# Patient Record
Sex: Female | Born: 1935
Health system: Southern US, Community
[De-identification: ages and names within clinical notes are randomized; demographics above are authoritative.]

## PROBLEM LIST (undated history)

## (undated) DIAGNOSIS — E785 Hyperlipidemia, unspecified: Secondary | ICD-10-CM

## (undated) DIAGNOSIS — G5600 Carpal tunnel syndrome, unspecified upper limb: Secondary | ICD-10-CM

## (undated) DIAGNOSIS — I2510 Atherosclerotic heart disease of native coronary artery without angina pectoris: Secondary | ICD-10-CM

## (undated) DIAGNOSIS — C4490 Unspecified malignant neoplasm of skin, unspecified: Secondary | ICD-10-CM

## (undated) DIAGNOSIS — I10 Essential (primary) hypertension: Secondary | ICD-10-CM

## (undated) DIAGNOSIS — M1990 Unspecified osteoarthritis, unspecified site: Secondary | ICD-10-CM

## (undated) HISTORY — DX: Atherosclerotic heart disease of native coronary artery without angina pectoris: I25.10

## (undated) HISTORY — DX: Essential (primary) hypertension: I10

## (undated) HISTORY — PX: PARTIAL HYSTERECTOMY: SHX80

## (undated) HISTORY — DX: Unspecified malignant neoplasm of skin, unspecified: C44.90

## (undated) HISTORY — DX: Carpal tunnel syndrome, unspecified upper limb: G56.00

## (undated) HISTORY — PX: BLADDER SURGERY: SHX569

## (undated) HISTORY — PX: CATARACT EXTRACTION: SUR2

## (undated) HISTORY — DX: Hyperlipidemia, unspecified: E78.5

---

## 1995-09-23 DIAGNOSIS — I10 Essential (primary) hypertension: Secondary | ICD-10-CM

## 1995-09-23 HISTORY — DX: Essential (primary) hypertension: I10

## 1997-03-04 HISTORY — PX: CARDIAC CATHETERIZATION: SHX172

## 1997-07-05 LAB — HM SIGMOIDOSCOPY: HM Sigmoidoscopy: NORMAL

## 1997-08-04 DIAGNOSIS — E785 Hyperlipidemia, unspecified: Secondary | ICD-10-CM

## 1997-08-04 HISTORY — DX: Hyperlipidemia, unspecified: E78.5

## 1997-08-26 ENCOUNTER — Encounter: Payer: Self-pay | Admitting: Family Medicine

## 1997-08-26 LAB — CONVERTED CEMR LAB: Pap Smear: NORMAL

## 1999-07-19 ENCOUNTER — Encounter: Admission: RE | Admit: 1999-07-19 | Discharge: 1999-08-03 | Payer: Self-pay | Admitting: Pain Medicine

## 1999-07-26 ENCOUNTER — Ambulatory Visit (HOSPITAL_COMMUNITY): Admission: RE | Admit: 1999-07-26 | Discharge: 1999-07-26 | Payer: Self-pay | Admitting: Orthopedic Surgery

## 2000-01-11 ENCOUNTER — Encounter: Admission: RE | Admit: 2000-01-11 | Discharge: 2000-01-11 | Payer: Self-pay | Admitting: General Surgery

## 2000-01-11 ENCOUNTER — Encounter: Payer: Self-pay | Admitting: General Surgery

## 2000-03-04 HISTORY — PX: BIOPSY BREAST: PRO8

## 2000-03-05 ENCOUNTER — Encounter: Payer: Self-pay | Admitting: Family Medicine

## 2000-03-05 ENCOUNTER — Other Ambulatory Visit: Admission: RE | Admit: 2000-03-05 | Discharge: 2000-03-05 | Payer: Self-pay | Admitting: Family Medicine

## 2000-03-05 LAB — CONVERTED CEMR LAB: Pap Smear: NORMAL

## 2000-03-19 ENCOUNTER — Encounter: Payer: Self-pay | Admitting: General Surgery

## 2000-03-19 ENCOUNTER — Encounter: Admission: RE | Admit: 2000-03-19 | Discharge: 2000-03-19 | Payer: Self-pay | Admitting: General Surgery

## 2000-04-01 ENCOUNTER — Encounter: Payer: Self-pay | Admitting: General Surgery

## 2000-04-01 ENCOUNTER — Encounter: Admission: RE | Admit: 2000-04-01 | Discharge: 2000-04-01 | Payer: Self-pay | Admitting: General Surgery

## 2000-04-02 ENCOUNTER — Ambulatory Visit (HOSPITAL_BASED_OUTPATIENT_CLINIC_OR_DEPARTMENT_OTHER): Admission: RE | Admit: 2000-04-02 | Discharge: 2000-04-02 | Payer: Self-pay | Admitting: General Surgery

## 2000-04-02 ENCOUNTER — Encounter (INDEPENDENT_AMBULATORY_CARE_PROVIDER_SITE_OTHER): Payer: Self-pay | Admitting: Specialist

## 2000-04-14 ENCOUNTER — Encounter: Payer: Self-pay | Admitting: Family Medicine

## 2000-04-14 LAB — CONVERTED CEMR LAB: Hgb A1c MFr Bld: 5.7 %

## 2001-12-01 ENCOUNTER — Other Ambulatory Visit: Admission: RE | Admit: 2001-12-01 | Discharge: 2001-12-01 | Payer: Self-pay | Admitting: Family Medicine

## 2001-12-01 ENCOUNTER — Encounter: Payer: Self-pay | Admitting: Family Medicine

## 2002-01-13 ENCOUNTER — Encounter: Admission: RE | Admit: 2002-01-13 | Discharge: 2002-01-13 | Payer: Self-pay | Admitting: Family Medicine

## 2002-01-13 ENCOUNTER — Encounter: Payer: Self-pay | Admitting: Family Medicine

## 2002-08-02 ENCOUNTER — Ambulatory Visit (HOSPITAL_COMMUNITY): Admission: RE | Admit: 2002-08-02 | Discharge: 2002-08-02 | Payer: Self-pay | Admitting: Ophthalmology

## 2003-01-24 ENCOUNTER — Encounter: Admission: RE | Admit: 2003-01-24 | Discharge: 2003-01-24 | Payer: Self-pay | Admitting: General Surgery

## 2003-01-24 ENCOUNTER — Encounter: Payer: Self-pay | Admitting: Family Medicine

## 2004-01-25 ENCOUNTER — Encounter: Admission: RE | Admit: 2004-01-25 | Discharge: 2004-01-25 | Payer: Self-pay | Admitting: Family Medicine

## 2004-10-05 ENCOUNTER — Ambulatory Visit: Payer: Self-pay | Admitting: Family Medicine

## 2004-11-06 ENCOUNTER — Ambulatory Visit: Payer: Self-pay | Admitting: Family Medicine

## 2005-02-07 ENCOUNTER — Encounter: Admission: RE | Admit: 2005-02-07 | Discharge: 2005-02-07 | Payer: Self-pay | Admitting: Family Medicine

## 2005-05-28 ENCOUNTER — Ambulatory Visit: Payer: Self-pay | Admitting: Family Medicine

## 2005-06-03 ENCOUNTER — Ambulatory Visit: Payer: Self-pay | Admitting: Family Medicine

## 2005-07-01 ENCOUNTER — Ambulatory Visit: Payer: Self-pay | Admitting: Family Medicine

## 2005-09-06 ENCOUNTER — Ambulatory Visit: Payer: Self-pay | Admitting: Family Medicine

## 2005-11-07 ENCOUNTER — Ambulatory Visit: Payer: Self-pay | Admitting: Family Medicine

## 2005-12-03 ENCOUNTER — Ambulatory Visit: Payer: Self-pay | Admitting: Family Medicine

## 2006-02-25 ENCOUNTER — Encounter: Admission: RE | Admit: 2006-02-25 | Discharge: 2006-02-25 | Payer: Self-pay | Admitting: Family Medicine

## 2006-06-25 ENCOUNTER — Ambulatory Visit: Payer: Self-pay | Admitting: Family Medicine

## 2006-07-03 ENCOUNTER — Ambulatory Visit: Payer: Self-pay | Admitting: Cardiology

## 2006-07-09 ENCOUNTER — Ambulatory Visit: Payer: Self-pay

## 2006-08-13 ENCOUNTER — Ambulatory Visit: Payer: Self-pay | Admitting: Family Medicine

## 2007-03-25 ENCOUNTER — Encounter: Admission: RE | Admit: 2007-03-25 | Discharge: 2007-03-25 | Payer: Self-pay | Admitting: Family Medicine

## 2007-03-26 ENCOUNTER — Encounter: Payer: Self-pay | Admitting: Family Medicine

## 2007-05-13 ENCOUNTER — Encounter: Payer: Self-pay | Admitting: Family Medicine

## 2007-05-13 DIAGNOSIS — Z87898 Personal history of other specified conditions: Secondary | ICD-10-CM | POA: Insufficient documentation

## 2007-05-13 DIAGNOSIS — I2510 Atherosclerotic heart disease of native coronary artery without angina pectoris: Secondary | ICD-10-CM | POA: Insufficient documentation

## 2007-05-13 DIAGNOSIS — I10 Essential (primary) hypertension: Secondary | ICD-10-CM | POA: Insufficient documentation

## 2007-05-13 DIAGNOSIS — E785 Hyperlipidemia, unspecified: Secondary | ICD-10-CM | POA: Insufficient documentation

## 2007-05-13 DIAGNOSIS — Z8739 Personal history of other diseases of the musculoskeletal system and connective tissue: Secondary | ICD-10-CM | POA: Insufficient documentation

## 2007-05-13 DIAGNOSIS — M109 Gout, unspecified: Secondary | ICD-10-CM | POA: Insufficient documentation

## 2007-05-13 DIAGNOSIS — Q838 Other congenital malformations of breast: Secondary | ICD-10-CM | POA: Insufficient documentation

## 2007-05-14 ENCOUNTER — Ambulatory Visit: Payer: Self-pay | Admitting: Family Medicine

## 2007-09-04 ENCOUNTER — Ambulatory Visit: Payer: Self-pay | Admitting: Family Medicine

## 2007-09-04 DIAGNOSIS — M5430 Sciatica, unspecified side: Secondary | ICD-10-CM | POA: Insufficient documentation

## 2007-11-26 ENCOUNTER — Encounter: Payer: Self-pay | Admitting: Family Medicine

## 2008-03-25 ENCOUNTER — Encounter: Admission: RE | Admit: 2008-03-25 | Discharge: 2008-03-25 | Payer: Self-pay | Admitting: Family Medicine

## 2008-03-30 ENCOUNTER — Encounter (INDEPENDENT_AMBULATORY_CARE_PROVIDER_SITE_OTHER): Payer: Self-pay | Admitting: *Deleted

## 2008-06-23 ENCOUNTER — Ambulatory Visit: Payer: Self-pay | Admitting: Family Medicine

## 2008-06-23 DIAGNOSIS — M79609 Pain in unspecified limb: Secondary | ICD-10-CM | POA: Insufficient documentation

## 2008-08-01 ENCOUNTER — Ambulatory Visit: Payer: Self-pay | Admitting: Family Medicine

## 2008-09-22 ENCOUNTER — Ambulatory Visit: Payer: Self-pay | Admitting: Family Medicine

## 2008-12-22 ENCOUNTER — Ambulatory Visit: Payer: Self-pay | Admitting: Family Medicine

## 2009-03-22 ENCOUNTER — Ambulatory Visit: Payer: Self-pay | Admitting: Family Medicine

## 2009-03-27 ENCOUNTER — Encounter: Admission: RE | Admit: 2009-03-27 | Discharge: 2009-03-27 | Payer: Self-pay | Admitting: Family Medicine

## 2009-03-28 ENCOUNTER — Encounter (INDEPENDENT_AMBULATORY_CARE_PROVIDER_SITE_OTHER): Payer: Self-pay | Admitting: *Deleted

## 2009-06-22 ENCOUNTER — Ambulatory Visit: Payer: Self-pay | Admitting: Family Medicine

## 2009-09-12 ENCOUNTER — Ambulatory Visit: Payer: Self-pay | Admitting: Family Medicine

## 2009-09-20 ENCOUNTER — Ambulatory Visit: Payer: Self-pay | Admitting: Family Medicine

## 2009-10-05 ENCOUNTER — Telehealth: Payer: Self-pay | Admitting: Family Medicine

## 2009-12-20 ENCOUNTER — Ambulatory Visit: Payer: Self-pay | Admitting: Family Medicine

## 2009-12-20 LAB — CONVERTED CEMR LAB
CO2: 34 meq/L — ABNORMAL HIGH (ref 19–32)
Chloride: 103 meq/L (ref 96–112)
Creatinine, Ser: 1.4 mg/dL — ABNORMAL HIGH (ref 0.4–1.2)
Glucose, Bld: 148 mg/dL — ABNORMAL HIGH (ref 70–99)
Potassium: 4.1 meq/L (ref 3.5–5.1)
Sodium: 140 meq/L (ref 135–145)

## 2009-12-25 ENCOUNTER — Ambulatory Visit: Payer: Self-pay | Admitting: Family Medicine

## 2009-12-25 DIAGNOSIS — R5383 Other fatigue: Secondary | ICD-10-CM

## 2009-12-25 DIAGNOSIS — R5381 Other malaise: Secondary | ICD-10-CM | POA: Insufficient documentation

## 2010-03-29 ENCOUNTER — Encounter: Admission: RE | Admit: 2010-03-29 | Discharge: 2010-03-29 | Payer: Self-pay | Admitting: Family Medicine

## 2010-03-29 LAB — HM MAMMOGRAPHY

## 2010-04-09 ENCOUNTER — Ambulatory Visit: Payer: Self-pay | Admitting: Family Medicine

## 2010-04-09 LAB — CONVERTED CEMR LAB
ALT: 17 units/L (ref 0–35)
AST: 17 units/L (ref 0–37)
BUN: 25 mg/dL — ABNORMAL HIGH (ref 6–23)
Bilirubin, Direct: 0.1 mg/dL (ref 0.0–0.3)
Calcium: 9.3 mg/dL (ref 8.4–10.5)
Chloride: 104 meq/L (ref 96–112)
Direct LDL: 126.4 mg/dL
Eosinophils Absolute: 0.3 10*3/uL (ref 0.0–0.7)
Hemoglobin: 12.1 g/dL (ref 12.0–15.0)
Lymphocytes Relative: 19.9 % (ref 12.0–46.0)
Lymphs Abs: 1.4 10*3/uL (ref 0.7–4.0)
MCHC: 34.5 g/dL (ref 30.0–36.0)
Monocytes Absolute: 0.8 10*3/uL (ref 0.1–1.0)
Monocytes Relative: 10.9 % (ref 3.0–12.0)
Platelets: 323 10*3/uL (ref 150.0–400.0)
Potassium: 4.5 meq/L (ref 3.5–5.1)
RBC: 3.82 M/uL — ABNORMAL LOW (ref 3.87–5.11)
Total Bilirubin: 0.6 mg/dL (ref 0.3–1.2)
Triglycerides: 251 mg/dL — ABNORMAL HIGH (ref 0.0–149.0)
VLDL: 50.2 mg/dL — ABNORMAL HIGH (ref 0.0–40.0)

## 2010-04-10 LAB — CONVERTED CEMR LAB: Vit D, 25-Hydroxy: 46 ng/mL (ref 30–89)

## 2010-04-11 ENCOUNTER — Ambulatory Visit: Payer: Self-pay | Admitting: Family Medicine

## 2010-06-07 ENCOUNTER — Encounter (INDEPENDENT_AMBULATORY_CARE_PROVIDER_SITE_OTHER): Payer: Self-pay | Admitting: *Deleted

## 2010-08-21 ENCOUNTER — Ambulatory Visit: Payer: Self-pay | Admitting: Family Medicine

## 2010-08-21 DIAGNOSIS — B029 Zoster without complications: Secondary | ICD-10-CM | POA: Insufficient documentation

## 2010-08-29 ENCOUNTER — Telehealth: Payer: Self-pay | Admitting: Family Medicine

## 2010-11-22 ENCOUNTER — Encounter: Payer: Self-pay | Admitting: Family Medicine

## 2010-12-04 NOTE — Assessment & Plan Note (Signed)
Summary: CPX / LFW   Vital Signs:  Patient profile:   77 year old female Weight:      188.25 pounds Temp:     98.8 degrees F oral Pulse rate:   96 / minute Pulse rhythm:   regular BP sitting:   118 / 70  (left arm) Cuff size:   large  Vitals Entered By: Sydell Axon LPN (April 11, 6961 3:00 PM) CC: 30 Minute checkup, has had a hysterectomy   History of Present Illness: Pt here for followup. She is feeling well and has a pain under her right shoulder blade which happens after she pulls weeds, etc.  She gets exhausted very easily. She pulled weeds for about 2 hours from 8-10 this AM, then laid down.  Preventive Screening-Counseling & Management  Alcohol-Tobacco     Alcohol drinks/day: 0     Smoking Status: never  Caffeine-Diet-Exercise     Caffeine use/day: 1-2     Does Patient Exercise: no  Problems Prior to Update: 1)  Fatigue  (ICD-780.79) 2)  Need Prophylactic Vaccination&inoculation Flu  (ICD-V04.81) 3)  Heel Pain, Bilateral  (ICD-729.5) 4)  Sciatica, Left  (ICD-724.3) 5)  Dystrophic Nails, Fingers  (ICD-703.8) 6)  Fibrocystic Breast Disease, Hx of  (ICD-V13.9) 7)  Anomaly, Congenital, Breast Nec  (ICD-757.6) 8)  Cad  (ICD-414.00) 9)  Hx, Personal, Arthritis  (ICD-V13.4) 10)  Hypertension  (ICD-401.9) 11)  Hyperlipidemia  (ICD-272.4) 12)  Gout  (ICD-274.9)  Medications Prior to Update: 1)  Bayer Aspirin 325 Mg  Tabs (Aspirin) .Marland Kitchen.. 1 By Mouth Once Daily 2)  Accupril 40 Mg  Tabs (Quinapril Hcl) .Marland Kitchen.. 1 By Mouth Daily 3)  Bl Vitamin C 500 Mg  Tabs (Ascorbic Acid) .Marland Kitchen.. 1000 Mg. Daily 4)  Caltrate 600 1500 Mg  Tabs (Calcium Carbonate) .... Once Daily   Tums 3 A Day 5)  Cvs Vitamin E 400 Unit  Caps (Vitamin E) .... Once Daily 6)  Thera Tears Nutrition   Caps (Dha-Epa-Flaxseed Oil-Vitamin E) .... 3 Drops Four Times A Day, Left Eye 7)  Bl Flax Seed Oil 1000 Mg  Caps (Flaxseed (Linseed)) .... Once Daily 8)  Omeprazole 20 Mg  Cpdr (Omeprazole) .... One Tab By Mouth 45  Minutes Before Breakfast 9)  Fish Oil   Oil (Fish Oil) .... Two Times A Day 10)  Multivitamins   Tabs (Multiple Vitamin) .... Once Daily 11)  Amlodipine Besylate 10 Mg Tabs (Amlodipine Besylate) .... Take 1/2  By Mouth At Night 12)  Maxzide-25 37.5-25 Mg Tabs (Triamterene-Hctz) .... One Tab By Mouth in Am.  Allergies: No Known Drug Allergies  Family History: Father:  DECEASED 34 YOA CANCER OF COLON Mother:  DECEASED 64 YOA CANCER BREAST/ KIDNEY FAILURE Siblings: 2 BROTHERS : 1 DECEASED BLADDER CANCER 1 BROTHER DECEASED LUNG CANCER 3 SISTERS : 2 SISTER HTN CAD: +BROTHER DM : NEGATIVE  HTN: + SELF SISTER/ BROTHER STROKE : NEGATIVE  CANCER: +FATHER COLON CANCER/ + MOTHER BREAST CANCER BROTHER X 2/ 1 WITH BLADDER CANCER/ 1 BROTHER WITH LUNG  Social History: Smoking Status:  never Caffeine use/day:  1-2 Does Patient Exercise:  no  Review of Systems General:  Denies chills, fatigue, fever, sweats, weakness, and weight loss. Eyes:  Denies blurring, discharge, and eye pain; recent eye exam. ENT:  Denies decreased hearing, earache, and ringing in ears. CV:  Complains of chest pain or discomfort; denies palpitations and shortness of breath with exertion; occas with pulling weeds/grass. Resp:  Denies cough, shortness of  breath, and wheezing. GI:  Denies abdominal pain, bloody stools, change in bowel habits, constipation, dark tarry stools, diarrhea, indigestion, loss of appetite, nausea, vomiting, vomiting blood, and yellowish skin color; well controlled GERD, Rarely takes Omeprazole.. GU:  Complains of nocturia; denies discharge, dysuria, and urinary frequency; 1-2. MS:  Complains of joint pain; denies low back pain, muscle aches, muscle weakness, and stiffness; hands occas.. Derm:  Denies dryness, itching, and rash; sees Derm, just saw him yesterday.. Neuro:  Denies numbness, poor balance, tingling, and tremors.  Physical Exam  General:  Well-developed,well-nourished,in no acute  distress; alert,appropriate and cooperative throughout examination, in good spirits.. Head:  Normocephalic and atraumatic without obvious abnormalities. No apparent alopecia or balding. Sinuses NT. Eyes:  Conjunctiva clear bilaterally.  Ears:  External ear exam shows no significant lesions or deformities.  Otoscopic examination reveals clear canals, tympanic membranes are intact bilaterally without bulging, retraction, inflammation or discharge. Hearing is grossly normal bilaterally. Nose:  External nasal examination shows no deformity or inflammation. Nasal mucosa are pink and moist without lesions or exudates. Mild swelling of left nostril with clear discharge. Mouth:  Oral mucosa and oropharynx without lesions or exudates.  Teeth in good repair. Neck:  No deformities, masses, or tenderness noted. Chest Wall:  No deformities, masses, or tenderness noted. Breasts:  No mass, nodules, thickening, tenderness, bulging, retraction, inflamation, nipple discharge or skin changes noted.   Lungs:  Normal respiratory effort, chest expands symmetrically. Lungs are clear to auscultation, no crackles or wheezes. Heart:  Normal rate and regular rhythm. S1 and S2 normal without gallop, murmur, click, rub or other extra sounds. Abdomen:  Bowel sounds positive,abdomen soft and non-tender without masses, organomegaly or hernias noted. Rectal:  No external abnormalities noted. Normal sphincter tone. No rectal masses or tenderness. G neg. Genitalia:  Bimanual only done Introitus wnl, Uterus and Cervix absent, Adnexa nontender w/o mass, ovaries not felt.  Msk:  No deformity or scoliosis noted of thoracic or lumbar spine.  Mild discomfort with ext rotation of the right shoulder. Pulses:  R and L carotid,radial,femoral,dorsalis pedis and posterior tibial pulses are full and equal bilaterally Extremities:  No clubbing, cyanosis, edema, or deformity noted with normal full range of motion of all joints.   Neurologic:  No  cranial nerve deficits noted. Station and gait are normal. Plantar reflexes are down-going bilaterally. DTRs are symmetrical throughout. Sensory, motor and coordinative functions appear intact. Skin:  Intact without suspicious lesions or rashes Cervical Nodes:  No lymphadenopathy noted Axillary Nodes:  No palpable lymphadenopathy Inguinal Nodes:  No significant adenopathy Psych:  Cognition and judgment appear intact. Alert and cooperative with normal attention span and concentration. No apparent delusions, illusions, hallucinations   Impression & Recommendations:  Problem # 1:  FIBROCYSTIC BREAST DISEASE, HX OF (ICD-V13.9) Recent mammo ok. Breaqst exam today benign.  Problem # 2:  CAD (ICD-414.00)  Sounds stable but may need eval in future, having what sounds like stable angina. Her updated medication list for this problem includes:    Bayer Aspirin 325 Mg Tabs (Aspirin) .Marland Kitchen... 1 by mouth once daily    Accupril 40 Mg Tabs (Quinapril hcl) .Marland Kitchen... 1 by mouth daily    Amlodipine Besylate 10 Mg Tabs (Amlodipine besylate) .Marland Kitchen... Take 1/2  by mouth at night    Maxzide-25 37.5-25 Mg Tabs (Triamterene-hctz) ..... One tab by mouth in am.  Labs Reviewed: Chol: 226 (04/09/2010)   HDL: 36.60 (04/09/2010)   TG: 251.0 (04/09/2010)  Problem # 3:  HX, PERSONAL, ARTHRITIS (ICD-V13.4)  Assessment: Unchanged Generalized aches and pains but nothing worsened acutely.  Problem # 4:  HYPERTENSION (ICD-401.9) Assessment: Unchanged  Stable and well controlled. Her updated medication list for this problem includes:    Accupril 40 Mg Tabs (Quinapril hcl) .Marland Kitchen... 1 by mouth daily    Amlodipine Besylate 10 Mg Tabs (Amlodipine besylate) .Marland Kitchen... Take 1/2  by mouth at night    Maxzide-25 37.5-25 Mg Tabs (Triamterene-hctz) ..... One tab by mouth in am.  BP today: 118/70 Prior BP: 112/68 (12/25/2009)  Labs Reviewed: K+: 4.5 (04/09/2010) Creat: : 1.5 (04/09/2010)   Chol: 226 (04/09/2010)   HDL: 36.60 (04/09/2010)    TG: 251.0 (04/09/2010)  Orders: Prescription Created Electronically [phone])  Problem # 5:  HYPERLIPIDEMIA (ICD-272.4) Assessment: Unchanged  Labs Reviewed: SGOT: 17 (04/09/2010)   SGPT: 17 (04/09/2010)   HDL:36.60 (04/09/2010)  Chol:226 (04/09/2010)  Trig:251.0 (04/09/2010)  LDL 126  Problem # 6:  GOUT (ICD-274.9) Assessment: Unchanged Stable. Elevate extremity; warm compresses, symptomatic relief and medication as directed.   Complete Medication List: 1)  Bayer Aspirin 325 Mg Tabs (Aspirin) .Marland Kitchen.. 1 by mouth once daily 2)  Accupril 40 Mg Tabs (Quinapril hcl) .Marland Kitchen.. 1 by mouth daily 3)  Bl Vitamin C 500 Mg Tabs (Ascorbic acid) .Marland Kitchen.. 1000 mg. daily 4)  Caltrate 600 1500 Mg Tabs (Calcium carbonate) .... Once daily   tums 3 a day 5)  Cvs Vitamin E 400 Unit Caps (Vitamin e) .... Once daily 6)  Thera Tears Nutrition Caps (Dha-epa-flaxseed oil-vitamin e) .... 3 drops daily, left eye 7)  Bl Flax Seed Oil 1000 Mg Caps (Flaxseed (linseed)) .... Once daily 8)  Omeprazole 20 Mg Cpdr (Omeprazole) .... One tab by mouth 45 minutes before breakfast 9)  Fish Oil Oil (Fish oil) .... Two times a day 10)  Multivitamins Tabs (Multiple vitamin) .... Once daily 11)  Amlodipine Besylate 10 Mg Tabs (Amlodipine besylate) .... Take 1/2  by mouth at night 12)  Maxzide-25 37.5-25 Mg Tabs (Triamterene-hctz) .... One tab by mouth in am.  Patient Instructions: 1)  RTC as needed. Prescriptions: MAXZIDE-25 37.5-25 MG TABS (TRIAMTERENE-HCTZ) one tab by mouth in AM.  #90 x 1   Entered by:   Sydell Axon LPN   Authorized by:   Shaune Leeks MD   Signed by:   Sydell Axon LPN on 21/30/8657   Method used:   Electronically to        [facility] [address] (retail)       [address], [address]       [place], [place]  [address]       Ph: [phone]       Fax: [phone]   RxID:   [id number]   Current Allergies (reviewed today): No known allergies

## 2010-12-04 NOTE — Letter (Signed)
Summary: Nadara Eaton letter  [place] [facility]  [address] [place], [place] [address]   Phone: [phone]  Fax: [phone]       06/07/2010 MRN: [id number]  KISHIA SHACKETT [address] [place], [place]  [address]  Dear Ms. [facility] [place] and [place] announce the retirement of Arta Silence, M.D., from full-time practice at the [facility] office effective May 03, 2010 and his plans of returning part-time.  It is important to Dr. Hetty Ely and to our practice that you understand that [facility] has seven physicians in our office for your health care needs.  We will continue to offer the same exceptional care that you have today.    Dr. Hetty Ely has spoken to many of you about his plans for retirement and returning part-time in the fall.   We will continue to work with you through the transition to schedule appointments for you in the office and meet the high standards that [facility] is committed to.   Again, it is with great pleasure that we share the news that Dr. Hetty Ely will return to [facility] in October of 2011 with a reduced schedule.    If you have any questions, or would like to request an appointment with one of our physicians, please call us at [phone] and press the option for Scheduling an appointment.  We take pleasure in providing you with excellent patient care and look forward to seeing you at your next office visit.  Our [facility] Physicians are:  Tillman Abide, M.D. Laurita Quint, M.D. Roxy Manns, M.D. Kerby Nora, M.D. Hannah Beat, M.D. Ruthe Mannan, M.D. We proudly welcomed Raechel Ache, M.D. and Eustaquio Boyden, M.D. to the practice in July/August 2011.  Sincerely,  [facility]

## 2010-12-04 NOTE — Assessment & Plan Note (Signed)
Summary: 2:30 ?SHINGLES/CLE   Vital Signs:  Patient profile:   77 year old female Height:      64 inches Weight:      189.25 pounds BMI:     32.60 Temp:     97.8 degrees F oral Pulse rate:   84 / minute Pulse rhythm:   regular BP sitting:   126 / 74  (left arm) Cuff size:   large  Vitals Entered By: Delilah Shan CMA Duncan Dull) (August 21, 2010 2:42 PM) CC: ? Shingles   History of Present Illness: "Felt it before I saw it."  H/o vaccination for shingles.  Saw the rash last night.  Dermatome distribution on R back and R hemiabdomen.  No FCNAV.  Taking ibuprofen for pain with some relief last night.  Took OTC dose, 1 pill.  "It's not as bad as when I had it before."  Allergies: No Known Drug Allergies  Review of Systems       See HPI.  Otherwise negative.    Physical Exam  General:  A&D NAD RRR CTAB skin with dermatome distribution rash with red lesions noted on R hemi thorax   Impression & Recommendations:  Problem # 1:  SHINGLES (ICD-053.9) Begin valtrex.  Prev vaccination may explain mild symptoms.  Use ibuprofen in meantime with food.  follow up as needed.   Complete Medication List: 1)  Bayer Aspirin 325 Mg Tabs (Aspirin) .Marland Kitchen.. 1 by mouth once daily 2)  Accupril 40 Mg Tabs (Quinapril hcl) .Marland Kitchen.. 1 by mouth daily 3)  Bl Vitamin C 500 Mg Tabs (Ascorbic acid) .Marland Kitchen.. 1000 mg. daily 4)  Caltrate 600 1500 Mg Tabs (Calcium carbonate) .... Once daily   tums 3 a day 5)  Cvs Vitamin E 400 Unit Caps (Vitamin e) .... Once daily 6)  Thera Tears Nutrition Caps (Dha-epa-flaxseed oil-vitamin e) .... 3 drops daily, left eye 7)  Bl Flax Seed Oil 1000 Mg Caps (Flaxseed (linseed)) .... Once daily 8)  Omeprazole 20 Mg Cpdr (Omeprazole) .... One tab by mouth 45 minutes before breakfast 9)  Fish Oil Oil (Fish oil) .... Two times a day 10)  Multivitamins Tabs (Multiple vitamin) .... Once daily 11)  Amlodipine Besylate 10 Mg Tabs (Amlodipine besylate) .... Take 1/2  by mouth at night 12)   Maxzide-25 37.5-25 Mg Tabs (Triamterene-hctz) .... One tab by mouth in am. 13)  Valtrex 1 Gm Tabs (Valacyclovir hcl) .Marland Kitchen.. 1 by mouth three times a day x7d  Patient Instructions: 1)  Take the valtrex three times a day for 7 days.  2)  Take over the counter ibuprofen (200mg  per tab), 2-3 tabs at at time, 2-3 times a day.  Take with food.  3)  This should gradually get better.  Prescriptions: VALTREX 1 GM TABS (VALACYCLOVIR HCL) 1 by mouth three times a day x7d  #21 x 0   Entered and Authorized by:   Crawford Givens MD   Signed by:   Crawford Givens MD on 08/21/2010   Method used:   Electronically to        [facility] [address] (retail)       [address], [address]       [place], [place]  [address]       Ph: [phone]       Fax: [phone]   RxID:   [id number]    Orders Added: 1)  Est. Patient Level III [84132]  Current Allergies (reviewed today): No known allergies

## 2010-12-04 NOTE — Assessment & Plan Note (Signed)
Summary: 3 MONTH FOLLOW UP/RBH   Vital Signs:  Patient profile:   77 year old female Weight:      187 pounds BMI:     32.21 Temp:     98.6 degrees F oral Pulse rate:   80 / minute Pulse rhythm:   regular BP sitting:   112 / 68  (left arm) Cuff size:   large  Vitals Entered By: Sydell Axon LPN (December 25, 2009 2:19 PM) CC: 3 Month follow-up after labs   History of Present Illness: Pt here for followup of BP and increasing diuretic. She has had BP checked at church, 110s/60s. She is feeling all dragged out. Her foot is back to normal. No Probs there. She needs to keep sitting down to get her energy back.  Problems Prior to Update: 1)  Need Prophylactic Vaccination&inoculation Flu  (ICD-V04.81) 2)  Heel Pain, Bilateral  (ICD-729.5) 3)  Sciatica, Left  (ICD-724.3) 4)  Dystrophic Nails, Fingers  (ICD-703.8) 5)  Fibrocystic Breast Disease, Hx of  (ICD-V13.9) 6)  Anomaly, Congenital, Breast Nec  (ICD-757.6) 7)  Cad  (ICD-414.00) 8)  Hx, Personal, Arthritis  (ICD-V13.4) 9)  Hypertension  (ICD-401.9) 10)  Hyperlipidemia  (ICD-272.4) 11)  Gout  (ICD-274.9)  Medications Prior to Update: 1)  Bayer Aspirin 325 Mg  Tabs (Aspirin) .Marland Kitchen.. 1 By Mouth Once Daily 2)  Accupril 40 Mg  Tabs (Quinapril Hcl) .Marland Kitchen.. 1 By Mouth Daily 3)  Bl Vitamin C 500 Mg  Tabs (Ascorbic Acid) .Marland Kitchen.. 1000 Mg. Daily 4)  Caltrate 600 1500 Mg  Tabs (Calcium Carbonate) .... Once Daily   Tums 3 A Day 5)  Cvs Vitamin E 400 Unit  Caps (Vitamin E) .... Once Daily 6)  Thera Tears Nutrition   Caps (Dha-Epa-Flaxseed Oil-Vitamin E) .... 3 Drops Four Times A Day, Left Eye 7)  Bl Flax Seed Oil 1000 Mg  Caps (Flaxseed (Linseed)) .... Once Daily 8)  Omeprazole 20 Mg  Cpdr (Omeprazole) .... One Tab By Mouth 45 Minuetes Before Breakfast 9)  Fish Oil   Oil (Fish Oil) .... Two Times A Day 10)  Multivitamins   Tabs (Multiple Vitamin) .... Once Daily 11)  Amlodipine Besylate 10 Mg Tabs (Amlodipine Besylate) .... Take 1/2  By Mouth At  Night 12)  Maxzide-25 37.5-25 Mg Tabs (Triamterene-Hctz) .... One Tab By Mouth in Am.  Allergies: No Known Drug Allergies  Physical Exam  General:  Well-developed,well-nourished,in no acute distress; alert,appropriate and cooperative throughout examination, good spirits but subdued. Head:  Normocephalic and atraumatic without obvious abnormalities. No apparent alopecia or balding. Eyes:  Conjunctiva clear bilaterally.  Ears:  External ear exam shows no significant lesions or deformities.  Otoscopic examination reveals clear canals, tympanic membranes are intact bilaterally without bulging, retraction, inflammation or discharge. Hearing is grossly normal bilaterally. Nose:  External nasal examination shows no deformity or inflammation. Nasal mucosa are pink and moist without lesions or exudates. Mild swelling of left nostril with clear discharge. Mouth:  Oral mucosa and oropharynx without lesions or exudates.  Teeth in good repair. Neck:  No deformities, masses, or tenderness noted. Lungs:  Normal respiratory effort, chest expands symmetrically. Lungs are clear to auscultation, no crackles or wheezes. Heart:  Normal rate and regular rhythm. S1 and S2 normal without gallop, murmur, click, rub or other extra sounds.   Impression & Recommendations:  Problem # 1:  HYPERTENSION (ICD-401.9) Assessment Improved Cont curr meds. Her updated medication list for this problem includes:    Accupril 40 Mg Tabs (  Quinapril hcl) .Marland Kitchen... 1 by mouth daily    Amlodipine Besylate 10 Mg Tabs (Amlodipine besylate) .Marland Kitchen... Take 1/2  by mouth at night    Maxzide-25 37.5-25 Mg Tabs (Triamterene-hctz) ..... One tab by mouth in am.  BP today: 112/68 Prior BP: 140/80 (09/20/2009)  Labs Reviewed: K+: 4.1 (12/20/2009) Creat: : 1.4 (12/20/2009)     Problem # 2:  HEEL PAIN, BILATERAL (ICD-729.5) Assessment: Improved Doing grezt. Cont.  Problem # 3:  FATIGUE (ICD-780.79) Assessment: New Prob from cold and  congestion but will check Vit D next time.  Complete Medication List: 1)  Bayer Aspirin 325 Mg Tabs (Aspirin) .Marland Kitchen.. 1 by mouth once daily 2)  Accupril 40 Mg Tabs (Quinapril hcl) .Marland Kitchen.. 1 by mouth daily 3)  Bl Vitamin C 500 Mg Tabs (Ascorbic acid) .Marland Kitchen.. 1000 mg. daily 4)  Caltrate 600 1500 Mg Tabs (Calcium carbonate) .... Once daily   tums 3 a day 5)  Cvs Vitamin E 400 Unit Caps (Vitamin e) .... Once daily 6)  Thera Tears Nutrition Caps (Dha-epa-flaxseed oil-vitamin e) .... 3 drops four times a day, left eye 7)  Bl Flax Seed Oil 1000 Mg Caps (Flaxseed (linseed)) .... Once daily 8)  Omeprazole 20 Mg Cpdr (Omeprazole) .... One tab by mouth 45 minutes before breakfast 9)  Fish Oil Oil (Fish oil) .... Two times a day 10)  Multivitamins Tabs (Multiple vitamin) .... Once daily 11)  Amlodipine Besylate 10 Mg Tabs (Amlodipine besylate) .... Take 1/2  by mouth at night 12)  Maxzide-25 37.5-25 Mg Tabs (Triamterene-hctz) .... One tab by mouth in am.  Patient Instructions: 1)  RTC for Comp Exam when avail, labs prior 2)  Check Vit D then.  Current Allergies (reviewed today): No known allergies

## 2010-12-04 NOTE — Progress Notes (Signed)
Summary: shingles   Phone Note Call from Patient Call back at Home Phone [phone]   Caller: Patient Call For: Dr. Para March Summary of Call: Patient was in office t on 10-18 w/ shingles. She says that she is not really any better, having burning from naval around to spine. She is asking if she could get a refill on the valtrex. Uses [facility] [address].  Initial call taken by: Melody Comas,  August 29, 2010 9:15 AM  Follow-up for Phone Call        At this point refilling the valtrex is of limitied value.  we can try it, but if it isn't getting better I want her to come back so we can talk about other options.   Follow-up by: Crawford Givens MD,  August 29, 2010 12:02 PM  Additional Follow-up for Phone Call Additional follow up Details #1::        Line is busy.  Will try again later.  Delilah Shan CMA Duncan Dull)  August 29, 2010 12:13 PM   Patient Advised. Lugene Fuquay CMA Duncan Dull)  August 29, 2010 2:55 PM     Prescriptions: VALTREX 1 GM TABS (VALACYCLOVIR HCL) 1 by mouth three times a day x7d  #21 x 0   Entered and Authorized by:   Crawford Givens MD   Signed by:   Crawford Givens MD on 08/29/2010   Method used:   Electronically to        [facility] [address] (retail)       [address]       [place], [place]  [address]       Ph: [phone]       Fax: [phone]   RxID:   [id number]

## 2010-12-20 NOTE — Letter (Signed)
Summary: Request from [employer] for Information on p  Request from [facility] for Information on pt.-pt refused   Imported By: Beau Fanny 12/12/2010 08:27:59  _____________________________________________________________________  External Attachment:    Type:   Image     Comment:   External Document

## 2011-03-22 NOTE — Assessment & Plan Note (Signed)
[facility]                              CARDIOLOGY OFFICE NOTE   NATAJAH, DERDERIAN                       MRN:          [id number]  DATE:07/03/2006                            DOB:          1926/06/11    I was asked by Dr. Laurita Quint to evaluate Cathy Larsen, a very pleasant  77 year old married, white female from [place], [place], for  shortness of breath and episodes of trying to catch her breath.   We initially evaluated her in 1998.  At that time, she had a heart  catheterization which demonstrated a 40% proximal LAD with no other disease.  She had an abdominal aortogram which showed normal renal vessels.  She had  an ejection fraction of 80%.   Over the last short period of time, she has been having episodes of where  out of the blue she will try to catch her breath.  It is kind of like a  short reflex.  She also has had increased dyspnea on exertion.  She denies  any angina or ischemic symptoms.   Her risk factors are advancing age, known disease, and a history of  hypertension.  She also has hyperlipidemia with LDLs in the 140 but is  intolerant to statins.  She does not smoke.   MEDICATIONS:  Her meds are quinapril 40 mg a day, HCTZ 25 mg a day, aspirin  325 mg a day, fish oil 1,000 mg a day, flax seed oil 1,000 mg a day, and  vitamins.   SOCIAL HISTORY:  She does not drink.  She used to walk on a regular basis  but doesn't now.  She is married.  Her husband has developed Alzheimer's and  becoming a real aggravation.  She has three children.  She has been  retired since 1998.   SURGICAL HISTORY:  She has had a hysterectomy, breast inverted nipple  repair, and cataract implant.   FAMILY HISTORY:  Family history is really noncontributory.   REVIEW OF SYSTEMS:  Negative other than history of present illness.  She  does have a history of gout she says and reflux.   PHYSICAL EXAMINATION:  VITAL SIGNS AND GENERAL:   Her blood pressure is  134/92, her pulse is 86 and regular.  She is 5 foot 3 and weighs 190 pounds.  She is in no acute distress.  HEENT:  Normocephalic and atraumatic.  She wears glasses.  Pupils regular,  round, reactive to light and accommodation.  Extraocular movements are  intact.  The sclerae are clear.  Facial asymmetry is normal.  Dentition is  satisfactory.  Carotids are full without bruits.  NECK:  There is no jugular venous distention and thyroid is not enlarged.  The trachea is midline.  LUNGS:  Clear.  HEART:  Reveals a regular rate and rhythm without murmur or gallop.  ABDOMEN:  Soft.  There is no midline bruit and there is no hepatomegaly.  EXTREMITIES:  Reveal no edema.  Pulses are brisk.  NEUROLOGIC:  Intact.   CARDIOLOGICAL DATA:  Her electrocardiogram is essentially normal except  for  some poor R-wave progression across the anteprecordium which has not changed  since 1998.   ASSESSMENT:  1. Increased dyspnea on exertion with known coronary artery disease.  We      need to rule out progressive obstructive disease.  2. Episodes of not able to catch her breath which sounds like some sort      of neurogenic reflux.  I am not to concerned about this nor do I think      it is cardiac related.  3. Hyperlipidemia, intolerant to statins.  4. Hypertension, under good control.   RECOMMENDATIONS:  Exercise rest test Myoview.  This does not show any  myocardial ischemia and her ejection fraction is normal.  I would offer  simple reassurance at the present time.  Continued secondary prevention is  by Dr. Hetty Ely is reinforced.                               Thomas C. Daleen Squibb, MD, [employer]    TCW/MedQ  DD:  07/03/2006  DT:  07/04/2006  Job #:  [id number]   cc:   Arta Silence, MD

## 2011-03-22 NOTE — Op Note (Signed)
[place]  Patient:    Cathy Larsen, Cathy Larsen                       MRN: [id number] Proc. Date: 04/02/00 Adm. Date:  [id number] Attending:  Janalyn Rouse CC:         Dr. Laurita Quint                           Operative Report  PREOPERATIVE DIAGNOSIS:  Intraductal papilloma of the left breast.  POSTOPERATIVE DIAGNOSIS:  Intraductal papilloma of the left breast.  OPERATION:  Excision of intraductal papilloma of the left breast.  SURGEON:  Rose Phi. Maple Hudson, M.D.  ANESTHESIA:  MAC.  DESCRIPTION OF PROCEDURE:  This patient had a preoperative ductogram done which showed an obstructing lesion, presumably an intraductal papilloma, centered at the 10 oclock position of the duct system of the left breast.  The patient was placed on the operating table and the left breast prepped and draped in the usual fashion.  A curvilinear circumareolar incision was centered at the 10 oclock position which is the area that the ductogram had been done in.  The area was then infiltrated with 1% Xylocaine with adrenalin.  Incision was made and then I elevated the nipple-areolar complex and dissected free from that and then took a large wedge of tissue out of this area.  This should include the intraductal papilloma.  Hemostasis obtained with the cautery.  Subcuticular closure of 4-0 Monocryl and Steri-Strips carried out.  Dressing applied.  The patient transferred to recovery room in satisfactory condition having tolerated the procedure well. DD:  04/02/00 TD:  04/03/00 Job: [id number]

## 2011-03-22 NOTE — Letter (Signed)
June 25, 2006     Dr. Jesse Sans. Wall, MD, FACC  [address], [place]  [address]   RE:  Cathy Larsen, Cathy Larsen  MRN:  [id number]  /  DOB:  05-10-1926   Dear Elijah Birk,   This is to introduce Cathy Larsen, a 77 year old white female who you have  seen many years ago.  She had a catheterization done by Charlies Constable in  1998.  She had a 40% occlusion of the LAD.  She has had episodes of  shortness of breath that is described as having to catch her breath,  sighing, etc, that last for seconds, but happens multiple times a day and is  happening more frequently.  This has been going on, by reviewing my notes,  for years.  Her PFT today was normal, and her lung exam was normal.  Chest x-  ray looked normal to me as well, so I want to make sure that it is not  cardiac in origin.  She does not seem to have chest pressure or progressive  difficulty with exertion.  She has elevated cholesterol.  LDL's have been in  the mid 140 range, typically.  Most recently was 147 with triglycerides of  153 and an HDL of 22.  Her medicines include:  Accupril 40 a day,  hydrochlorothiazide 25 mg a day, Prevacid 30 mg as needed, and Celebrex and  Flexeril p.r.n. muscles aches and pains.  She uses them very infrequently.  She is not allergic to anything.  She has had problems with Crestor in the  past.   I appreciate your seeing her and look forward to your evaluations.   Sincerely,     Arta Silence, MD   RNS/MedQ  DD:  06/25/2006  DT:  06/26/2006  Job #:  [id number]

## 2011-04-18 ENCOUNTER — Telehealth: Payer: Self-pay | Admitting: Family Medicine

## 2011-04-18 DIAGNOSIS — I10 Essential (primary) hypertension: Secondary | ICD-10-CM

## 2011-04-18 NOTE — Telephone Encounter (Signed)
Orders only

## 2011-04-22 ENCOUNTER — Other Ambulatory Visit: Payer: Self-pay | Admitting: Family Medicine

## 2011-04-22 ENCOUNTER — Other Ambulatory Visit (INDEPENDENT_AMBULATORY_CARE_PROVIDER_SITE_OTHER): Payer: Medicare Other | Admitting: Family Medicine

## 2011-04-22 DIAGNOSIS — I10 Essential (primary) hypertension: Secondary | ICD-10-CM

## 2011-04-22 DIAGNOSIS — M109 Gout, unspecified: Secondary | ICD-10-CM

## 2011-04-22 DIAGNOSIS — Z1231 Encounter for screening mammogram for malignant neoplasm of breast: Secondary | ICD-10-CM

## 2011-04-22 LAB — COMPREHENSIVE METABOLIC PANEL
Calcium: 9 mg/dL (ref 8.4–10.5)
Chloride: 101 mEq/L (ref 96–112)
Creatinine, Ser: 1.2 mg/dL (ref 0.4–1.2)
Potassium: 4 mEq/L (ref 3.5–5.1)
Total Bilirubin: 0.6 mg/dL (ref 0.3–1.2)

## 2011-04-22 LAB — URIC ACID: Uric Acid, Serum: 7.4 mg/dL — ABNORMAL HIGH (ref 2.4–7.0)

## 2011-04-22 LAB — LIPID PANEL: VLDL: 39.2 mg/dL (ref 0.0–40.0)

## 2011-04-22 LAB — LDL CHOLESTEROL, DIRECT: Direct LDL: 157.5 mg/dL

## 2011-04-27 ENCOUNTER — Encounter: Payer: Self-pay | Admitting: Family Medicine

## 2011-04-29 ENCOUNTER — Encounter: Payer: Self-pay | Admitting: Family Medicine

## 2011-04-29 ENCOUNTER — Ambulatory Visit (INDEPENDENT_AMBULATORY_CARE_PROVIDER_SITE_OTHER): Payer: Medicare Other | Admitting: Family Medicine

## 2011-04-29 ENCOUNTER — Telehealth: Payer: Self-pay | Admitting: Family Medicine

## 2011-04-29 DIAGNOSIS — I10 Essential (primary) hypertension: Secondary | ICD-10-CM

## 2011-04-29 DIAGNOSIS — M109 Gout, unspecified: Secondary | ICD-10-CM

## 2011-04-29 DIAGNOSIS — Z609 Problem related to social environment, unspecified: Secondary | ICD-10-CM | POA: Insufficient documentation

## 2011-04-29 DIAGNOSIS — Z7289 Other problems related to lifestyle: Secondary | ICD-10-CM

## 2011-04-29 DIAGNOSIS — E785 Hyperlipidemia, unspecified: Secondary | ICD-10-CM

## 2011-04-29 DIAGNOSIS — I2510 Atherosclerotic heart disease of native coronary artery without angina pectoris: Secondary | ICD-10-CM

## 2011-04-29 MED ORDER — QUINAPRIL HCL 40 MG PO TABS: 40.0000 mg | ORAL_TABLET | Freq: Every day | ORAL | Status: DC

## 2011-04-29 MED ORDER — AMLODIPINE BESYLATE 5 MG PO TABS: 5.0000 mg | ORAL_TABLET | Freq: Every day | ORAL | Status: DC

## 2011-04-29 MED ORDER — TRIAMTERENE-HCTZ 37.5-25 MG PO TABS: 1.0000 | ORAL_TABLET | Freq: Every day | ORAL | Status: DC

## 2011-04-29 MED ORDER — OMEPRAZOLE 20 MG PO CPDR: 20.0000 mg | DELAYED_RELEASE_CAPSULE | Freq: Every day | ORAL | Status: DC

## 2011-04-29 NOTE — Assessment & Plan Note (Signed)
Controlled by hx

## 2011-04-29 NOTE — Patient Instructions (Addendum)
I'll check on getting some therapy for your husband.   Don't change your meds for now.  Go see Terri in the lab about the stool cards.   Take care.

## 2011-04-29 NOTE — Progress Notes (Signed)
She's having to care for her husband and this is a strain for her.  Her tennis elbow flared up on the R and she's having to wear a tennis elbow strap for some relief.  He (Brigham,Charlie M) is not walking well after the CVA.  His memory is still problematic.  We talked about options today. She isn't sure how long she can continue like this.  "My kids are talking about what I should do, need to put him somewhere."  They are having to be careful about him leaving the house.   "I don't know the last time I had a good night's sleep."  H/o CAD by cath prev.  See next telephone note.  D/w pt about prevention of progression by risk factor control.   Hypertension:    Using medication without problems or lightheadedness: yes Chest pain with exertion:no Edema:no Short of breath: with extremes of exertion- ie lifting husband, working in the yard in summer Other issues:labs d/w pt. Intolerant of statins prev per pt.   GERD better with diet changes and PPI.    Colon Ca screening- no FH.  Prev did IFOB and she wants to do this again.  She doesn't want colonoscopy given her social situation.    Mammogram- has f/u tomorrow for mammogram.  No masses, lesion, complaints.    S/p hysterectomy.  No need for pap  Meds, vitals, and allergies reviewed.   PMH and SH reviewed  ROS: See HPI.  Otherwise negative.    GEN: nad, alert and oriented HEENT: mucous membranes moist NECK: supple w/o LA CV: rrr. PULM: ctab, no inc wob ABD: soft, +bs EXT: no edema SKIN: no acute rash

## 2011-04-29 NOTE — Telephone Encounter (Signed)
Please call pt.  I looked back at Dr. Vern Claude notes about her normal stress test.  I wouldn't change anything now, other than what we talked about at the OV . Thanks.

## 2011-04-29 NOTE — Assessment & Plan Note (Signed)
No CP, continue risk factor reduction with meds, diet and exercise.  She'll work on low carb diet.

## 2011-04-29 NOTE — Assessment & Plan Note (Signed)
D/w pt about diet and exercise.  No change in meds.

## 2011-04-29 NOTE — Assessment & Plan Note (Signed)
Will attempt to get some help, ie PT, in home for her husband.

## 2011-04-29 NOTE — Assessment & Plan Note (Signed)
Pt to work on diet and exercise. 

## 2011-04-30 ENCOUNTER — Ambulatory Visit
Admission: RE | Admit: 2011-04-30 | Discharge: 2011-04-30 | Disposition: A | Discharge status: Home / Self Care | Payer: Medicare Other | Source: Ambulatory Visit | Attending: Family Medicine | Admitting: Family Medicine

## 2011-04-30 DIAGNOSIS — Z1231 Encounter for screening mammogram for malignant neoplasm of breast: Secondary | ICD-10-CM

## 2011-04-30 NOTE — Telephone Encounter (Signed)
Left message on machine at home for patient to return call. 

## 2011-04-30 NOTE — Telephone Encounter (Signed)
Advised pt

## 2011-05-07 ENCOUNTER — Encounter: Payer: Self-pay | Admitting: *Deleted

## 2011-05-09 ENCOUNTER — Other Ambulatory Visit: Payer: Medicare Other

## 2011-05-09 ENCOUNTER — Other Ambulatory Visit: Payer: Self-pay | Admitting: Family Medicine

## 2011-05-09 DIAGNOSIS — Z1289 Encounter for screening for malignant neoplasm of other sites: Secondary | ICD-10-CM

## 2011-05-09 LAB — FECAL OCCULT BLOOD, IMMUNOCHEMICAL: Fecal Occult Bld: NEGATIVE

## 2011-05-10 ENCOUNTER — Encounter: Payer: Self-pay | Admitting: *Deleted

## 2011-10-08 ENCOUNTER — Other Ambulatory Visit: Payer: Self-pay | Admitting: Dermatology

## 2011-12-05 ENCOUNTER — Encounter: Payer: Self-pay | Admitting: Family Medicine

## 2011-12-05 ENCOUNTER — Ambulatory Visit (INDEPENDENT_AMBULATORY_CARE_PROVIDER_SITE_OTHER): Payer: Medicare Other | Admitting: Family Medicine

## 2011-12-05 DIAGNOSIS — R7309 Other abnormal glucose: Secondary | ICD-10-CM

## 2011-12-05 DIAGNOSIS — R252 Cramp and spasm: Secondary | ICD-10-CM

## 2011-12-05 DIAGNOSIS — R739 Hyperglycemia, unspecified: Secondary | ICD-10-CM

## 2011-12-05 DIAGNOSIS — E162 Hypoglycemia, unspecified: Secondary | ICD-10-CM

## 2011-12-05 NOTE — Patient Instructions (Signed)
I would keep a snack nearby. You can get your results through our phone system.  Follow the instructions on the blue card.  Let me know if this continues.

## 2011-12-05 NOTE — Progress Notes (Signed)
Episodes of feeling weak, happening most days for last few weeks.  Feels better after eating a snack.  She'll feel weak if not eating a snack. Laying down without eating didn't help. When she eats something sweet or salty, she gets better quickly.  Not skipping meals. Mild hyperglycemia on prev labs.  No CP, not SOB.   Had some leg cramps, no sig help with potassium supplement.    Her husband is at [facility].  She goes almost everyday.  This has been a big change for her.  She is able to sleep some now.   PMH and SH reviewed  ROS: See HPI, otherwise noncontributory.  Meds, vitals, and allergies reviewed.   nad ncat Tearful discussing her husband, regains composure.  Mmm rrr ctab abd soft Ext w/o edema

## 2011-12-06 LAB — BASIC METABOLIC PANEL
BUN: 25 mg/dL — ABNORMAL HIGH (ref 6–23)
CO2: 29 mEq/L (ref 19–32)
Calcium: 9.8 mg/dL (ref 8.4–10.5)
Chloride: 103 mEq/L (ref 96–112)
Creatinine, Ser: 1.3 mg/dL — ABNORMAL HIGH (ref 0.4–1.2)

## 2011-12-08 DIAGNOSIS — E119 Type 2 diabetes mellitus without complications: Secondary | ICD-10-CM | POA: Insufficient documentation

## 2011-12-08 NOTE — Assessment & Plan Note (Signed)
Now with A1c if 7.2.  Recheck A1c in 3 months, healthy snacks in meantime. Sx likely related to hypoglycemia.  We discussed the strain of caring for her husband.  She has been caring for him for a long time and she is trying to make the adjustment.

## 2012-03-10 ENCOUNTER — Other Ambulatory Visit: Payer: Self-pay | Admitting: Family Medicine

## 2012-03-10 DIAGNOSIS — E162 Hypoglycemia, unspecified: Secondary | ICD-10-CM

## 2012-03-11 ENCOUNTER — Other Ambulatory Visit (INDEPENDENT_AMBULATORY_CARE_PROVIDER_SITE_OTHER): Payer: Medicare Other

## 2012-03-11 ENCOUNTER — Telehealth: Payer: Self-pay | Admitting: Family Medicine

## 2012-03-11 DIAGNOSIS — R739 Hyperglycemia, unspecified: Secondary | ICD-10-CM

## 2012-03-11 DIAGNOSIS — E162 Hypoglycemia, unspecified: Secondary | ICD-10-CM

## 2012-03-11 NOTE — Telephone Encounter (Signed)
Patient notified. Lab and OV scheduled.

## 2012-03-11 NOTE — Telephone Encounter (Signed)
Notify pt.  Her 3 months sugar average was a little higher this time.  She doesn't need meds for this (based on the labs).  If she has frequent episodes of hypoglycemia, then notify me.  She likely is progressing into diabetes (which can cause swings in her sugar, both low and high).  I would recheck this again in 3 months before an OV.  I would try to avoid eating sweet foods, as this can make the sugar changes (both high and low) worse.  Thanks.

## 2012-04-07 ENCOUNTER — Other Ambulatory Visit: Payer: Self-pay | Admitting: *Deleted

## 2012-04-07 MED ORDER — TRIAMTERENE-HCTZ 37.5-25 MG PO TABS: 1.0000 | ORAL_TABLET | Freq: Every day | ORAL | Status: DC

## 2012-04-07 MED ORDER — AMLODIPINE BESYLATE 5 MG PO TABS: 5.0000 mg | ORAL_TABLET | Freq: Every day | ORAL | Status: DC

## 2012-05-18 ENCOUNTER — Encounter: Payer: Self-pay | Admitting: Family Medicine

## 2012-05-18 ENCOUNTER — Ambulatory Visit (INDEPENDENT_AMBULATORY_CARE_PROVIDER_SITE_OTHER): Payer: Medicare Other | Admitting: Family Medicine

## 2012-05-18 VITALS — BP 160/80 | HR 94 | Temp 97.7°F

## 2012-05-18 DIAGNOSIS — R42 Dizziness and giddiness: Secondary | ICD-10-CM

## 2012-05-18 NOTE — Patient Instructions (Addendum)
Get over the counter meclizine 25mg . Take 1/2 to 1 tab every 8 hours as needed.  It can make you drowsy.

## 2012-05-19 DIAGNOSIS — R42 Dizziness and giddiness: Secondary | ICD-10-CM | POA: Insufficient documentation

## 2012-05-19 NOTE — Progress Notes (Signed)
1 day of vertigo, not presyncopal sx.  Worse after getting out of bed, better after laying supine.  Cycle of sx would repeat after getting out of bed again.  No other focal neuro changes.  No speech or motor changes.  Worse with head turning but not eye tracking.  No med changes.  No FCV but some nausea with the episodes. Better laying down on the exam table.    Meds, vitals, and allergies reviewed.   ROS: See HPI.  Otherwise, noncontributory.  nad ncat Supine on exam table EOMI, no vertigo with testing TM wnl, nasal and OP exam wnl rrr ctab CN 2-12 wnl B, S/S/DTR wnl x4 Vertigo with sitting up and head turn.  No focal neuro changes o/w.

## 2012-05-19 NOTE — Assessment & Plan Note (Signed)
Likely BPV, d/w pt and grandson. Would use meclizine and f/u prn.  This should resolve.  He can drive for her.

## 2012-05-21 ENCOUNTER — Telehealth: Payer: Self-pay

## 2012-05-21 NOTE — Telephone Encounter (Signed)
Pt left v/m; still dizzy when gets up and tries to walk; dizziness slightly better. If lays on back flat in bed is OK, no dizziness. Pt said not able to be out of bed; if gets up needs help and wants to know how low she will need to be on med for inner ear. Tried to call pt back for further info and left v/m for pt to call back.

## 2012-05-21 NOTE — Telephone Encounter (Signed)
LMOVM of home phone. 

## 2012-05-21 NOTE — Telephone Encounter (Signed)
Dr Para March said can take Meclizine as needed for now. If not any better let Dr Para March know consider other options; ENT vs rehab.

## 2012-06-03 ENCOUNTER — Other Ambulatory Visit (INDEPENDENT_AMBULATORY_CARE_PROVIDER_SITE_OTHER): Payer: Medicare Other

## 2012-06-03 DIAGNOSIS — R7309 Other abnormal glucose: Secondary | ICD-10-CM

## 2012-06-03 DIAGNOSIS — R739 Hyperglycemia, unspecified: Secondary | ICD-10-CM

## 2012-06-08 ENCOUNTER — Ambulatory Visit: Payer: Medicare Other | Admitting: Family Medicine

## 2012-06-11 ENCOUNTER — Encounter: Payer: Self-pay | Admitting: Family Medicine

## 2012-06-11 ENCOUNTER — Ambulatory Visit (INDEPENDENT_AMBULATORY_CARE_PROVIDER_SITE_OTHER): Payer: Medicare Other | Admitting: Family Medicine

## 2012-06-11 VITALS — BP 110/70 | HR 92 | Temp 98.2°F | Wt 190.0 lb

## 2012-06-11 DIAGNOSIS — E119 Type 2 diabetes mellitus without complications: Secondary | ICD-10-CM

## 2012-06-11 NOTE — Progress Notes (Signed)
Vertigo is resolved.  She's happy about that.  Back to driving now and doing well.    Diabetes:  no meds Hypoglycemic episodes: very rare now.  This is much improved.  Hyperglycemic episodes:no Feet problems:no Blood Sugars averaging: rarely checked A1c 7.8.   Diet has been altered as her husband has been in rehab.  She's been trying to pack foods that are healthy/low carb.  Low carb diet handout given.    Meds, vitals, and allergies reviewed.   ROS: See HPI.  Otherwise negative.    GEN: nad, alert and oriented HEENT: mucous membranes moist NECK: supple w/o LA CV: rrr. PULM: ctab, no inc wob ABD: soft, +bs EXT: no edema SKIN: no acute rash  Diabetic foot exam: Normal inspection No skin breakdown No calluses  Normal DP pulses Normal sensation to light touch and monofilament Nails normal

## 2012-06-11 NOTE — Patient Instructions (Addendum)
Don't change your meds for now.  Recheck labs in 6 months before a 30 min visit.  Take care.  Try to cut back on potatoes.

## 2012-06-11 NOTE — Assessment & Plan Note (Signed)
D/w pt about diet and weight.  Will recheck A1c in ~6 months.  She agrees.  Diet handout given for low carb ideas.  She agrees.  Would not want to induce hypoglycemia with meds at this point.

## 2012-07-04 ENCOUNTER — Inpatient Hospital Stay (HOSPITAL_COMMUNITY)
Admission: EM | Admit: 2012-07-04 | Discharge: 2012-07-06 | DRG: 069 | Disposition: A | Discharge status: Home / Self Care | Payer: Medicare Other | Attending: Internal Medicine | Admitting: Internal Medicine

## 2012-07-04 ENCOUNTER — Emergency Department (HOSPITAL_COMMUNITY): Payer: Medicare Other

## 2012-07-04 ENCOUNTER — Encounter (HOSPITAL_COMMUNITY): Payer: Self-pay | Admitting: *Deleted

## 2012-07-04 ENCOUNTER — Inpatient Hospital Stay (HOSPITAL_COMMUNITY): Payer: Medicare Other

## 2012-07-04 DIAGNOSIS — I10 Essential (primary) hypertension: Secondary | ICD-10-CM

## 2012-07-04 DIAGNOSIS — Z8739 Personal history of other diseases of the musculoskeletal system and connective tissue: Secondary | ICD-10-CM

## 2012-07-04 DIAGNOSIS — E119 Type 2 diabetes mellitus without complications: Secondary | ICD-10-CM

## 2012-07-04 DIAGNOSIS — R5381 Other malaise: Secondary | ICD-10-CM

## 2012-07-04 DIAGNOSIS — Z609 Problem related to social environment, unspecified: Secondary | ICD-10-CM

## 2012-07-04 DIAGNOSIS — I2510 Atherosclerotic heart disease of native coronary artery without angina pectoris: Secondary | ICD-10-CM

## 2012-07-04 DIAGNOSIS — Z79899 Other long term (current) drug therapy: Secondary | ICD-10-CM

## 2012-07-04 DIAGNOSIS — N179 Acute kidney failure, unspecified: Secondary | ICD-10-CM

## 2012-07-04 DIAGNOSIS — R42 Dizziness and giddiness: Secondary | ICD-10-CM

## 2012-07-04 DIAGNOSIS — Z8673 Personal history of transient ischemic attack (TIA), and cerebral infarction without residual deficits: Secondary | ICD-10-CM

## 2012-07-04 DIAGNOSIS — M79609 Pain in unspecified limb: Secondary | ICD-10-CM

## 2012-07-04 DIAGNOSIS — Z7982 Long term (current) use of aspirin: Secondary | ICD-10-CM

## 2012-07-04 DIAGNOSIS — Z87898 Personal history of other specified conditions: Secondary | ICD-10-CM

## 2012-07-04 DIAGNOSIS — E860 Dehydration: Secondary | ICD-10-CM

## 2012-07-04 DIAGNOSIS — E785 Hyperlipidemia, unspecified: Secondary | ICD-10-CM

## 2012-07-04 DIAGNOSIS — M109 Gout, unspecified: Secondary | ICD-10-CM

## 2012-07-04 DIAGNOSIS — G459 Transient cerebral ischemic attack, unspecified: Principal | ICD-10-CM

## 2012-07-04 DIAGNOSIS — Q838 Other congenital malformations of breast: Secondary | ICD-10-CM

## 2012-07-04 DIAGNOSIS — IMO0001 Reserved for inherently not codable concepts without codable children: Secondary | ICD-10-CM | POA: Diagnosis present

## 2012-07-04 LAB — CBC WITH DIFFERENTIAL/PLATELET
Basophils Absolute: 0 10*3/uL (ref 0.0–0.1)
Basophils Relative: 0 % (ref 0–1)
Eosinophils Absolute: 0.2 10*3/uL (ref 0.0–0.7)
Eosinophils Relative: 2 % (ref 0–5)
HCT: 37.6 % (ref 36.0–46.0)
MCH: 29.5 pg (ref 26.0–34.0)
MCHC: 34.3 g/dL (ref 30.0–36.0)
MCV: 86 fL (ref 78.0–100.0)
Monocytes Absolute: 0.9 10*3/uL (ref 0.1–1.0)
Neutro Abs: 4.9 10*3/uL (ref 1.7–7.7)
RDW: 12.8 % (ref 11.5–15.5)

## 2012-07-04 LAB — URINALYSIS, ROUTINE W REFLEX MICROSCOPIC
Bilirubin Urine: NEGATIVE
Ketones, ur: NEGATIVE mg/dL
Nitrite: NEGATIVE
Protein, ur: NEGATIVE mg/dL
Urobilinogen, UA: 0.2 mg/dL (ref 0.0–1.0)

## 2012-07-04 LAB — CREATININE, SERUM
Creatinine, Ser: 1.54 mg/dL — ABNORMAL HIGH (ref 0.50–1.10)
GFR calc Af Amer: 35 mL/min — ABNORMAL LOW (ref >90)
GFR calc non Af Amer: 30 mL/min — ABNORMAL LOW (ref >90)

## 2012-07-04 LAB — BASIC METABOLIC PANEL
BUN: 30 mg/dL — ABNORMAL HIGH (ref 6–23)
Creatinine, Ser: 1.5 mg/dL — ABNORMAL HIGH (ref 0.50–1.10)
GFR calc Af Amer: 36 mL/min — ABNORMAL LOW (ref >90)
GFR calc non Af Amer: 31 mL/min — ABNORMAL LOW (ref >90)
Glucose, Bld: 188 mg/dL — ABNORMAL HIGH (ref 70–99)

## 2012-07-04 LAB — HEMOGLOBIN A1C
Hgb A1c MFr Bld: 7.8 % — ABNORMAL HIGH (ref <5.7)
Mean Plasma Glucose: 177 mg/dL — ABNORMAL HIGH (ref <117)

## 2012-07-04 LAB — GLUCOSE, CAPILLARY: Glucose-Capillary: 209 mg/dL — ABNORMAL HIGH (ref 70–99)

## 2012-07-04 LAB — CBC
Hemoglobin: 12.8 g/dL (ref 12.0–15.0)
RBC: 4.28 MIL/uL (ref 3.87–5.11)

## 2012-07-04 MED ORDER — PANTOPRAZOLE SODIUM 40 MG PO TBEC
40.0000 mg | DELAYED_RELEASE_TABLET | Freq: Every day | ORAL | Status: DC
  Administered 2012-07-04 – 2012-07-06 (×3): 40 mg via ORAL
  Filled 2012-07-04 (×3): qty 1

## 2012-07-04 MED ORDER — MECLIZINE HCL 25 MG PO TABS
25.0000 mg | ORAL_TABLET | Freq: Three times a day (TID) | ORAL | Status: AC
  Administered 2012-07-04 – 2012-07-05 (×4): 25 mg via ORAL
  Filled 2012-07-04 (×4): qty 1

## 2012-07-04 MED ORDER — AMLODIPINE BESYLATE 5 MG PO TABS
5.0000 mg | ORAL_TABLET | Freq: Every day | ORAL | Status: DC
  Administered 2012-07-04 – 2012-07-06 (×3): 5 mg via ORAL
  Filled 2012-07-04 (×4): qty 1

## 2012-07-04 MED ORDER — LORATADINE 10 MG PO TABS
10.0000 mg | ORAL_TABLET | Freq: Every day | ORAL | Status: DC
  Administered 2012-07-04 – 2012-07-06 (×3): 10 mg via ORAL
  Filled 2012-07-04 (×3): qty 1

## 2012-07-04 MED ORDER — CEFUROXIME AXETIL 500 MG PO TABS
500.0000 mg | ORAL_TABLET | Freq: Two times a day (BID) | ORAL | Status: DC
  Administered 2012-07-04 – 2012-07-06 (×4): 500 mg via ORAL
  Filled 2012-07-04 (×6): qty 1

## 2012-07-04 MED ORDER — ADULT MULTIVITAMIN W/MINERALS CH
1.0000 | ORAL_TABLET | Freq: Every day | ORAL | Status: DC
  Administered 2012-07-05 – 2012-07-06 (×2): 1 via ORAL
  Filled 2012-07-04 (×2): qty 1

## 2012-07-04 MED ORDER — SODIUM CHLORIDE 0.9 % IV SOLN
INTRAVENOUS | Status: DC
  Administered 2012-07-04: 17:00:00 via INTRAVENOUS

## 2012-07-04 MED ORDER — POLYVINYL ALCOHOL 1.4 % OP SOLN
1.0000 [drp] | OPHTHALMIC | Status: DC | PRN
  Filled 2012-07-04: qty 15

## 2012-07-04 MED ORDER — ACETAMINOPHEN 325 MG PO TABS: 650.0000 mg | ORAL_TABLET | ORAL | Status: DC | PRN

## 2012-07-04 MED ORDER — CARBOXYMETHYLCELLULOSE SODIUM 0.25 % OP SOLN: 1.0000 [drp] | OPHTHALMIC | Status: DC | PRN

## 2012-07-04 MED ORDER — ASPIRIN 325 MG PO TABS
325.0000 mg | ORAL_TABLET | Freq: Every day | ORAL | Status: DC
  Administered 2012-07-04 – 2012-07-06 (×3): 325 mg via ORAL
  Filled 2012-07-04 (×3): qty 1

## 2012-07-04 MED ORDER — ASPIRIN 325 MG PO TABS: 325.0000 mg | ORAL_TABLET | Freq: Every day | ORAL | Status: DC

## 2012-07-04 MED ORDER — OMEGA-3-ACID ETHYL ESTERS 1 G PO CAPS
1.0000 g | ORAL_CAPSULE | Freq: Two times a day (BID) | ORAL | Status: DC
  Administered 2012-07-04 – 2012-07-06 (×4): 1 g via ORAL
  Filled 2012-07-04 (×5): qty 1

## 2012-07-04 MED ORDER — ACETAMINOPHEN-CODEINE #3 300-30 MG PO TABS: 1.0000 | ORAL_TABLET | ORAL | Status: DC | PRN

## 2012-07-04 MED ORDER — ENOXAPARIN SODIUM 40 MG/0.4ML ~~LOC~~ SOLN
30.0000 mg | SUBCUTANEOUS | Status: DC
  Administered 2012-07-04: 30 mg via SUBCUTANEOUS
  Filled 2012-07-04 (×2): qty 0.4

## 2012-07-04 NOTE — H&P (Addendum)
History and Physical       Hospital Admission Note Date: 07/04/2012  Patient name: Cathy Larsen Medical record number: [id number] Date of birth: [birth year] Age: 78 y.o. Gender: female PCP: Crawford Givens, MD   Chief Complaint:  Severe headache this morning with room spinning and numbness around the mouth   HPI: Patient is an 78 year old female with a history of TIA x 2 n the past (7-8 years ago), Hypertension, hyperlipidemia, coronary artery disease, gout presented to [facility] ED with complaints of "room spinning", severe headache and numbness around her mouth. History was obtained from the patient who stated that she initially had "room spinning" episode a month ago. Patient was recommended OTC medication by her PCP which did improve her symptoms (? Meclizine). Patient started having similar symptoms again 5 days ago on Monday with room spinning and feeling exhausted. Patient's husband has been in skilled nursing facility for last one year ([facility]) and she has been under a lot of stress due to that. Patient's grandson lives with her. She woke up this morning at 2 AM with severe headache, described as a frontal, radiating to the back, pressure-like, 10/10 with no visual changes. Per patient, she somehow slept after the headache eased off, then woke up at 8 AM having numbness around the right side of her face and mouth. The facial numbness has resolved however she still feels numbness around the right angle of her mouth. She denied any focal weakness of any part of the body. Patient reported that her grandson had showed that she has been  "swaying to the left" in last 1 week and also her voice did not seem the same this morning. Off note, patient is also having congestion, rhinorrhea and ear fullness in last 1 month.  Review of Systems:  Constitutional: Denies fever, chills, appetite change. Patient also felt diaphoretic with the headache and  fatigue.  HEENT: Denies photophobia, eye pain, redness, hearing loss, ear pain, +congestion, sore throat, rhinorrhea, sneezing, mouth sores, trouble swallowing, neck pain, neck stiffness and tinnitus.   Respiratory: Denies SOB, DOE, cough, chest tightness,  and wheezing.   Cardiovascular: Denies chest pain, palpitations and leg swelling.  Gastrointestinal: Denies nausea, vomiting, abdominal pain, diarrhea, constipation, blood in stool and abdominal distention.  Genitourinary: Denies dysuria, urgency, frequency, hematuria, flank pain and difficulty urinating.  Musculoskeletal: Denies myalgias, back pain, joint swelling, arthralgias and gait problem.  Skin: Denies pallor, rash and wound.  Neurological:  please see history of present illness.  Hematological: Denies adenopathy. Easy bruising, personal or family bleeding history  Psychiatric/Behavioral: Denies suicidal ideation, mood changes, confusion, nervousness, sleep disturbance and agitation  Past Medical History: Past Medical History  Diagnosis Date  . Gout 03/1998  . Hyperlipidemia 08/1997  . Hypertension 09/23/1995  . CAD (coronary artery disease)     cath 1998 with 40% lesion in LAD but no other disease History of TIA x2  (per patient 7-8 years ago)    Past Surgical History  Procedure Date  . Vaginal delivery     x 3  . Partial hysterectomy   . Bladder surgery     bladder tack   . Cardiac catheterization 03/1997    40% occulsion lad   . Biopsy breast 03/2000    Ductogram right breast w/ biopsy- obstructing lesion benign by path. (Dr. Maple Hudson)     Medications: Prior to Admission medications   Medication Sig Start Date End Date Taking? Authorizing Provider  acetaminophen (TYLENOL) 500 MG tablet Take 500  mg by mouth every 6 (six) hours as needed. For pain   Yes Historical Provider, MD  amLODipine (NORVASC) 5 MG tablet Take 1 tablet (5 mg total) by mouth daily. 04/07/12 04/07/13 Yes Joaquim Nam, MD  aspirin 325 MG tablet Take 325  mg by mouth daily.     Yes Historical Provider, MD  Carboxymethylcellulose Sodium (THERATEARS OP) Place 2 drops into both eyes daily as needed. For dry eyes   Yes Historical Provider, MD  Flaxseed, Linseed, (BL FLAX SEED OIL) 1000 MG CAPS Take 1,000 mg by mouth daily.    Yes Historical Provider, MD  Ginger, Zingiber officinalis, (GINGER ROOT) 550 MG CAPS Take 1,650 mg by mouth daily.    Yes Historical Provider, MD  Multiple Vitamin (MULTIVITAMIN WITH MINERALS) TABS Take 1 tablet by mouth daily. ALIVE Womens' 50+   Yes Historical Provider, MD  Omega-3 Fatty Acids (FISH OIL) 1000 MG CAPS Take 1,000 mg by mouth 2 (two) times daily.   Yes Historical Provider, MD  omeprazole (PRILOSEC) 20 MG capsule Take 20 mg by mouth daily as needed. For acid reflux   Yes Historical Provider, MD  POTASSIUM PO Take 2 tablets by mouth daily.   Yes Historical Provider, MD  quinapril (ACCUPRIL) 40 MG tablet Take 1 tablet (40 mg total) by mouth daily. 04/29/11  Yes Joaquim Nam, MD  triamterene-hydrochlorothiazide (MAXZIDE-25) 37.5-25 MG per tablet Take 1 tablet by mouth daily. 04/07/12  Yes Joaquim Nam, MD    Allergies:   Allergies  Allergen Reactions  . Statins     Myalgias.     Social History:  reports that she has never smoked. She does not have any smokeless tobacco history on file. Her alcohol and drug histories not on file. She lives at home with her grandson and mostly ambulates without any assistance however she's been using a cane in the last 1 week to avoid falling.  Family History: Family History  Problem Relation Age of Onset  . Cancer Mother     breast   . Kidney failure Mother   . Cancer Father     colon   . Hypertension Sister   . Cancer Brother     bladder   . Diabetes Neg Hx   . Stroke Neg Hx   . Cancer Brother     lung  . Hypertension Sister     Physical Exam: Blood pressure 149/74, pulse 96, temperature 97.7 F (36.5 C), temperature source Oral, resp. rate 16, SpO2  95.00%. General: Alert, awake, oriented x3, in no acute distress. No facial drooping or dysarthria HEENT: normocephalic, atraumatic, anicteric sclera, pink conjunctiva, pupils equal and reactive to light and accomodation, oropharynx clear Neck: supple, no masses or lymphadenopathy, no goiter, no bruits  Heart: Regular rate and rhythm, without murmurs, rubs or gallops. Lungs: Clear to auscultation bilaterally, no wheezing, rales or rhonchi. Abdomen: Soft, nontender, nondistended, positive bowel sounds, no masses. Extremities: No clubbing, cyanosis or edema with positive pedal pulses. Neuro: Grossly intact, no focal neurological deficits, strength 5/5 upper and lower extremities bilaterally. No nystagmus noted. Dix-Hallpike negative. Finger to nose testing negative, 4/5 strength in LLE Psych: alert and oriented x 3, normal mood and affect Skin: no rashes or lesions, warm and dry   LABS on Admission:  Basic Metabolic Panel:  Lab 07/04/12 9811  NA 136  K 4.0  CL 100  CO2 28  GLUCOSE 188*  BUN 30*  CREATININE 1.50*  CALCIUM 9.3  MG --  PHOS --  CBC:  Lab 07/04/12 1040  WBC 7.5  NEUTROABS 4.9  HGB 12.9  HCT 37.6  MCV 86.0  PLT 297   Cardiac Enzymes:  Lab 07/04/12 1040  CKTOTAL --  CKMB --  CKMBINDEX --  TROPONINI <0.30     Radiological Exams on Admission: Dg Chest 2 View  07/04/2012  *RADIOLOGY REPORT*  Clinical Data: Vertigo.  Diaphoresis.  Facial numbness. Hypertension.  Smoker.  CHEST - 2 VIEW  Comparison: Report dated 04/01/2000.  Findings: The cardiac silhouette remains borderline enlarged.  The pulmonary vasculature is mildly prominent.  The lungs are clear. Thoracic spine degenerative changes.  IMPRESSION: Borderline cardiomegaly and mild pulmonary vascular congestion.   Original Report Authenticated By: Darrol Angel, M.D.    Ct Head Wo Contrast  07/04/2012  *RADIOLOGY REPORT*  Clinical Data: Severe right-sided headache which awoke the patient this morning.   CT HEAD WITHOUT CONTRAST  Technique:  Contiguous axial images were obtained from the base of the skull through the vertex without contrast.  Comparison: None.  Findings: Mild to moderate age appropriate cortical and deep atrophy.  Mild changes of small vessel disease white matter diffusely.  No mass lesion.  No midline shift.  No acute hemorrhage or hematoma.  No extra-axial fluid collections.  No evidence of acute infarction.  No skull fracture or other focal osseous abnormality involving the skull.  Visualized paranasal sinuses, bilateral mastoid air cells, and bilateral middle ear cavities well-aerated.  Mild bilateral carotid siphon atherosclerosis.  IMPRESSION:  1.  No acute intracranial abnormality. 2.  Mild to moderate age appropriate generalized atrophy and mild chronic microvascular ischemic changes of the white matter.   Original Report Authenticated By: Arnell Sieving, M.D.     Assessment/Plan  Principal problem  .Vertigo vs  TIA : rule out CVA - Admit to neuro floor, neurochecks, obtain MRI/MRA rule out CVA, 2-D echo, carotid Dopplers  - PTOT eval, will continue full dose aspirin for now, patient is already tolerating diet in the ED - Placed on scheduled meclizine for today, Ceftin, gentle hydration  Active problems .Diabetes mellitus - Obtain hemoglobin A1c, place on sliding scale insulin, she is diet-controlled   .HYPERLIPIDEMIA - Obtain lipid panel, continue with lovaza   .GOUT: Stable   .HYPERTENSION -Hold triamterene/HCTZ and ACEI, provide gentle hydration today   .CAD: Currently stable, no chest pain or shortness of breath  - Continue aspirin, BP control, serial cardiac enzymes   .Dehydration with mild acute renal insufficiency creatinine 1.5  - continue gentle hydration, hold triamterene/HCTZ and lisinopril    DVT prophylaxis: Lovenox   CODE STATUS: Full code   Further plan will depend as patient's clinical course evolves and further radiologic and laboratory  data become available.   Time Spent on Admission: 1 hour   RAI,RIPUDEEP M.D. [facility] 07/04/2012, 2:44 PM Pager: [phone]  If 7PM-7AM, please contact night-coverage [email or web address] Password [email or web address]

## 2012-07-04 NOTE — ED Provider Notes (Signed)
History     CSN: [id number]  Arrival date & time 07/04/12  1003   First MD Initiated Contact with Patient 07/04/12 1017      No chief complaint on file.   (Consider location/radiation/quality/duration/timing/severity/associated sxs/prior treatment) The history is provided by the patient.   patient presents with dizziness. She states that she has had episodes of feeling like the room was spinning or last couple months. She seen her primary care doctor and diagnosed with ear problems. She states that today around 2 in the morning she woke up with her as sided headache and numbness in her right face. No chest pain. She states that she was sweaty. She was given Tylenol at home and now headache has decreased to 3/10. She states that required her mouth still feels numb. No lateralizing numbness or weakness. She states she's been having episodes that are worse when she lays down. It is worse when she moves also. She states that she previously had some nasal drainage. No fevers. No cough. Abdominal pain. No dysuria.  Past Medical History  Diagnosis Date  . Gout 03/1998  . Hyperlipidemia 08/1997  . Hypertension 09/23/1995  . CAD (coronary artery disease)     cath 1998 with 40% lesion in LAD but no other disease    Past Surgical History  Procedure Date  . Vaginal delivery     x 3  . Partial hysterectomy   . Bladder surgery     bladder tack   . Cardiac catheterization 03/1997    40% occulsion lad   . Biopsy breast 03/2000    Ductogram right breast w/ biopsy- obstructing lesion benign by path. (Dr. Maple Hudson)     Family History  Problem Relation Age of Onset  . Cancer Mother     breast   . Kidney failure Mother   . Cancer Father     colon   . Hypertension Sister   . Cancer Brother     bladder   . Diabetes Neg Hx   . Stroke Neg Hx   . Cancer Brother     lung  . Hypertension Sister     History  Substance Use Topics  . Smoking status: Never Smoker   . Smokeless tobacco: Not on  file  . Alcohol Use: No    OB History    Grav Para Term Preterm Abortions TAB SAB Ect Mult Living                  Review of Systems  Constitutional: Positive for diaphoresis. Negative for activity change and appetite change.  HENT: Negative for ear pain, neck stiffness and tinnitus.   Eyes: Negative for pain.  Respiratory: Negative for chest tightness and shortness of breath.   Cardiovascular: Negative for chest pain and leg swelling.  Gastrointestinal: Negative for nausea, vomiting, abdominal pain and diarrhea.  Genitourinary: Negative for flank pain.  Musculoskeletal: Negative for back pain.  Skin: Negative for rash.  Neurological: Positive for dizziness, numbness and headaches. Negative for facial asymmetry and weakness.  Psychiatric/Behavioral: Negative for behavioral problems.    Allergies  Statins  Home Medications   No current outpatient prescriptions on file.  BP 126/63  Pulse 111  Temp 97.7 F (36.5 C) (Oral)  Resp 16  SpO2 97%  Physical Exam  Nursing note and vitals reviewed. Constitutional: She is oriented to person, place, and time. She appears well-developed and well-nourished.  HENT:  Head: Normocephalic and atraumatic.  Right Ear: External ear normal.  Left  Ear: External ear normal.  Eyes: EOM are normal. Pupils are equal, round, and reactive to light.  Neck: Normal range of motion. Neck supple.  Cardiovascular: Normal rate, regular rhythm and normal heart sounds.   No murmur heard. Pulmonary/Chest: Effort normal and breath sounds normal. No respiratory distress. She has no wheezes. She has no rales.  Abdominal: Soft. Bowel sounds are normal. She exhibits no distension. There is no tenderness. There is no rebound and no guarding.  Musculoskeletal: Normal range of motion.  Neurological: She is alert and oriented to person, place, and time. No cranial nerve deficit.       No nystagmus. Finger-nose is intact bilaterally, although maybe slightly  decreased on right.  Skin: Skin is warm and dry.  Psychiatric: She has a normal mood and affect. Her speech is normal.    ED Course  Procedures (including critical care time)  Labs Reviewed  URINALYSIS, ROUTINE W REFLEX MICROSCOPIC - Abnormal; Notable for the following:    APPearance CLOUDY (*)     Hgb urine dipstick TRACE (*)     Leukocytes, UA MODERATE (*)     All other components within normal limits  BASIC METABOLIC PANEL - Abnormal; Notable for the following:    Glucose, Bld 188 (*)     BUN 30 (*)     Creatinine, Ser 1.50 (*)     GFR calc non Af Amer 31 (*)     GFR calc Af Amer 36 (*)     All other components within normal limits  URINE MICROSCOPIC-ADD ON - Abnormal; Notable for the following:    Squamous Epithelial / LPF FEW (*)     Casts GRANULAR CAST (*)     All other components within normal limits  CBC WITH DIFFERENTIAL  TROPONIN I   Dg Chest 2 View  07/04/2012  *RADIOLOGY REPORT*  Clinical Data: Vertigo.  Diaphoresis.  Facial numbness. Hypertension.  Smoker.  CHEST - 2 VIEW  Comparison: Report dated 04/01/2000.  Findings: The cardiac silhouette remains borderline enlarged.  The pulmonary vasculature is mildly prominent.  The lungs are clear. Thoracic spine degenerative changes.  IMPRESSION: Borderline cardiomegaly and mild pulmonary vascular congestion.   Original Report Authenticated By: Darrol Angel, M.D.    Ct Head Wo Contrast  07/04/2012  *RADIOLOGY REPORT*  Clinical Data: Severe right-sided headache which awoke the patient this morning.  CT HEAD WITHOUT CONTRAST  Technique:  Contiguous axial images were obtained from the base of the skull through the vertex without contrast.  Comparison: None.  Findings: Mild to moderate age appropriate cortical and deep atrophy.  Mild changes of small vessel disease white matter diffusely.  No mass lesion.  No midline shift.  No acute hemorrhage or hematoma.  No extra-axial fluid collections.  No evidence of acute infarction.  No  skull fracture or other focal osseous abnormality involving the skull.  Visualized paranasal sinuses, bilateral mastoid air cells, and bilateral middle ear cavities well-aerated.  Mild bilateral carotid siphon atherosclerosis.  IMPRESSION:  1.  No acute intracranial abnormality. 2.  Mild to moderate age appropriate generalized atrophy and mild chronic microvascular ischemic changes of the white matter.   Original Report Authenticated By: Arnell Sieving, M.D.      1. Vertigo     Date: 07/04/2012  Rate: 91  Rhythm: normal sinus rhythm  QRS Axis: normal  Intervals: PR prolonged  ST/T Wave abnormalities: normal  Conduction Disutrbances:none  Narrative Interpretation:   Old EKG Reviewed: none available  MDM  Patient presents with vertigo for the last month or 2. Today she had right headache and right numbness on her face. Only possible numbness his right corner mouth. EKG and labwork is overall reassuring. Creatinine is mildly elevated. Patient will need admission for central vertigo rule out.       Juliet Rude. Rubin Payor, MD 07/04/12 1558

## 2012-07-04 NOTE — ED Notes (Signed)
Woke up 0200 with severe h/a to rt. Side of head and very sweaty; family gave 500 mg of tylenol and now h/a is 3/10 and not diaphoresis. Pt. Fighting an ear infection. Numbness in rt. Cheek resolved but still in rt. Side of mouth (corner).

## 2012-07-05 DIAGNOSIS — R0989 Other specified symptoms and signs involving the circulatory and respiratory systems: Secondary | ICD-10-CM

## 2012-07-05 DIAGNOSIS — R42 Dizziness and giddiness: Secondary | ICD-10-CM

## 2012-07-05 DIAGNOSIS — E119 Type 2 diabetes mellitus without complications: Secondary | ICD-10-CM

## 2012-07-05 DIAGNOSIS — Z8673 Personal history of transient ischemic attack (TIA), and cerebral infarction without residual deficits: Secondary | ICD-10-CM

## 2012-07-05 DIAGNOSIS — I10 Essential (primary) hypertension: Secondary | ICD-10-CM

## 2012-07-05 DIAGNOSIS — G459 Transient cerebral ischemic attack, unspecified: Secondary | ICD-10-CM

## 2012-07-05 DIAGNOSIS — E860 Dehydration: Secondary | ICD-10-CM

## 2012-07-05 DIAGNOSIS — E785 Hyperlipidemia, unspecified: Secondary | ICD-10-CM

## 2012-07-05 DIAGNOSIS — N179 Acute kidney failure, unspecified: Secondary | ICD-10-CM

## 2012-07-05 LAB — HEMOGLOBIN A1C: Mean Plasma Glucose: 174 mg/dL — ABNORMAL HIGH (ref <117)

## 2012-07-05 LAB — LIPID PANEL
Total CHOL/HDL Ratio: 9.7 RATIO
VLDL: 61 mg/dL — ABNORMAL HIGH (ref 0–40)

## 2012-07-05 LAB — BASIC METABOLIC PANEL
BUN: 20 mg/dL (ref 6–23)
CO2: 27 mEq/L (ref 19–32)
Chloride: 99 mEq/L (ref 96–112)
Creatinine, Ser: 1.33 mg/dL — ABNORMAL HIGH (ref 0.50–1.10)
GFR calc Af Amer: 42 mL/min — ABNORMAL LOW (ref >90)
Potassium: 3.9 mEq/L (ref 3.5–5.1)

## 2012-07-05 LAB — GLUCOSE, CAPILLARY
Glucose-Capillary: 155 mg/dL — ABNORMAL HIGH (ref 70–99)
Glucose-Capillary: 207 mg/dL — ABNORMAL HIGH (ref 70–99)
Glucose-Capillary: 152 mg/dL — ABNORMAL HIGH (ref 70–99)
Glucose-Capillary: 123 mg/dL — ABNORMAL HIGH (ref 70–99)

## 2012-07-05 MED ORDER — ENOXAPARIN SODIUM 30 MG/0.3ML ~~LOC~~ SOLN
30.0000 mg | SUBCUTANEOUS | Status: DC
  Administered 2012-07-05: 30 mg via SUBCUTANEOUS
  Filled 2012-07-05 (×2): qty 0.3

## 2012-07-05 MED ORDER — INSULIN ASPART 100 UNIT/ML ~~LOC~~ SOLN: 0.0000 [IU] | Freq: Every day | SUBCUTANEOUS | Status: DC

## 2012-07-05 MED ORDER — INSULIN ASPART 100 UNIT/ML ~~LOC~~ SOLN
0.0000 [IU] | Freq: Three times a day (TID) | SUBCUTANEOUS | Status: DC
  Administered 2012-07-05: 3 [IU] via SUBCUTANEOUS
  Administered 2012-07-05: 5 [IU] via SUBCUTANEOUS
  Administered 2012-07-06: 3 [IU] via SUBCUTANEOUS

## 2012-07-05 MED ORDER — ROPINIROLE HCL 1 MG PO TABS
1.0000 mg | ORAL_TABLET | Freq: Every day | ORAL | Status: DC
  Administered 2012-07-05: 1 mg via ORAL
  Filled 2012-07-05 (×2): qty 1

## 2012-07-05 MED ORDER — GLIPIZIDE 5 MG PO TABS
5.0000 mg | ORAL_TABLET | Freq: Every day | ORAL | Status: DC
  Filled 2012-07-05 (×2): qty 1

## 2012-07-05 NOTE — Progress Notes (Addendum)
Patient ID: LATRISH MOGEL  female  EXB:[id number]    DOB: 29-Feb-1926    DOA: 07/04/2012  PCP: Crawford Givens, MD  Subjective: Patient feels a lot better today, denies any vertigo, chest pain or any shortness of breath. States that the right perioral numbness has resolved completely.  Objective: Weight change:  No intake or output data in the 24 hours ending 07/05/12 1142 Blood pressure 130/70, pulse 92, temperature 98.3 F (36.8 C), temperature source Oral, resp. rate 18, SpO2 96.00%.  Physical Exam: General: Alert and awake, oriented x3, not in any acute distress. HEENT: anicteric sclera, pupils reactive to light and accommodation, EOMI CVS: S1-S2 clear, no murmur rubs or gallops Chest: clear to auscultation bilaterally, no wheezing, rales or rhonchi Abdomen: soft nontender, nondistended, normal bowel sounds, no organomegaly Extremities: no cyanosis, clubbing or edema noted bilaterally Neuro: Cranial nerves II-XII intact, no focal neurological deficits  Lab Results: Basic Metabolic Panel:  Lab 07/05/12 1027 07/04/12 1634 07/04/12 1040  NA 136 -- 136  K 3.9 -- 4.0  CL 99 -- 100  CO2 27 -- 28  GLUCOSE 232* -- 188*  BUN 20 -- 30*  CREATININE 1.33* 1.54* --  CALCIUM 8.8 -- 9.3  MG -- -- --  PHOS -- -- --   CBC:  Lab 07/04/12 1634 07/04/12 1040  WBC 9.1 7.5  NEUTROABS -- 4.9  HGB 12.8 12.9  HCT 36.7 37.6  MCV 85.7 86.0  PLT 319 297   Cardiac Enzymes:  Lab 07/05/12 0508 07/04/12 2145 07/04/12 1634  CKTOTAL -- -- --  CKMB -- -- --  CKMBINDEX -- -- --  TROPONINI <0.30 <0.30 <0.30   BNP: No components found with this basename: POCBNP:2 CBG:  Lab 07/05/12 1127 07/05/12 0657 07/04/12 2306 07/04/12 1920  GLUCAP 207* 155* 169* 209*     Micro Results: No results found for this or any previous visit (from the past 240 hour(s)).  Studies/Results: Dg Chest 2 View  07/04/2012  *RADIOLOGY REPORT*  Clinical Data: Vertigo.  Diaphoresis.  Facial numbness. Hypertension.   Smoker.  CHEST - 2 VIEW  Comparison: Report dated 04/01/2000.  Findings: The cardiac silhouette remains borderline enlarged.  The pulmonary vasculature is mildly prominent.  The lungs are clear. Thoracic spine degenerative changes.  IMPRESSION: Borderline cardiomegaly and mild pulmonary vascular congestion.   Original Report Authenticated By: Darrol Angel, M.D.    Ct Head Wo Contrast  07/04/2012  *RADIOLOGY REPORT*  Clinical Data: Severe right-sided headache which awoke the patient this morning.  CT HEAD WITHOUT CONTRAST  Technique:  Contiguous axial images were obtained from the base of the skull through the vertex without contrast.  Comparison: None.  Findings: Mild to moderate age appropriate cortical and deep atrophy.  Mild changes of small vessel disease white matter diffusely.  No mass lesion.  No midline shift.  No acute hemorrhage or hematoma.  No extra-axial fluid collections.  No evidence of acute infarction.  No skull fracture or other focal osseous abnormality involving the skull.  Visualized paranasal sinuses, bilateral mastoid air cells, and bilateral middle ear cavities well-aerated.  Mild bilateral carotid siphon atherosclerosis.  IMPRESSION:  1.  No acute intracranial abnormality. 2.  Mild to moderate age appropriate generalized atrophy and mild chronic microvascular ischemic changes of the white matter.   Original Report Authenticated By: Arnell Sieving, M.D.    Mri Brain Without Contrast  07/04/2012  *RADIOLOGY REPORT*  Clinical Data:  Headache.  Numbness around the mouth.  Vertigo. Rule out  CVA.  MRI HEAD WITHOUT CONTRAST MRA HEAD WITHOUT CONTRAST  Technique:  Multiplanar, multiecho pulse sequences of the brain and surrounding structures were obtained without intravenous contrast. Angiographic images of the head were obtained using MRA technique without contrast.  Comparison:  CT head without contrast 07/04/2012.  MRI HEAD  Findings:  The diffusion weighted images demonstrate no  evidence for acute or subacute infarction.  Mild generalized atrophy is likely within normal limits for age.  Scattered subcortical T2 and FLAIR hyperintensities are slightly greater than expected for age. No hemorrhage or mass lesion is present.  Flow is present in the major intracranial arteries.  The patient is status post bilateral lens extractions.  The paranasal sinuses and mastoid air cells are clear.  IMPRESSION:  1.  No acute intracranial abnormality. 2.  Atrophy is within normal limits for age. 3.  White matter disease is slightly greater than expected for age. This likely reflects the sequelae of chronic microvascular ischemia.  MRA HEAD  Findings: The internal carotid arteries demonstrate mild irregularity within the cavernous segments bilaterally.  There is no significant stenosis.  There is focal signal loss in the distal left M1 segment, just proximal the bifurcation.  The A1 and M1 segments are otherwise within normal limits.  The anterior communicating artery is patent.  There is mild attenuation of distal MCA branch vessels bilaterally, worse on the left.  There is significant signal loss in the right vertebral artery. This represents a high-grade stenosis or occlusion.  The left vertebral artery is patent.  The left PICA origin is visualized and within normal limits.  The basilar artery is normal.  The left posterior cerebral artery originates from the basilar tip.  The right posterior cerebral artery is of fetal type.  There is moderate signal loss within the PCA branch vessels bilaterally.  IMPRESSION:  1.  High-grade stenosis or occlusion of the right vertebral artery. Although no parenchymal findings were evident, this may account for the patient's symptoms of vertigo. 2.  Moderate stenosis of the distal left M1 segment. 3.  Moderate small vessel disease.  Critical Value/emergent results were called by telephone at the time of interpretation on 07/04/2012 at 11:25 p.m. to Peterson Lombard, R.N., Charge  Nurse for the 4 [place] unit., who verbally acknowledged these results.   Original Report Authenticated By: Jamesetta Orleans. MATTERN, M.D.    Mr Maxine Glenn Head/brain Wo Cm  07/04/2012  *RADIOLOGY REPORT*  Clinical Data:  Headache.  Numbness around the mouth.  Vertigo. Rule out CVA.  MRI HEAD WITHOUT CONTRAST MRA HEAD WITHOUT CONTRAST  Technique:  Multiplanar, multiecho pulse sequences of the brain and surrounding structures were obtained without intravenous contrast. Angiographic images of the head were obtained using MRA technique without contrast.  Comparison:  CT head without contrast 07/04/2012.  MRI HEAD  Findings:  The diffusion weighted images demonstrate no evidence for acute or subacute infarction.  Mild generalized atrophy is likely within normal limits for age.  Scattered subcortical T2 and FLAIR hyperintensities are slightly greater than expected for age. No hemorrhage or mass lesion is present.  Flow is present in the major intracranial arteries.  The patient is status post bilateral lens extractions.  The paranasal sinuses and mastoid air cells are clear.  IMPRESSION:  1.  No acute intracranial abnormality. 2.  Atrophy is within normal limits for age. 3.  White matter disease is slightly greater than expected for age. This likely reflects the sequelae of chronic microvascular ischemia.  MRA HEAD  Findings: The  internal carotid arteries demonstrate mild irregularity within the cavernous segments bilaterally.  There is no significant stenosis.  There is focal signal loss in the distal left M1 segment, just proximal the bifurcation.  The A1 and M1 segments are otherwise within normal limits.  The anterior communicating artery is patent.  There is mild attenuation of distal MCA branch vessels bilaterally, worse on the left.  There is significant signal loss in the right vertebral artery. This represents a high-grade stenosis or occlusion.  The left vertebral artery is patent.  The left PICA origin is visualized  and within normal limits.  The basilar artery is normal.  The left posterior cerebral artery originates from the basilar tip.  The right posterior cerebral artery is of fetal type.  There is moderate signal loss within the PCA branch vessels bilaterally.  IMPRESSION:  1.  High-grade stenosis or occlusion of the right vertebral artery. Although no parenchymal findings were evident, this may account for the patient's symptoms of vertigo. 2.  Moderate stenosis of the distal left M1 segment. 3.  Moderate small vessel disease.  Critical Value/emergent results were called by telephone at the time of interpretation on 07/04/2012 at 11:25 p.m. to Peterson Lombard, R.N., Charge Nurse for the 4 [place] unit., who verbally acknowledged these results.   Original Report Authenticated By: Jamesetta Orleans. MATTERN, M.D.     Medications: Scheduled Meds:   . amLODipine  5 mg Oral Daily  . aspirin  325 mg Oral Daily  . cefUROXime  500 mg Oral BID WC  . enoxaparin (LOVENOX) injection  30 mg Subcutaneous Q24H  . loratadine  10 mg Oral Daily  . meclizine  25 mg Oral TID  . multivitamin with minerals  1 tablet Oral Daily  . omega-3 acid ethyl esters  1 g Oral BID  . pantoprazole  40 mg Oral Daily  . rOPINIRole  1 mg Oral QHS  . DISCONTD: aspirin  325 mg Oral Daily  . DISCONTD: enoxaparin  30 mg Subcutaneous Q24H   Continuous Infusions:   . sodium chloride 75 mL/hr at 07/04/12 1631     Assessment/Plan: Principal Problem:  *TIA (transient ischemic attack): Symptoms completely resolved - MRI of the brain shows no acute intracranial abnormality. MRA shows high-grade stenosis or occlusion of the right vertebral artery. Discussed in detail with Dr. Eilleen Kempf (neurology on call ) who did not recommend any intervention for that as it is not affecting the flow. - Continue aspirin, 2-D echocardiogram, carotid Dopplers today -PT eval done, outpatient vestibular rehabilitation  Active Problems:  HYPERLIPIDEMIA: - Patient is allergic  to statins, continue with lovaza   GOUT: Stable   HYPERTENSION: stable   CAD: continue aspirin and BP control   Vertigo: Continue meclizine, continue ceftin   Diabetes mellitus: Uncontrolled - Obtain hemoglobin A1c, place on sliding scale insulin and glipizide   Dehydration/ Acute renal failure: Improving -  Continue to hold triamterene /HCTZ and lisinopril, continue gentle hydration.   Cloudy Urine: await urine culture, on ceftin    DVT Prophylaxis:  Code Status: FC  Disposition:Hopefully tomorrow   LOS: 1 day   Irisa Grimsley M.D. [facility] 07/05/2012, 11:42 AM Pager: [phone]  If 7PM-7AM, please contact night-coverage [email or web address] Password [email or web address]

## 2012-07-05 NOTE — Progress Notes (Signed)
Physical Therapy Evaluation Patient Details Name: Cathy Larsen MRN: [id number] DOB: Jan 25, 1926 Today's Date: 07/05/2012 Time: 4098-1191 PT Time Calculation (min): 30 min  PT Assessment / Plan / Recommendation Clinical Impression  78 yo female admitted with TIA with full workup underway; Presents at modified independent functional level with mobility, and has lots of family support;   Discussed the possiblity of taking a day of rest may be beneficial to pt;   Also, pt with history of vertigo, what pt calls "inner ear problems" -- asymptomatic now, but pt would benefit from Outpt PT at some point for vestibular eval and possible exercises; She will need a prescription for "Outpatient Physical Therapy for Vestibular Dysfunction: Evaluate and Treat" -- her primary Care Physicaian can give her this script, too    PT Assessment  All further PT needs can be met in the next venue of care    Follow Up Recommendations  Outpatient PT (for vestibular dysfunction when needed)    Barriers to Discharge        Equipment Recommendations  None recommended by PT    Recommendations for Other Services     Frequency      Precautions / Restrictions Precautions Precautions: None   Pertinent Vitals/Pain No specific reports of pain      Mobility  Bed Mobility Bed Mobility: Supine to Sit;Sitting - Scoot to Edge of Bed Supine to Sit: 6: Modified independent (Device/Increase time) Sitting - Scoot to Edge of Bed: 6: Modified independent (Device/Increase time) Details for Bed Mobility Assistance: Some incr time Transfers Transfers: Sit to Stand;Stand to Sit Sit to Stand: 6: Modified independent (Device/Increase time);With upper extremity assist Stand to Sit: 6: Modified independent (Device/Increase time);With upper extremity assist Details for Transfer Assistance: Some incr time Ambulation/Gait Ambulation/Gait Assistance: 5: Supervision;6: Modified independent (Device/Increase time) Ambulation  Distance (Feet): 120 Feet Assistive device: Straight cane;Other (Comment) (also pushing IV pole) Ambulation/Gait Assistance Details: Supervision progressing to modified independent with straight cane; Slow, but steady gait Gait Pattern: Step-through pattern Stairs:  (Pt politely declined) Modified Rankin (Stroke Patients Only) Pre-Morbid Rankin Score: No symptoms Modified Rankin: No significant disability    Exercises     PT Diagnosis: Other (comment) (Vestibular Dysfunction)  PT Problem List:  (Vestibular Dysfunction) PT Treatment Interventions:     PT Goals Acute Rehab PT Goals PT Goal Formulation: With patient  Visit Information  Last PT Received On: 07/05/12 Assistance Needed: +1    Subjective Data  Subjective: Agreeable to amb Patient Stated Goal: home; back to routine   Prior Functioning  Home Living Lives With: Family (grandson) Available Help at Discharge: Family;Available PRN/intermittently Type of Home: House Home Access: Stairs to enter [employer] of Steps: 2 (1+1; Pt reports confidence at being able to manage steps) Entrance Stairs-Rails:  (there is a post she can hold) Home Layout: One level Bathroom Shower/Tub: Health visitor: Handicapped height Bathroom Accessibility: Yes How Accessible: Accessible via walker Home Adaptive Equipment: Grab bars in shower;Shower chair with back;Straight cane;Quad cane Additional Comments: Home had been handicap setup for her husband with dementia, who is now at a SNF Prior Function Level of Independence: Independent;Independent with assistive device(s) Able to Take Stairs?: Yes Driving: Yes Comments: Uses a cane prn, especially when her "inner ear bothers her"; Very helpful family, grandson does a lot of driving for her (especially with relatively recent "inner ear problems"); Pt visits her husband at SNF often twice a day Communication Communication: No difficulties    Cognition  Overall  Cognitive Status: Appears within functional limits for tasks assessed/performed Arousal/Alertness: Awake/alert Orientation Level: Appears intact for tasks assessed Behavior During Session: [facility] for tasks performed    Extremity/Trunk Assessment Right Upper Extremity Assessment RUE ROM/Strength/Tone: [facility] for tasks assessed Left Upper Extremity Assessment LUE ROM/Strength/Tone: [facility] for tasks assessed Right Lower Extremity Assessment RLE ROM/Strength/Tone: [facility] for tasks assessed Left Lower Extremity Assessment LLE ROM/Strength/Tone: WFL for tasks assessed Trunk Assessment Trunk Assessment: Normal   Balance    End of Session PT - End of Session Equipment Utilized During Treatment: Gait belt Activity Tolerance: Patient tolerated treatment well Patient left: in chair;with call bell/phone within reach Nurse Communication: Other (comment) (OK with cane)  GP     Olen Pel [place], [place] [phone]  07/05/2012, 10:30 AM

## 2012-07-05 NOTE — Progress Notes (Signed)
  Echocardiogram 2D Echocardiogram has been performed.  Cathy Larsen 07/05/2012, 1:42 PM

## 2012-07-05 NOTE — Progress Notes (Signed)
VASCULAR LAB PRELIMINARY  PRELIMINARY  PRELIMINARY  PRELIMINARY  Carotid Dopplers completed.    Preliminary report: No right ICA stenosis.  There is 40-59% ICA stenosis, highest end of scale.  Bilateral vertebral artery flow is antegrade.  Cathy Larsen, 07/05/2012, 2:09 PM

## 2012-07-05 NOTE — Progress Notes (Signed)
Occupational Therapy Evaluation Patient Details Name: Cathy Larsen MRN: [id number] DOB: 1926-06-14 Today's Date: 07/05/2012 Time: 4098-1191 OT Time Calculation (min): 17 min  OT Assessment / Plan / Recommendation Clinical Impression  78 yo s/p ? TIA. All symtoms have resolved. Pt at baseline level of functioning with ADL and mobility for ADL. Agree with PT rec for outpt vestibulqr clinic. Rec intermittent supervision for D/C home. no further OT needs.    OT Assessment  Patient does not need any further OT services    Follow Up Recommendations  No OT follow up    Barriers to Discharge  none    Equipment Recommendations  None recommended by OT    Recommendations for Other Services  Outpt vestibular clinic  Frequency    eval only   Precautions / Restrictions Precautions Precautions: None   Pertinent Vitals/Pain none    ADL  Grooming: Simulated;Independent Where Assessed - Grooming: Unsupported sitting Upper Body Bathing: Simulated;Independent Where Assessed - Upper Body Bathing: Unsupported sitting Lower Body Bathing: Simulated;Modified independent Where Assessed - Lower Body Bathing: Unsupported sit to stand Upper Body Dressing: Simulated;Independent Where Assessed - Upper Body Dressing: Unsupported sitting Lower Body Dressing: Simulated;Modified independent Where Assessed - Lower Body Dressing: Unsupported sit to stand Toilet Transfer: Simulated;Modified independent Toilet Transfer Method: Sit to stand;Stand pivot Acupuncturist: Other (comment) (bed - chair) Toileting - Clothing Manipulation and Hygiene: Simulated;Independent Where Assessed - Toileting Clothing Manipulation and Hygiene: Standing Transfers/Ambulation Related to ADLs: mod I ADL Comments: Pt at baseline level of functioning with all ADL and mobility for ADL    OT Diagnosis:    OT Problem List:   OT Treatment Interventions:     OT Goals Acute Rehab OT Goals OT Goal Formulation:  (eval  only)  Visit Information  Last OT Received On: 07/05/12 Assistance Needed: +1    Subjective Data      Prior Functioning  Vision/Perception  Home Living Lives With: Family (grandson) Available Help at Discharge: Family;Available PRN/intermittently Type of Home: House Home Access: Stairs to enter [employer] of Steps: 2 (1+1; Pt reports confidence at being able to manage steps) Entrance Stairs-Rails:  (there is a post she can hold) Home Layout: One level Bathroom Shower/Tub: Health visitor: Handicapped height Bathroom Accessibility: Yes How Accessible: Accessible via walker Home Adaptive Equipment: Grab bars in shower;Shower chair with back;Straight cane;Quad cane Additional Comments: Home had been handicap setup for her husband with dementia, who is now at a SNF Prior Function Level of Independence: Independent;Independent with assistive device(s) Able to Take Stairs?: Yes Driving: Yes Comments: uses a cane occasionally Communication Communication: No difficulties Dominant Hand: Right   Vision - Assessment Eye Alignment: Within Functional Limits Perception Perception: Within Functional Limits Praxis Praxis: Intact  Cognition  Overall Cognitive Status: Appears within functional limits for tasks assessed/performed Arousal/Alertness: Awake/alert Orientation Level: Appears intact for tasks assessed Behavior During Session: [facility] for tasks performed    Extremity/Trunk Assessment Right Upper Extremity Assessment RUE ROM/Strength/Tone: Within functional levels Left Upper Extremity Assessment LUE ROM/Strength/Tone: Within functional levels Trunk Assessment Trunk Assessment: Normal   Mobility  Shoulder Instructions  Bed Mobility Bed Mobility: Supine to Sit;Sit to Supine Supine to Sit: 7: Independent;HOB flat Sitting - Scoot to Edge of Bed: 7: Independent Sit to Supine: 7: Independent;HOB flat Transfers Transfers: Sit to Stand;Stand to  Sit Sit to Stand: 7: Independent;With upper extremity assist;From bed Stand to Sit: 7: Independent;With upper extremity assist;To bed;To chair/3-in-1       Exercise  Balance  WFL   End of Session OT - End of Session Activity Tolerance: Patient tolerated treatment well Patient left: in bed;with call bell/phone within reach;with family/visitor present  GO     Laneya Gasaway,HILLARY 07/05/2012, 6:41 PM [facility], OTR/L  [phone] 07/05/2012

## 2012-07-06 DIAGNOSIS — R42 Dizziness and giddiness: Secondary | ICD-10-CM

## 2012-07-06 DIAGNOSIS — N179 Acute kidney failure, unspecified: Secondary | ICD-10-CM

## 2012-07-06 LAB — BASIC METABOLIC PANEL
Calcium: 9 mg/dL (ref 8.4–10.5)
Creatinine, Ser: 1.28 mg/dL — ABNORMAL HIGH (ref 0.50–1.10)
GFR calc Af Amer: 44 mL/min — ABNORMAL LOW (ref >90)
GFR calc non Af Amer: 38 mL/min — ABNORMAL LOW (ref >90)
Sodium: 136 mEq/L (ref 135–145)

## 2012-07-06 MED ORDER — LORATADINE 10 MG PO TABS: 10.0000 mg | ORAL_TABLET | Freq: Every day | ORAL | Status: DC

## 2012-07-06 MED ORDER — CEFUROXIME AXETIL 500 MG PO TABS: 500.0000 mg | ORAL_TABLET | Freq: Two times a day (BID) | ORAL | Status: AC

## 2012-07-06 MED ORDER — MECLIZINE HCL 12.5 MG PO TABS: 12.5000 mg | ORAL_TABLET | Freq: Three times a day (TID) | ORAL | Status: AC | PRN

## 2012-07-06 MED ORDER — GLIPIZIDE 5 MG PO TABS: 5.0000 mg | ORAL_TABLET | Freq: Every day | ORAL | Status: DC

## 2012-07-06 MED ORDER — ROPINIROLE HCL 1 MG PO TABS: 1.0000 mg | ORAL_TABLET | Freq: Every day | ORAL | Status: DC

## 2012-07-06 MED ORDER — GLIPIZIDE 5 MG PO TABS
5.0000 mg | ORAL_TABLET | Freq: Every day | ORAL | Status: DC
  Administered 2012-07-06: 5 mg via ORAL
  Filled 2012-07-06 (×2): qty 1

## 2012-07-06 MED ORDER — MECLIZINE HCL 12.5 MG PO TABS
12.5000 mg | ORAL_TABLET | Freq: Three times a day (TID) | ORAL | Status: DC | PRN
  Filled 2012-07-06: qty 1

## 2012-07-06 MED ORDER — UNABLE TO FIND: Status: DC

## 2012-07-06 NOTE — Discharge Summary (Signed)
Physician Discharge Summary  Patient ID: Cathy Larsen MRN: [id number] DOB/AGE: [birth year] 76 y.o.  Admit date: 07/04/2012 Discharge date: 07/06/2012  Primary Care Physician:  Crawford Givens, MD  Discharge Diagnoses:    .Vertigo improved  .Diabetes mellitus .HYPERLIPIDEMIA .GOUT .HYPERTENSION .CAD .Dehydration .TIA (transient ischemic attack) resolved  .Acute renal failure improved  Consults:  None  Discharge Medications: Medication List  As of 07/06/2012  8:21 AM   STOP taking these medications         POTASSIUM PO      triamterene-hydrochlorothiazide 37.5-25 MG per tablet         TAKE these medications         acetaminophen 500 MG tablet   Commonly known as: TYLENOL   Take 500 mg by mouth every 6 (six) hours as needed. For pain      amLODipine 5 MG tablet   Commonly known as: NORVASC   Take 1 tablet (5 mg total) by mouth daily.      aspirin 325 MG tablet   Take 325 mg by mouth daily.      BL FLAX SEED OIL 1000 MG Caps   Take 1,000 mg by mouth daily.      cefUROXime 500 MG tablet   Commonly known as: CEFTIN   Take 1 tablet (500 mg total) by mouth 2 (two) times daily with a meal. X 10 days      Fish Oil 1000 MG Caps   Take 1,000 mg by mouth 2 (two) times daily.      Ginger Root 550 MG Caps   Take 1,650 mg by mouth daily.      glipiZIDE 5 MG tablet   Commonly known as: GLUCOTROL   Take 1 tablet (5 mg total) by mouth daily before breakfast.      loratadine 10 MG tablet   Commonly known as: CLARITIN   Take 1 tablet (10 mg total) by mouth daily.      meclizine 12.5 MG tablet   Commonly known as: ANTIVERT   Take 1 tablet (12.5 mg total) by mouth 3 (three) times daily as needed for dizziness or nausea (vertigo).      multivitamin with minerals Tabs   Take 1 tablet by mouth daily. ALIVE Womens' 50+      omeprazole 20 MG capsule   Commonly known as: PRILOSEC   Take 20 mg by mouth daily as needed. For acid reflux      quinapril 40 MG tablet   Commonly  known as: ACCUPRIL   Take 1 tablet (40 mg total) by mouth daily.      rOPINIRole 1 MG tablet   Commonly known as: REQUIP   Take 1 tablet (1 mg total) by mouth at bedtime.      THERATEARS OP   Place 2 drops into both eyes daily as needed. For dry eyes      UNABLE TO FIND   Outpatient Vestibular therapy      Diagnosis: vertigo             Brief H and P: For complete details please refer to admission H and P, but in brief Patient is an 78 year old female with a history of TIA x 2 n the past (7-8 years ago), Hypertension, hyperlipidemia, coronary artery disease, gout presented to [facility] ED with complaints of "room spinning", severe headache and numbness around her mouth. History was obtained from the patient who stated that she initially had "room spinning" episode a month ago. Patient  was recommended OTC medication by her PCP which did improve her symptoms (? Meclizine). Patient started having similar symptoms again 5 days ago on Monday with room spinning and feeling exhausted. Patient's husband has been in skilled nursing facility for last one year ([facility]) and she has been under a lot of stress due to that. Patient's grandson lives with her. She woke up on 07/04/2012 at 2 AM with severe headache, described as a frontal, radiating to the back, pressure-like, 10/10 with no visual changes. Per patient, she somehow slept after the headache eased off, then woke up at 8 AM having numbness around the right side of her face and mouth. The facial numbness had resolved however she she still felt numbness around the right angle of her mouth. She denied any focal weakness of any part of the body. Patient reported that her grandson had showed that she has been "swaying to the left" in last 1 week and also her voice did not seem the same on the morning of admission. . Off note, patient is also having congestion, rhinorrhea and ear fullness in last 1 month.   Hospital Course:  Patient is an  78 year old female with a history of TIA x 2 n the past (7-8 years ago), Hypertension, hyperlipidemia, coronary artery disease, gout presented to [facility] ED with complaints of "room spinning", severe headache and numbness around her mouth. Patient was admitted for TIA and possible vertigo. TIA (transient ischemic attack): Symptoms completely resolved at discharge.  MRI of the brain shows no acute intracranial abnormality. MRA shows high-grade stenosis or occlusion of the right vertebral artery. I discussed in detail with Dr. Eilleen Kempf (neurology on call ) who did not recommend any intervention for that as it is not affecting the flow. Patient was continued on aspirin. 2-D echo showed EF of 65-70%, grade 1 diastolic dysfunction. Carotid Doppler showed 40-59% ICA stenosis on left but no ICA stenosis on right. PT recommended outpatient vestibular rehabilitation.  HYPERLIPIDEMIA: Patient stated that she is allergic to statins since she was continued on lovaza.  CAD: continue aspirin and BP control   Vertigo: Continue meclizine, continue ceftin   Diabetes mellitus: Uncontrolled, hemoglobin A1c was 7.7. Patient was recommended for carb modified diet and low-dose oral hypoglycemic. She was placed on glipizide daily, refill for outpatient diabetic education was set up through case management.   Dehydration/ Acute renal failure: Improving  -Discontinued triamterene /HCTZ. Patient's blood pressure has remained stable, I did restart her lisinopril at discharge, however if at her followup appointment if creatinine is elevated, suggest to decrease her lisinopril as well.      Day of Discharge BP 127/71  Pulse 87  Temp 98.1 F (36.7 C) (Oral)  Resp 18  SpO2 94%  Physical Exam: General: Alert and awake oriented x3 not in any acute distress. HEENT: anicteric sclera, pupils reactive to light and accommodation CVS: S1-S2 clear no murmur rubs or gallops Chest: clear to auscultation bilaterally, no wheezing rales  or rhonchi Abdomen: soft nontender, nondistended, normal bowel sounds, no organomegaly Extremities: no cyanosis, clubbing or edema noted bilaterally Neuro: Cranial nerves II-XII intact, no focal neurological deficits   The results of significant diagnostics from this hospitalization (including imaging, microbiology, ancillary and laboratory) are listed below for reference.    LAB RESULTS: Basic Metabolic Panel:  Lab 07/06/12 1610 07/05/12 0846  NA 136 136  K 4.0 3.9  CL 100 99  CO2 24 27  GLUCOSE 152* 232*  BUN 19 20  CREATININE 1.28*  1.33*  CALCIUM 9.0 8.8  MG -- --  PHOS -- --   CBC:  Lab 07/04/12 1634 07/04/12 1040  WBC 9.1 7.5  NEUTROABS -- 4.9  HGB 12.8 12.9  HCT 36.7 37.6  MCV 85.7 --  PLT 319 297   Cardiac Enzymes:  Lab 07/05/12 0508 07/04/12 2145  CKTOTAL -- --  CKMB -- --  CKMBINDEX -- --  TROPONINI <0.30 <0.30   BNP: No components found with this basename: POCBNP:2 CBG:  Lab 07/06/12 0642 07/05/12 2141  GLUCAP 157* 123*    Significant Diagnostic Studies:  Dg Chest 2 View  07/04/2012  *RADIOLOGY REPORT*  Clinical Data: Vertigo.  Diaphoresis.  Facial numbness. Hypertension.  Smoker.  CHEST - 2 VIEW  Comparison: Report dated 04/01/2000.  Findings: The cardiac silhouette remains borderline enlarged.  The pulmonary vasculature is mildly prominent.  The lungs are clear. Thoracic spine degenerative changes.  IMPRESSION: Borderline cardiomegaly and mild pulmonary vascular congestion.   Original Report Authenticated By: Darrol Angel, M.D.    Ct Head Wo Contrast  07/04/2012  *RADIOLOGY REPORT*  Clinical Data: Severe right-sided headache which awoke the patient this morning.  CT HEAD WITHOUT CONTRAST  Technique:  Contiguous axial images were obtained from the base of the skull through the vertex without contrast.  Comparison: None.  Findings: Mild to moderate age appropriate cortical and deep atrophy.  Mild changes of small vessel disease white matter diffusely.   No mass lesion.  No midline shift.  No acute hemorrhage or hematoma.  No extra-axial fluid collections.  No evidence of acute infarction.  No skull fracture or other focal osseous abnormality involving the skull.  Visualized paranasal sinuses, bilateral mastoid air cells, and bilateral middle ear cavities well-aerated.  Mild bilateral carotid siphon atherosclerosis.  IMPRESSION:  1.  No acute intracranial abnormality. 2.  Mild to moderate age appropriate generalized atrophy and mild chronic microvascular ischemic changes of the white matter.   Original Report Authenticated By: Arnell Sieving, M.D.    Mri Brain Without Contrast  07/04/2012  *RADIOLOGY REPORT*  Clinical Data:  Headache.  Numbness around the mouth.  Vertigo. Rule out CVA.  MRI HEAD WITHOUT CONTRAST MRA HEAD WITHOUT CONTRAST  Technique:  Multiplanar, multiecho pulse sequences of the brain and surrounding structures were obtained without intravenous contrast. Angiographic images of the head were obtained using MRA technique without contrast.  Comparison:  CT head without contrast 07/04/2012.  MRI HEAD  Findings:  The diffusion weighted images demonstrate no evidence for acute or subacute infarction.  Mild generalized atrophy is likely within normal limits for age.  Scattered subcortical T2 and FLAIR hyperintensities are slightly greater than expected for age. No hemorrhage or mass lesion is present.  Flow is present in the major intracranial arteries.  The patient is status post bilateral lens extractions.  The paranasal sinuses and mastoid air cells are clear.  IMPRESSION:  1.  No acute intracranial abnormality. 2.  Atrophy is within normal limits for age. 3.  White matter disease is slightly greater than expected for age. This likely reflects the sequelae of chronic microvascular ischemia.  MRA HEAD  Findings: The internal carotid arteries demonstrate mild irregularity within the cavernous segments bilaterally.  There is no significant  stenosis.  There is focal signal loss in the distal left M1 segment, just proximal the bifurcation.  The A1 and M1 segments are otherwise within normal limits.  The anterior communicating artery is patent.  There is mild attenuation of distal MCA branch  vessels bilaterally, worse on the left.  There is significant signal loss in the right vertebral artery. This represents a high-grade stenosis or occlusion.  The left vertebral artery is patent.  The left PICA origin is visualized and within normal limits.  The basilar artery is normal.  The left posterior cerebral artery originates from the basilar tip.  The right posterior cerebral artery is of fetal type.  There is moderate signal loss within the PCA branch vessels bilaterally.  IMPRESSION:  1.  High-grade stenosis or occlusion of the right vertebral artery. Although no parenchymal findings were evident, this may account for the patient's symptoms of vertigo. 2.  Moderate stenosis of the distal left M1 segment. 3.  Moderate small vessel disease.  Critical Value/emergent results were called by telephone at the time of interpretation on 07/04/2012 at 11:25 p.m. to Peterson Lombard, R.N., Charge Nurse for the [facility] unit., who verbally acknowledged these results.   Original Report Authenticated By: Jamesetta Orleans. MATTERN, M.D.    Mr Maxine Glenn Head/brain Wo Cm  07/04/2012  *RADIOLOGY REPORT*  Clinical Data:  Headache.  Numbness around the mouth.  Vertigo. Rule out CVA.  MRI HEAD WITHOUT CONTRAST MRA HEAD WITHOUT CONTRAST  Technique:  Multiplanar, multiecho pulse sequences of the brain and surrounding structures were obtained without intravenous contrast. Angiographic images of the head were obtained using MRA technique without contrast.  Comparison:  CT head without contrast 07/04/2012.  MRI HEAD  Findings:  The diffusion weighted images demonstrate no evidence for acute or subacute infarction.  Mild generalized atrophy is likely within normal limits for age.  Scattered  subcortical T2 and FLAIR hyperintensities are slightly greater than expected for age. No hemorrhage or mass lesion is present.  Flow is present in the major intracranial arteries.  The patient is status post bilateral lens extractions.  The paranasal sinuses and mastoid air cells are clear.  IMPRESSION:  1.  No acute intracranial abnormality. 2.  Atrophy is within normal limits for age. 3.  White matter disease is slightly greater than expected for age. This likely reflects the sequelae of chronic microvascular ischemia.  MRA HEAD  Findings: The internal carotid arteries demonstrate mild irregularity within the cavernous segments bilaterally.  There is no significant stenosis.  There is focal signal loss in the distal left M1 segment, just proximal the bifurcation.  The A1 and M1 segments are otherwise within normal limits.  The anterior communicating artery is patent.  There is mild attenuation of distal MCA branch vessels bilaterally, worse on the left.  There is significant signal loss in the right vertebral artery. This represents a high-grade stenosis or occlusion.  The left vertebral artery is patent.  The left PICA origin is visualized and within normal limits.  The basilar artery is normal.  The left posterior cerebral artery originates from the basilar tip.  The right posterior cerebral artery is of fetal type.  There is moderate signal loss within the PCA branch vessels bilaterally.  IMPRESSION:  1.  High-grade stenosis or occlusion of the right vertebral artery. Although no parenchymal findings were evident, this may account for the patient's symptoms of vertigo. 2.  Moderate stenosis of the distal left M1 segment. 3.  Moderate small vessel disease.  Critical Value/emergent results were called by telephone at the time of interpretation on 07/04/2012 at 11:25 p.m. to Peterson Lombard, R.N., Charge Nurse for the [facility]., who verbally acknowledged these results.   Original Report Authenticated By: Jamesetta Orleans.  MATTERN, M.D.  Disposition and Follow-up: Discharge Orders    Future Appointments: Provider: Department: Dept Phone: Center:   12/08/2012 10:00 AM [facility] [place] [phone] [id number]   12/14/2012 3:30 PM Joaquim Nam, MD [facility] [phone] [id number]     Future Orders Please Complete By Expires   Diet Carb Modified      Increase activity slowly          DISPOSITION: Home DIET: Carb modified diet  ACTIVITY: As tolerated  TESTS THAT NEED FOLLOW-UP Hemoglobin A1c in 6-8 weeks, followup on BMET in a week  DISCHARGE FOLLOW-UP Follow-up Information    Follow up with Crawford Givens, MD. Schedule an appointment as soon as possible for a visit in 10 days. (for hospital follow-up)    Contact information:   [address] [place] [address] [phone]          Time spent on Discharge: 45 minutes  Signed:   RAI,RIPUDEEP M.D. [facility] 07/06/2012, 8:21 AM Pager: [phone]     RAI,RIPUDEEP M.D. [facility] 07/06/2012, 8:21 AM  Pager: [phone]

## 2012-07-07 ENCOUNTER — Telehealth: Payer: Self-pay | Admitting: Family Medicine

## 2012-07-07 NOTE — Telephone Encounter (Signed)
Spoke to patient and was advised that she was offered Home health when she left the hospital, but declined because she has someone taking care of her at home. Patient states that she got a call from someone at [facility] today advising her that they were scheduling her an appointment for diabetic teaching the first of October. Patient states that she does not know if she should wait that long to get a meter and start testing her blood sugar.  Patient states that she has a hospital follow-up scheduled with Dr. Para March next week.

## 2012-07-07 NOTE — Telephone Encounter (Signed)
Does she have home health?  If so, that would be reasonable.  If not, let me know.

## 2012-07-07 NOTE — Telephone Encounter (Signed)
Pt was in hospital at [facility] recently and was told she was going to have to check her blood sugar regularly and she is needing a nurse to come out and teach her how. She was wondering if home health could come out and do that ???

## 2012-07-08 NOTE — Telephone Encounter (Signed)
Patient notified as instructed by telephone. 

## 2012-07-08 NOTE — Telephone Encounter (Signed)
We'll discuss at the OV.  I wouldn't use a meter for now.  I would have her work on low carb diet in meantime.  Thanks.

## 2012-07-15 ENCOUNTER — Ambulatory Visit (INDEPENDENT_AMBULATORY_CARE_PROVIDER_SITE_OTHER): Payer: Medicare Other | Admitting: Family Medicine

## 2012-07-15 ENCOUNTER — Telehealth: Payer: Self-pay

## 2012-07-15 ENCOUNTER — Encounter: Payer: Self-pay | Admitting: Family Medicine

## 2012-07-15 VITALS — BP 120/78 | HR 88 | Temp 98.6°F | Ht 63.25 in | Wt 186.5 lb

## 2012-07-15 DIAGNOSIS — Z23 Encounter for immunization: Secondary | ICD-10-CM

## 2012-07-15 DIAGNOSIS — I10 Essential (primary) hypertension: Secondary | ICD-10-CM

## 2012-07-15 DIAGNOSIS — E119 Type 2 diabetes mellitus without complications: Secondary | ICD-10-CM

## 2012-07-15 NOTE — Progress Notes (Signed)
She had a return of vertigo and then had HA.  Was at home with family.  She felt changes on the R side of her face.  Was taken to [facility].  MRI done, no acute changes but high-grade stenosis or occlusion of the right vertebral artery noted (with no intervention planned).  Treated for possible TIA.  Hyperglycemia noted and pt started on glipizide.  Due for outpatient DM2 teaching.  We had been keeping her A1c monitored here in clinic and I didn't want to induce hypoglycemia prev.  She took sliding scale during the hospitalization.  She has had some likely hypoglycemia in the meantime since starting the glipizide as an outpatient.  She is working on low carb diet in meantime.    Her husband is still in SNF and this has been a strain on her. She thinks she'll get a meter at DM2 education.    She has no focal neuro sx now.  She is due for BMET f/u.  Her leg symptoms/pain are better with requip.  She's sleeping better.  She has not dizziness.    Meds, vitals, and allergies reviewed.   ROS: See HPI.  Otherwise, noncontributory.  GEN: nad, alert and oriented HEENT: mucous membranes moist NECK: supple w/o LA CV: rrr. PULM: ctab, no inc wob ABD: soft, +bs EXT: no edema SKIN: no acute rash

## 2012-07-15 NOTE — Telephone Encounter (Signed)
Yes, please continue and add to med list.  Thanks.

## 2012-07-15 NOTE — Patient Instructions (Addendum)
If the diabetes class is not going to get you a meter, then fill the rx for the meter and get the strips to match.   Go to the lab on the way out.  We'll contact you with your lab report. Stop the glipizide for now.  When you get the meter and have gone to the class, let me know about your sugar readings.   Stay off the potatoes and bread as we discussed.   Let's plan on getting together in another 3 months.   If you have any other BP meds at home, let me know.

## 2012-07-15 NOTE — Telephone Encounter (Signed)
Pt seen today and was to call back re: pt is taking Trimt-HCTZ 37.5-25 mg one tab po daily. Pt states has been taking for a long time. Does Dr Para March want pt to continue to take med?Please advise.

## 2012-07-15 NOTE — Assessment & Plan Note (Signed)
Stop glipizide as she likely had hypoglycemia after leaving the hospital.  rx given for meter.  She'll get on if she won't be provided with one at DM2 education.  rx written for strips and lancets.  Continue DM2 diet and she'll notify me with numbers after her DM2 teaching.  She agrees with plan.  Continue other meds in meantime and check BMET due to mild inc in Cr during hospitalization.  Okay for outpatient f/u.  Goal of secondary prevention of TIA w/o induction of hypoglycemia, hypotension.  She agrees. >25 min spent with face to face with patient, >50% counseling and/or coordinating care.

## 2012-07-16 ENCOUNTER — Encounter: Payer: Self-pay | Admitting: Family Medicine

## 2012-07-16 LAB — BASIC METABOLIC PANEL
CO2: 30 mEq/L (ref 19–32)
Calcium: 9.8 mg/dL (ref 8.4–10.5)
Glucose, Bld: 100 mg/dL — ABNORMAL HIGH (ref 70–99)
Sodium: 137 mEq/L (ref 135–145)

## 2012-07-16 NOTE — Telephone Encounter (Signed)
Added to meds list.  Pt advised.

## 2012-07-17 ENCOUNTER — Encounter: Payer: Self-pay | Admitting: *Deleted

## 2012-08-04 ENCOUNTER — Encounter: Payer: Medicare Other | Attending: Internal Medicine | Admitting: *Deleted

## 2012-08-04 ENCOUNTER — Encounter: Payer: Self-pay | Admitting: *Deleted

## 2012-08-04 DIAGNOSIS — Z713 Dietary counseling and surveillance: Secondary | ICD-10-CM | POA: Insufficient documentation

## 2012-08-04 DIAGNOSIS — E119 Type 2 diabetes mellitus without complications: Secondary | ICD-10-CM | POA: Insufficient documentation

## 2012-08-04 NOTE — Progress Notes (Signed)
  Medical Nutrition Therapy:  Appt start time: 1645 end time:  1745.  Assessment:  Primary concerns today: patient here for diabetes education. She is here with her grandson who lives with her. She is newly diagnosed with diabetes in he last 6 months. She does not take any diabetes medications yet. Her current A1c is 7.7%  MEDICATIONS: see list   DIETARY INTAKE:  Usual eating pattern includes 3 meals and 3 snacks per day.  Everyday foods include good variety of all food groups, chocolate milk.  Avoided foods include sweets, pasta.    24-hr recall:  B ( AM): 2 eggs, water OR bologna sandwich and coffee  Snk ( AM): fresh fruit OR 6 pack of crackers OR almonds or peanuts L ( PM): tomato sandwich while visiting husband  Snk ( PM): same as AM with Gator-aid or Tomato juice D ( PM): small portion of meat, starch, vegetable or salad, Ranch dressing,  Snk ( PM): popcorn OR 2-3 crackers  Beverages: coffee, water, sweet tea occasionally  Usual physical activity: shops with her sister so walks in the stores, walks outside in garden  Estimated energy needs: 1400 calories 158 g carbohydrates 105 g protein 39 g fat  Progress Towards Goal(s):  In progress.   Nutritional Diagnosis:  NB-1.1 Food and nutrition-related knowledge deficit As related to new diagnosis of diabetes.  As evidenced by A1c of 7.7% .    Intervention:  Nutrition counseling and diabetes education initiated. Discussed basic physiology of diabetes, SMBG and rationale of checking BG at alternate times of day when Rx'd by MD, A1c, Carb Counting and reading food labels, and benefits of daily activity.  Handouts given during visit include: Living Well with Diabetes Carb Counting and Food Label handouts Meal Plan Card  Monitoring/Evaluation:  Dietary intake, exercise, reading food labels, and body weight prn.

## 2012-08-05 ENCOUNTER — Telehealth: Payer: Self-pay | Admitting: *Deleted

## 2012-08-05 NOTE — Telephone Encounter (Signed)
Patient notified as instructed by telephone. 

## 2012-08-05 NOTE — Telephone Encounter (Signed)
That's fine.  Stay off the medicine for now.  Have her check her AM sugar (before eating) for a few mornings.  Let me know about the numbers (ie next week).  I can help her with dosing the medicine then (if needed).  Don't start back on the glipizide for now.  Thanks.

## 2012-08-05 NOTE — Telephone Encounter (Signed)
Left message on answering machine to call back.

## 2012-08-05 NOTE — Telephone Encounter (Signed)
Patient called stating that she was asked to call you after going to the diabetes class in [place]. . Patient called to let you know that her BS at 5:15 yesterday  was 130. which they said that it was good. Patient stated that she had lunch and a snack mid-afternoon before the BS reading. Patient stated that she has not taken any of the diabetic medication?

## 2012-08-11 NOTE — Patient Instructions (Signed)
Goals:  Follow Diabetes Meal Plan as instructed  Eat 3 meals and 2 snacks, every 3-5 hrs  Limit carbohydrate intake to 30-45 grams carbohydrate/meal  Limit carbohydrate intake to 0-15 grams carbohydrate/snack  Add lean protein foods to meals/snacks  Monitor glucose levels as instructed by your doctor  Aim for 15-30 mins of physical activity daily  Bring food record and glucose log to your next nutrition visit   

## 2012-08-31 ENCOUNTER — Other Ambulatory Visit: Payer: Self-pay | Admitting: *Deleted

## 2012-08-31 MED ORDER — OMEPRAZOLE 20 MG PO CPDR: 20.0000 mg | DELAYED_RELEASE_CAPSULE | Freq: Every day | ORAL | Status: DC | PRN

## 2012-08-31 MED ORDER — QUINAPRIL HCL 40 MG PO TABS: 40.0000 mg | ORAL_TABLET | Freq: Every day | ORAL | Status: DC

## 2012-08-31 NOTE — Telephone Encounter (Signed)
She was prev on glipizide 5mg  a day.  See if she still has some- she should cut it in half and take 2.5mg  a day. If she doesn't have it, then please call in 90 day supply with [facility].  Please add to med list.  Have her check AM sugars after she has been on the medicine for about 1 week.  If any trouble with low sugars, then stop the medicine and notify clinic.  Thanks.

## 2012-08-31 NOTE — Telephone Encounter (Signed)
Pt called FBS 08/23/12 FBS 162; 08/25/12 FBS 155; 08/26/12 FBS 153;  08/27/12 FBS 159;  08/28/12 FBS 157 AND  08/29/12 FBS 150.Please advise.

## 2012-09-01 ENCOUNTER — Encounter: Payer: Self-pay | Admitting: Family Medicine

## 2012-09-01 NOTE — Telephone Encounter (Signed)
Patient advised of instructions.  She has a 1 month supply of medication on hand once she cuts them in half.  We will await her FBS readings before additional med is phoned in.

## 2012-09-04 ENCOUNTER — Other Ambulatory Visit: Payer: Self-pay | Admitting: *Deleted

## 2012-10-14 ENCOUNTER — Ambulatory Visit (INDEPENDENT_AMBULATORY_CARE_PROVIDER_SITE_OTHER): Payer: Medicare Other | Admitting: Family Medicine

## 2012-10-14 ENCOUNTER — Encounter: Payer: Self-pay | Admitting: Family Medicine

## 2012-10-14 VITALS — BP 124/62 | HR 88 | Temp 97.5°F | Wt 185.0 lb

## 2012-10-14 DIAGNOSIS — E119 Type 2 diabetes mellitus without complications: Secondary | ICD-10-CM

## 2012-10-14 MED ORDER — GLIPIZIDE 5 MG PO TABS: 2.5000 mg | ORAL_TABLET | Freq: Every day | ORAL | Status: DC

## 2012-10-14 NOTE — Assessment & Plan Note (Signed)
Improved, continue as is.  Recheck in about 3 months, sooner prn.   Support offered [id number] illness.  She appears to be grieving the anticipated loss in a reasonable way.

## 2012-10-14 NOTE — Patient Instructions (Addendum)
Don't change your meds for now.  Go to the lab on the way out.  We'll contact you with your lab report. Take care.  I'll be thinking about you.  If I can do anything, let me know.   I would like to see you back in about 3 months.

## 2012-10-14 NOTE — Progress Notes (Signed)
Husband is on hospice and this has been a huge strain for the patient.  She has done everything within her power to care for him.  They have been married for 67 years.  She still has the 120+ letters he wrote her from [place] in WW2.  He is likely to die in the near future.    Diabetes:  Using medications without difficulties: yes.  She feels better with 1/2 tab of 5mg  glipizide.   Hypoglycemic episodes:no Hyperglycemic episodes:no Feet problems:no Blood Sugars averaging: 130-150s in AM usually Due for A1c.    Meds, vitals, and allergies reviewed.   ROS: See HPI.  Otherwise negative.    GEN: nad, alert and oriented, tearful but regains composure.  HEENT: mucous membranes moist NECK: supple w/o LA CV: rrr. PULM: ctab, no inc wob EXT: no edema SKIN: no acute rash

## 2012-10-15 ENCOUNTER — Encounter: Payer: Self-pay | Admitting: *Deleted

## 2012-10-22 ENCOUNTER — Telehealth: Payer: Self-pay | Admitting: Family Medicine

## 2012-10-22 NOTE — Telephone Encounter (Signed)
I called and LMOVM for patient.  I wanted to let her know I was thinking about her, esp since she is caring for her husband.

## 2012-11-05 ENCOUNTER — Encounter: Payer: Self-pay | Admitting: Family Medicine

## 2012-11-09 ENCOUNTER — Other Ambulatory Visit: Payer: Self-pay | Admitting: Family Medicine

## 2012-11-09 NOTE — Telephone Encounter (Signed)
Electronic refill request.  Please advise. 

## 2012-11-10 NOTE — Telephone Encounter (Signed)
Sent!

## 2012-12-08 ENCOUNTER — Other Ambulatory Visit: Payer: Medicare Other

## 2012-12-14 ENCOUNTER — Ambulatory Visit: Payer: Medicare Other | Admitting: Family Medicine

## 2013-01-04 ENCOUNTER — Other Ambulatory Visit (INDEPENDENT_AMBULATORY_CARE_PROVIDER_SITE_OTHER): Payer: Medicare Other

## 2013-01-04 DIAGNOSIS — E119 Type 2 diabetes mellitus without complications: Secondary | ICD-10-CM

## 2013-01-04 LAB — COMPREHENSIVE METABOLIC PANEL
Albumin: 3.8 g/dL (ref 3.5–5.2)
Alkaline Phosphatase: 51 U/L (ref 39–117)
BUN: 24 mg/dL — ABNORMAL HIGH (ref 6–23)
Calcium: 9.4 mg/dL (ref 8.4–10.5)
GFR: 38.03 mL/min — ABNORMAL LOW (ref >60.00)
Glucose, Bld: 127 mg/dL — ABNORMAL HIGH (ref 70–99)
Potassium: 4.3 mEq/L (ref 3.5–5.1)

## 2013-01-04 LAB — LIPID PANEL
Cholesterol: 208 mg/dL — ABNORMAL HIGH (ref 0–200)
Total CHOL/HDL Ratio: 7
Triglycerides: 175 mg/dL — ABNORMAL HIGH (ref 0.0–149.0)

## 2013-01-11 ENCOUNTER — Ambulatory Visit (INDEPENDENT_AMBULATORY_CARE_PROVIDER_SITE_OTHER): Payer: Medicare Other | Admitting: Family Medicine

## 2013-01-11 ENCOUNTER — Encounter: Payer: Self-pay | Admitting: Family Medicine

## 2013-01-11 VITALS — BP 124/64 | HR 88 | Temp 98.0°F | Wt 187.0 lb

## 2013-01-11 DIAGNOSIS — Z609 Problem related to social environment, unspecified: Secondary | ICD-10-CM

## 2013-01-11 DIAGNOSIS — G2581 Restless legs syndrome: Secondary | ICD-10-CM

## 2013-01-11 DIAGNOSIS — E119 Type 2 diabetes mellitus without complications: Secondary | ICD-10-CM

## 2013-01-11 DIAGNOSIS — Z7289 Other problems related to lifestyle: Secondary | ICD-10-CM

## 2013-01-11 DIAGNOSIS — R439 Unspecified disturbances of smell and taste: Secondary | ICD-10-CM

## 2013-01-11 MED ORDER — ROPINIROLE HCL 1 MG PO TABS: 1.0000 mg | ORAL_TABLET | Freq: Every day | ORAL | Status: DC

## 2013-01-11 MED ORDER — GLIPIZIDE 5 MG PO TABS: 2.5000 mg | ORAL_TABLET | Freq: Every day | ORAL | Status: DC

## 2013-01-11 MED ORDER — GLUCOSE BLOOD VI STRP: ORAL_STRIP | Status: DC

## 2013-01-11 NOTE — Patient Instructions (Addendum)
If you have more troubles with low sugars, then stop the glipizide and let me know.   Recheck labs in 9/14 and then come see me.   If the roof of your mouth continues to be sensitive, then notify the dentist.   Take care.

## 2013-01-11 NOTE — Progress Notes (Signed)
Her husband died in 12/13 and her son has been in the hospital recently.  She is doing well overall, especially considering the recent upheaval.  She is thinking about going to walk at the mall 3 times a week.  She has support from church, friends and family.   She has noted the roof of her mouth is more sensitive to spicy foods.    Her RLS sx returned after she had a trial off requip.  Improved again when restarting meds.  No ADE.   Diabetes:  Using medications without difficulties:yes Hypoglycemic episodes:no Hyperglycemic episodes: very rare Feet problems:no Blood Sugars averaging: ~140 in AM  PMH and SH reviewed  Meds, vitals, and allergies reviewed.   ROS: See HPI.  Otherwise negative.    GEN: nad, alert and oriented HEENT: mucous membranes moist, OP with normal inspection, esp the roof of the mouth NECK: supple w/o LA CV: rrr. PULM: ctab, no inc wob ABD: soft, +bs EXT: no edema SKIN: no acute rash

## 2013-01-12 DIAGNOSIS — R439 Unspecified disturbances of smell and taste: Secondary | ICD-10-CM | POA: Insufficient documentation

## 2013-01-12 DIAGNOSIS — G2581 Restless legs syndrome: Secondary | ICD-10-CM | POA: Insufficient documentation

## 2013-01-12 NOTE — Assessment & Plan Note (Signed)
Her RLS sx returned after she had a trial off requip.  Improved again when restarting meds.  No ADE.

## 2013-01-12 NOTE — Assessment & Plan Note (Signed)
Controlled A1c, she'll walk more and will notify me if sig low.  She agrees.  Continue as is for now.  Labs d/w pt.  Statin intolerant.

## 2013-01-12 NOTE — Assessment & Plan Note (Signed)
With sensitive roof of mouth with spicy foods.  Would avoid triggering foods for now and then f/u with dental clinic if it continues or worsens.  OP wnl on exam today.

## 2013-03-15 ENCOUNTER — Other Ambulatory Visit: Payer: Self-pay | Admitting: Family Medicine

## 2013-04-07 ENCOUNTER — Telehealth: Payer: Self-pay | Admitting: Family Medicine

## 2013-04-07 NOTE — Telephone Encounter (Signed)
Patient Information:  Caller Name: Shyah  Phone: [phone]  Patient: Cathy Larsen, Cathy Larsen  Gender: Female  DOB: 1926-05-02  Age: 79 Years  PCP: Crawford Givens Clelia Croft) [facility])  Office Follow Up:  Does the office need to follow up with this patient?: Yes  Instructions For The Office: Please call ASAP re: work- in apptointment within next 4 hrs.  RN Note:  Numbness and tingling in left fingers for past 4-5 weeks. New onset of  Unsteadiness, lightheaded when changes position.  Stopped BP medication and symptoms improved. No dizziness so far today. No appointments remain in next 4 hours; information forwarded to office staff for work-in appointment.   Symptoms  Reason For Call & Symptoms: Numbness and tingling in fingers of left hand for past 4-5 weeks with  new onset of unsteadiness when walks and lightheaded when changes position.  Stopped taking BP meds 04/05/13.  BP 110/51 right arm 04/06/13 at 1700.  FBS 143.  Reviewed Health History In EMR: Yes  Reviewed Medications In EMR: Yes  Reviewed Allergies In EMR: Yes  Reviewed Surgeries / Procedures: Yes  Date of Onset of Symptoms: 04/03/2013  Treatments Tried: Stopped BP meds  Treatments Tried Worked: Yes  Guideline(s) Used:  Neurologic Deficit  Disposition Per Guideline:   Go to Office Now  Reason For Disposition Reached:   Neurologic deficit of gradual onset, ANY of the following:   Weakness of the face, arm, or leg on one side of the body  Numbness of the face, arm, or leg on one side of the body  Loss of speech or garbled speech  Advice Given:  Call Back If:  You become worse.  Patient Will Follow Care Advice:  YES

## 2013-04-07 NOTE — Telephone Encounter (Signed)
Tomorrow is reasonable.  Thanks.

## 2013-04-07 NOTE — Telephone Encounter (Signed)
Pt is calling back because she has not heard back from the office about a possible work-in today. RN checked EPIC and saw documentation at 08:51 this am to CAN and resulting answer from Dr. Para March. Per his recommendation RN clarified with pt that yes she has improved off the BP med: in other words ; no dizziness/unsteadiness or lightheadedness. However, the numbness which is to her hand only and involves several fingers , which has been the on-going sx for the past 4-5 weeks is present. RN scheduled next available appt tomorrow at 12:15 with Dr. Para March. If the office wants her to go to ED instead/please call pt back.

## 2013-04-07 NOTE — Telephone Encounter (Signed)
Call pt and clarify this.  If she is improved off the BP med, has no sx currently, and the prev sx were going on for the last 4-5 weeks, then it would be okay to get her in today or tomorrow.  If she has emergent sx, then she needs to be at ER.  Thanks.

## 2013-04-08 ENCOUNTER — Ambulatory Visit (INDEPENDENT_AMBULATORY_CARE_PROVIDER_SITE_OTHER): Payer: Medicare Other | Admitting: Family Medicine

## 2013-04-08 ENCOUNTER — Encounter: Payer: Self-pay | Admitting: Family Medicine

## 2013-04-08 VITALS — BP 156/68 | HR 88 | Temp 98.5°F | Wt 189.8 lb

## 2013-04-08 DIAGNOSIS — I10 Essential (primary) hypertension: Secondary | ICD-10-CM

## 2013-04-08 MED ORDER — TRIAMTERENE-HCTZ 37.5-25 MG PO CAPS: 1.0000 | ORAL_CAPSULE | Freq: Every day | ORAL | Status: DC

## 2013-04-08 MED ORDER — QUINAPRIL HCL 40 MG PO TABS: 20.0000 mg | ORAL_TABLET | Freq: Every day | ORAL | Status: DC

## 2013-04-08 NOTE — Progress Notes (Signed)
She started having sx since the last OV.  She had some numbness in the L hand (palm side of the 2nd- 5th fingers). She had been pulling a lot of weeds and working in the yard before this started.  That is better but not fully resolved. She had no weakness, but "it was like it was asleep."    About that time, she had some trouble with "dizzy" sensations. It was noted after getting out of a chair. She was lightheaded, not vertiginous.  When she leans over and then stands up, she'll get symptoms. Her stress level is some better now (her husband had died last year).  He BP was 110/49 at home.  She stopped her BP meds, except for a triamterene/HCTZ yesterday.  She is improved after cutting back her BP meds.   Meds, vitals, and allergies reviewed.   ROS: See HPI.  Otherwise, noncontributory.  nad ncat Mmm rrr ctab Abd soft Ext w/o edema She has no weakness in the hands or BLEs

## 2013-04-08 NOTE — Patient Instructions (Addendum)
Stop the amlodipine.  Hold off on taking the triamterene/HCTZ for now (orange cap). Take 20mg  (1/2 a pill) of quinapril.  Let me know about your BP and how you are feeling next week. If you have more swelling, we may get you back on the triamterene/HCTZ.  Take care.

## 2013-04-09 NOTE — Assessment & Plan Note (Signed)
Likely over treated.  Would start back on 1/2 dose of ACE, then consider full dose vs addition of HCTZ combo depending on her BP and amount of edema.  She agrees.  The hand sx are likely totally unrelated and resolving. Would follow clinically.

## 2013-04-20 ENCOUNTER — Telehealth: Payer: Self-pay

## 2013-04-20 NOTE — Telephone Encounter (Signed)
Pt left v/m; pt seen 04/08/13; ankles and feet are swelling and pt thinks needs to be back on triamterene-HCTZ.  BP on 04/18/13 was 147/71, 04/19/13 BP 145/63 and today BP 141/67; pulse rate averages between 80-90. Pt is presently taking Quinapril 40 mg 1/2 tab daily. If DrDuncan wants pt to continue Quinapril will need refill ( pt has 1 week of med left).Please advise.

## 2013-04-21 MED ORDER — QUINAPRIL HCL 20 MG PO TABS: 20.0000 mg | ORAL_TABLET | Freq: Every day | ORAL | Status: DC

## 2013-04-21 MED ORDER — TRIAMTERENE-HCTZ 37.5-25 MG PO CAPS: 1.0000 | ORAL_CAPSULE | Freq: Every day | ORAL | Status: DC

## 2013-04-21 NOTE — Telephone Encounter (Signed)
Would continue the lower dose of quinapril- new rx sent for 20mg  so she won't have to split the pills.  Would start back on triamterene-HCTZ. Have her update [place] if the edema doesn't improve or if she is getting lightheaded after the new meds are added back on. Thanks.

## 2013-04-22 ENCOUNTER — Other Ambulatory Visit: Payer: Self-pay | Admitting: Family Medicine

## 2013-04-22 NOTE — Telephone Encounter (Signed)
Pt called back re; swelling in feet and SOB when walking any distance.  Systolic BP is averaging 145. Pt request cb [phone]

## 2013-04-22 NOTE — Telephone Encounter (Signed)
Patient advised.

## 2013-05-13 ENCOUNTER — Other Ambulatory Visit: Payer: Self-pay

## 2013-05-17 ENCOUNTER — Telehealth: Payer: Self-pay

## 2013-05-17 NOTE — Telephone Encounter (Signed)
Pt left v/m; last week pt felt really tired and BP was low, swelling was not bad;pt  stopped taking all BP meds; on 05/15/13 pt developed film over eyes which is still there. Pt has been taking fish oil. On 05/15/13 BP was 138/70 and Today BP is 123/58. Pt said she feels fine. Wants to know if she should continue with no BP meds.Please advise.

## 2013-05-17 NOTE — Telephone Encounter (Signed)
Continue off the BP meds for now.  She should continue to check BP this week and next, restart meds if BP is >140/>90.  It may take a few days to get all the way out of her system, and her BP may not increase back for a few days.  Thanks.  It would be nice to see her later in the week, if at all possible.  Thanks.

## 2013-05-18 NOTE — Telephone Encounter (Signed)
Left message on patient's voicemail to return call

## 2013-05-20 NOTE — Telephone Encounter (Signed)
Left message on patient's voicemail to return call

## 2013-05-20 NOTE — Telephone Encounter (Signed)
Patient advised.

## 2013-05-27 ENCOUNTER — Encounter: Payer: Self-pay | Admitting: Family Medicine

## 2013-05-27 ENCOUNTER — Ambulatory Visit (INDEPENDENT_AMBULATORY_CARE_PROVIDER_SITE_OTHER): Payer: Medicare Other | Admitting: Family Medicine

## 2013-05-27 VITALS — BP 136/90 | HR 81 | Temp 98.0°F | Wt 190.5 lb

## 2013-05-27 DIAGNOSIS — I10 Essential (primary) hypertension: Secondary | ICD-10-CM

## 2013-05-27 DIAGNOSIS — E119 Type 2 diabetes mellitus without complications: Secondary | ICD-10-CM

## 2013-05-27 MED ORDER — GLIPIZIDE 5 MG PO TABS: 5.0000 mg | ORAL_TABLET | Freq: Every day | ORAL | Status: DC

## 2013-05-27 NOTE — Progress Notes (Signed)
Prev notes reviewed.  Med changes noted.   Off meds.  BP has been controlled.    She feels well now.  Chest pain with exertion:no Edema:no Short of breath:no Average home BPs:130s/70s.  She isn't lightheaded now off the BP meds.   DM2.  Not due for A1c yet.  Her sugar has been ~150 at home recently.  She is on a low carb diet.   Allergy and ear sx are better after getting mold resolved in bedroom.  She feels well.  No rhinorrhea and not stuffy.    Her vision (film over her eyes) is better after stopping her eye drops.    Meds, vitals, and allergies reviewed.   PMH and SH reviewed  ROS: See HPI.  Otherwise negative.    GEN: nad, alert and oriented HEENT: mucous membranes moist NECK: supple w/o LA CV: rrr. PULM: ctab, no inc wob ABD: soft, +bs EXT: no edema SKIN: no acute rash  Diabetic foot exam: Normal inspection No skin breakdown No calluses  Normal DP pulses Normal sensation to light touch and monofilament Nails normal

## 2013-05-27 NOTE — Patient Instructions (Addendum)
Try taking a full dose of the glipizide a day.  If you feel bad because your sugar is low, then cut back to a 1/2 tab.  Keep the September appointment.  Take care.  Glad to see you.

## 2013-05-28 ENCOUNTER — Encounter: Payer: Self-pay | Admitting: Family Medicine

## 2013-05-28 NOTE — Assessment & Plan Note (Signed)
Inc glipizide for now, recheck later in 2014.  She agrees, cut back to 1/2 dose if low sugars.

## 2013-05-28 NOTE — Assessment & Plan Note (Signed)
Controlled, continue off meds for now.  She agrees.  Feels better.

## 2013-07-12 ENCOUNTER — Other Ambulatory Visit (INDEPENDENT_AMBULATORY_CARE_PROVIDER_SITE_OTHER): Payer: Medicare Other

## 2013-07-12 DIAGNOSIS — E119 Type 2 diabetes mellitus without complications: Secondary | ICD-10-CM

## 2013-07-12 LAB — HEMOGLOBIN A1C: Hgb A1c MFr Bld: 7 % — ABNORMAL HIGH (ref 4.6–6.5)

## 2013-07-19 ENCOUNTER — Ambulatory Visit (INDEPENDENT_AMBULATORY_CARE_PROVIDER_SITE_OTHER): Payer: Medicare Other | Admitting: Family Medicine

## 2013-07-19 ENCOUNTER — Encounter: Payer: Self-pay | Admitting: Family Medicine

## 2013-07-19 VITALS — BP 142/84 | HR 87 | Temp 98.1°F | Wt 186.8 lb

## 2013-07-19 DIAGNOSIS — E119 Type 2 diabetes mellitus without complications: Secondary | ICD-10-CM

## 2013-07-19 NOTE — Patient Instructions (Addendum)
I would get a flu shot each fall.    Take care. Recheck in about 6 months at a physical.  Glad to see you.

## 2013-07-19 NOTE — Progress Notes (Signed)
She had a HA on the requip.  The HA got better.  She still moves her legs some and this is more tolerable then the headache.  She increased the ginger and had less cramps.   Diabetes:  Using medications without difficulties:yes Hypoglycemic episodes:no Hyperglycemic episodes:no Feet problems:no Blood Sugars averaging: 130-140 eye exam within last year: yes She cut the glipizide back and felt better.   She is busy and her spirits are good.  She had been traveling with her church.  This has helped her.   She'll get a flu shot at the pharmacy.    PMH and SH reviewed  Meds, vitals, and allergies reviewed.   ROS: See HPI.  Otherwise negative.    GEN: nad, alert and oriented HEENT: mucous membranes moist NECK: supple w/o LA CV: rrr. PULM: ctab, no inc wob ABD: soft, +bs EXT: no edema SKIN: no acute rash  Diabetic foot exam: Normal inspection No skin breakdown No calluses  Normal DP pulses Normal sensation to light touch and monofilament Nails normal

## 2013-07-19 NOTE — Assessment & Plan Note (Signed)
Controlled, continue current dose of meds. A1c d/w pt.  She agrees.  She'll get a flu shot at the pharmacy.  F/u in about 6 months.  Doing well.  Diet discussed.

## 2013-08-05 ENCOUNTER — Encounter: Payer: Self-pay | Admitting: Family Medicine

## 2013-08-05 ENCOUNTER — Ambulatory Visit (INDEPENDENT_AMBULATORY_CARE_PROVIDER_SITE_OTHER): Payer: Medicare Other | Admitting: Family Medicine

## 2013-08-05 VITALS — BP 160/90 | HR 76 | Temp 98.8°F | Wt 184.5 lb

## 2013-08-05 DIAGNOSIS — IMO0002 Reserved for concepts with insufficient information to code with codable children: Secondary | ICD-10-CM | POA: Insufficient documentation

## 2013-08-05 DIAGNOSIS — M19049 Primary osteoarthritis, unspecified hand: Secondary | ICD-10-CM

## 2013-08-05 DIAGNOSIS — L988 Other specified disorders of the skin and subcutaneous tissue: Secondary | ICD-10-CM

## 2013-08-05 MED ORDER — CEPHALEXIN 500 MG PO CAPS: 500.0000 mg | ORAL_CAPSULE | Freq: Three times a day (TID) | ORAL | Status: DC

## 2013-08-05 NOTE — Progress Notes (Signed)
She had been looking after her sister- her sister broke her neck and is in a collar but isn't paralyzed.  This likely has something to do with her elevated BP.   BP had been ~150/70s on home checks.  She isn't on BP meds.  Recheck 160/90.   R 2nd finger with swollen and red lesion on the dorsum of the finger.  No trauma. No fevers.  She has had dec in ROM in the fingers for a long period of time.  She has some baseline numbness in the R fingers that had the most OA changes.   Meds, vitals, and allergies reviewed.   ROS: See HPI.  Otherwise, noncontributory.  nad ncat rrr ctab R hand with chronic IP changes and now with red cystic lesion on the dorsum of the middle phalanx.  No purulent discharge.  No palmar erythema.

## 2013-08-05 NOTE — Patient Instructions (Signed)
Cathy Larsen will call about your referral. Start the antibiotics today.  Call me if you aren't improving.

## 2013-08-05 NOTE — Assessment & Plan Note (Signed)
Irritated vs infected, would start keflex for now and have her fu with the hand clinic.  No spreading erythema.  If worsening, she'll notify us in the meantime.  If she improves in the interval, I still want hand input re: IP OA changes.  She agrees.  Okay for outpatient f/u.    Also, condolences offered [id number].  She'll monitor BP at home.

## 2013-09-02 ENCOUNTER — Encounter: Payer: Self-pay | Admitting: Family Medicine

## 2013-11-11 ENCOUNTER — Other Ambulatory Visit: Payer: Self-pay | Admitting: Family Medicine

## 2014-01-10 ENCOUNTER — Other Ambulatory Visit: Payer: Self-pay | Admitting: Family Medicine

## 2014-01-10 DIAGNOSIS — E119 Type 2 diabetes mellitus without complications: Secondary | ICD-10-CM

## 2014-01-11 ENCOUNTER — Other Ambulatory Visit (INDEPENDENT_AMBULATORY_CARE_PROVIDER_SITE_OTHER): Payer: Medicare Other

## 2014-01-11 DIAGNOSIS — E119 Type 2 diabetes mellitus without complications: Secondary | ICD-10-CM

## 2014-01-11 LAB — LIPID PANEL
CHOLESTEROL: 205 mg/dL — AB (ref 0–200)
HDL: 31.4 mg/dL — ABNORMAL LOW (ref >39.00)
LDL Cholesterol: 138 mg/dL — ABNORMAL HIGH (ref 0–99)
Total CHOL/HDL Ratio: 7
Triglycerides: 179 mg/dL — ABNORMAL HIGH (ref 0.0–149.0)
VLDL: 35.8 mg/dL (ref 0.0–40.0)

## 2014-01-11 LAB — COMPREHENSIVE METABOLIC PANEL
ALT: 14 U/L (ref 0–35)
AST: 17 U/L (ref 0–37)
Albumin: 3.9 g/dL (ref 3.5–5.2)
Alkaline Phosphatase: 63 U/L (ref 39–117)
BILIRUBIN TOTAL: 0.5 mg/dL (ref 0.3–1.2)
BUN: 18 mg/dL (ref 6–23)
CO2: 30 meq/L (ref 19–32)
CREATININE: 1.2 mg/dL (ref 0.4–1.2)
Calcium: 9.2 mg/dL (ref 8.4–10.5)
Chloride: 103 mEq/L (ref 96–112)
GFR: 45.33 mL/min — AB (ref >60.00)
GLUCOSE: 143 mg/dL — AB (ref 70–99)
Potassium: 4.2 mEq/L (ref 3.5–5.1)
SODIUM: 141 meq/L (ref 135–145)
TOTAL PROTEIN: 6.8 g/dL (ref 6.0–8.3)

## 2014-01-11 LAB — HEMOGLOBIN A1C: Hgb A1c MFr Bld: 6.9 % — ABNORMAL HIGH (ref 4.6–6.5)

## 2014-01-14 ENCOUNTER — Telehealth: Payer: Self-pay | Admitting: *Deleted

## 2014-01-14 ENCOUNTER — Emergency Department (HOSPITAL_COMMUNITY)
Admission: EM | Admit: 2014-01-14 | Discharge: 2014-01-14 | Disposition: A | Discharge status: Home / Self Care | Payer: Medicare Other | Attending: Emergency Medicine | Admitting: Emergency Medicine

## 2014-01-14 ENCOUNTER — Encounter (HOSPITAL_COMMUNITY): Payer: Self-pay | Admitting: Emergency Medicine

## 2014-01-14 ENCOUNTER — Emergency Department (HOSPITAL_COMMUNITY): Payer: Medicare Other

## 2014-01-14 DIAGNOSIS — I10 Essential (primary) hypertension: Secondary | ICD-10-CM | POA: Insufficient documentation

## 2014-01-14 DIAGNOSIS — Z7982 Long term (current) use of aspirin: Secondary | ICD-10-CM | POA: Insufficient documentation

## 2014-01-14 DIAGNOSIS — Z79899 Other long term (current) drug therapy: Secondary | ICD-10-CM | POA: Insufficient documentation

## 2014-01-14 DIAGNOSIS — Z792 Long term (current) use of antibiotics: Secondary | ICD-10-CM | POA: Insufficient documentation

## 2014-01-14 DIAGNOSIS — Z862 Personal history of diseases of the blood and blood-forming organs and certain disorders involving the immune mechanism: Secondary | ICD-10-CM | POA: Insufficient documentation

## 2014-01-14 DIAGNOSIS — I2510 Atherosclerotic heart disease of native coronary artery without angina pectoris: Secondary | ICD-10-CM | POA: Insufficient documentation

## 2014-01-14 DIAGNOSIS — Z8639 Personal history of other endocrine, nutritional and metabolic disease: Secondary | ICD-10-CM | POA: Insufficient documentation

## 2014-01-14 DIAGNOSIS — R202 Paresthesia of skin: Secondary | ICD-10-CM

## 2014-01-14 DIAGNOSIS — R209 Unspecified disturbances of skin sensation: Secondary | ICD-10-CM | POA: Insufficient documentation

## 2014-01-14 DIAGNOSIS — E119 Type 2 diabetes mellitus without complications: Secondary | ICD-10-CM | POA: Insufficient documentation

## 2014-01-14 LAB — CBC
HCT: 43.9 % (ref 36.0–46.0)
Hemoglobin: 14.9 g/dL (ref 12.0–15.0)
MCH: 29.5 pg (ref 26.0–34.0)
MCHC: 33.9 g/dL (ref 30.0–36.0)
MCV: 86.9 fL (ref 78.0–100.0)
Platelets: 298 10*3/uL (ref 150–400)
RBC: 5.05 MIL/uL (ref 3.87–5.11)
RDW: 13.5 % (ref 11.5–15.5)
WBC: 8.7 10*3/uL (ref 4.0–10.5)

## 2014-01-14 LAB — DIFFERENTIAL
BASOS ABS: 0 10*3/uL (ref 0.0–0.1)
Basophils Relative: 0 % (ref 0–1)
Eosinophils Absolute: 0.2 10*3/uL (ref 0.0–0.7)
Eosinophils Relative: 3 % (ref 0–5)
LYMPHS PCT: 17 % (ref 12–46)
Lymphs Abs: 1.5 10*3/uL (ref 0.7–4.0)
MONO ABS: 0.8 10*3/uL (ref 0.1–1.0)
Monocytes Relative: 9 % (ref 3–12)
NEUTROS ABS: 6.2 10*3/uL (ref 1.7–7.7)
NEUTROS PCT: 71 % (ref 43–77)

## 2014-01-14 LAB — COMPREHENSIVE METABOLIC PANEL
ALBUMIN: 3.9 g/dL (ref 3.5–5.2)
ALT: 12 U/L (ref 0–35)
AST: 17 U/L (ref 0–37)
Alkaline Phosphatase: 76 U/L (ref 39–117)
BILIRUBIN TOTAL: 0.3 mg/dL (ref 0.3–1.2)
BUN: 17 mg/dL (ref 6–23)
CHLORIDE: 104 meq/L (ref 96–112)
CO2: 27 mEq/L (ref 19–32)
CREATININE: 0.99 mg/dL (ref 0.50–1.10)
Calcium: 9.7 mg/dL (ref 8.4–10.5)
GFR calc Af Amer: 59 mL/min — ABNORMAL LOW (ref >90)
GFR calc non Af Amer: 51 mL/min — ABNORMAL LOW (ref >90)
Glucose, Bld: 169 mg/dL — ABNORMAL HIGH (ref 70–99)
POTASSIUM: 4.4 meq/L (ref 3.7–5.3)
SODIUM: 145 meq/L (ref 137–147)
Total Protein: 7.2 g/dL (ref 6.0–8.3)

## 2014-01-14 LAB — CBG MONITORING, ED: Glucose-Capillary: 160 mg/dL — ABNORMAL HIGH (ref 70–99)

## 2014-01-14 NOTE — Telephone Encounter (Signed)
Patient advised.

## 2014-01-14 NOTE — ED Notes (Signed)
MD at bedside - Dr. Allen 

## 2014-01-14 NOTE — ED Notes (Signed)
D/c I/v 

## 2014-01-14 NOTE — ED Notes (Signed)
Care transferred, report received.

## 2014-01-14 NOTE — ED Notes (Signed)
Patient transported to CT 

## 2014-01-14 NOTE — ED Notes (Signed)
Notified CBG 160

## 2014-01-14 NOTE — Discharge Instructions (Signed)
Followup with your Dr. next week. Return here for weakness to one side of body, severe headache with vomiting, trouble speaking, trouble walking, or any other problems

## 2014-01-14 NOTE — ED Provider Notes (Signed)
CSN: [id number]     Arrival date & time 01/14/14  0703 History   First MD Initiated Contact with Patient 01/14/14 [phone]     Chief Complaint  Patient presents with  . Numbness     (Consider location/radiation/quality/duration/timing/severity/associated sxs/prior Treatment) The history is provided by the patient.   patient here complaining of entire tongue paresthesias and perioral paresthesias that she does upon awakening this morning. Symptoms have been persistent in no prior history of same. She denies any medications. Denies any teeth pain. Denies any headache or blurred vision. Denies any unilateral weakness or paresthesias. Denies any facial numbness. No trouble closing her eye. No treatment used prior to arrival.  Past Medical History  Diagnosis Date  . Gout 03/1998  . Hyperlipidemia 08/1997  . Hypertension 09/23/1995  . CAD (coronary artery disease)     cath 1998 with 40% lesion in LAD but no other disease  . Diabetes mellitus    Past Surgical History  Procedure Laterality Date  . Vaginal delivery      x 3  . Partial hysterectomy    . Bladder surgery      bladder tack   . Cardiac catheterization  03/1997    40% occulsion lad   . Biopsy breast  03/2000    Ductogram right breast w/ biopsy- obstructing lesion benign by path. (Dr. Annamaria Boots)    Family History  Problem Relation Age of Onset  . Cancer Mother     breast   . Kidney failure Mother   . Cancer Father     colon   . Hypertension Sister   . Cancer Brother     bladder   . Diabetes Neg Hx   . Stroke Neg Hx   . Cancer Brother     lung  . Hypertension Sister    History  Substance Use Topics  . Smoking status: Never Smoker   . Smokeless tobacco: Never Used  . Alcohol Use: No   OB History   Grav Para Term Preterm Abortions TAB SAB Ect Mult Living                 Review of Systems  All other systems reviewed and are negative.      Allergies  Statins and Requip  Home Medications   Current Outpatient  Rx  Name  Route  Sig  Dispense  Refill  . aspirin 325 MG tablet   Oral   Take 325 mg by mouth daily.           . cephALEXin (KEFLEX) 500 MG capsule   Oral   Take 1 capsule (500 mg total) by mouth 3 (three) times daily.   30 capsule   0   . Flaxseed, Linseed, (FLAX SEED OIL) 1000 MG CAPS   Oral   Take 1 capsule by mouth daily.         . Ginger, Zingiber officinalis, (GINGER ROOT) 550 MG CAPS   Oral   Take 3,300 mg by mouth daily. (6 capsules)         . glipiZIDE (GLUCOTROL) 5 MG tablet      Take one half tablet (5mg ) by mouth daily.         Marland Kitchen glipiZIDE (GLUCOTROL) 5 MG tablet      Take one-half tablet by  mouth daily   45 tablet   2   . glucose blood (ONE TOUCH ULTRA TEST) test strip      Test blood sugar once daily or as  needed.  Dx:  250.00   100 each   3   . Lancets (ONETOUCH ULTRASOFT) lancets      Test blood sugar once daily or as needed.  Dx:  250.00         . Multiple Vitamin (MULTIVITAMIN WITH MINERALS) TABS   Oral   Take 1 tablet by mouth daily. ALIVE Womens' 50+         . Omega-3 Fatty Acids (FISH OIL) 1000 MG CAPS   Oral   Take 1,000 mg by mouth 2 (two) times daily.         Marland Kitchen trolamine salicylate (ASPERCREME) 10 % cream   Topical   Apply topically as needed.          BP 195/88  Pulse 94  Temp(Src) 98.2 F (36.8 C) (Oral)  Resp 16  Ht 5\' 4"  (1.626 m)  Wt 180 lb (81.647 kg)  BMI 30.88 kg/m2  SpO2 95% Physical Exam  Nursing note and vitals reviewed. Constitutional: She is oriented to person, place, and time. She appears well-developed and well-nourished.  Non-toxic appearance. No distress.  HENT:  Head: Normocephalic and atraumatic.  Eyes: Conjunctivae, EOM and lids are normal. Pupils are equal, round, and reactive to light.  Neck: Normal range of motion. Neck supple. No tracheal deviation present. No mass present.  Cardiovascular: Normal rate, regular rhythm and normal heart sounds.  Exam reveals no gallop.   No murmur  heard. Pulmonary/Chest: Effort normal and breath sounds normal. No stridor. No respiratory distress. She has no decreased breath sounds. She has no wheezes. She has no rhonchi. She has no rales.  Abdominal: Soft. Normal appearance and bowel sounds are normal. She exhibits no distension. There is no tenderness. There is no rebound and no CVA tenderness.  Musculoskeletal: Normal range of motion. She exhibits no edema and no tenderness.  Neurological: She is alert and oriented to person, place, and time. She has normal strength. No cranial nerve deficit or sensory deficit. Coordination normal. GCS eye subscore is 4. GCS verbal subscore is 5. GCS motor subscore is 6.  Sensation is intact on both sides of her face without facial drooping. Sensation is normal her tongue on both sides.  Skin: Skin is warm and dry. No abrasion and no rash noted.  Psychiatric: She has a normal mood and affect. Her speech is normal and behavior is normal.    ED Course  Procedures (including critical care time) Labs Review Labs Reviewed  CBC  DIFFERENTIAL  COMPREHENSIVE METABOLIC PANEL  CBG MONITORING, ED    Imaging Review No results found.   EKG Interpretation   Date/Time:  Friday January 14 2014 07:40:45 EDT Ventricular Rate:  94 PR Interval:  242 QRS Duration: 81 QT Interval:  355 QTC Calculation: 444 R Axis:   -14 Text Interpretation:  Sinus rhythm Prolonged PR interval Low voltage,  extremity and precordial leads Minimal ST depression, inferior leads  Confirmed by Janaisha Tolsma  MD, Joao Mccurdy ([id number]) on 01/14/2014 8:56:33 AM      MDM   Final diagnoses:  None   Patient's workup. Reassuring. I spoke with patient's grandson who is here and he states that when he tested her that she has normal sensation of her face as well. Her symptoms appear to be subjective. I doubt that this represents a TIA or CVA.     Leota Jacobsen, MD 01/14/14 [phone]

## 2014-01-14 NOTE — ED Notes (Signed)
PER EMS: pt reports she woke up at 2 am and felt normal but then woke up again and 5am with mouth and tongue numbness. Hx of DMII, HTN and TIA with residual right sided facial droop.BP-188/114, HR-80s, RR-18, CBG-172. A&Ox4.

## 2014-01-14 NOTE — Telephone Encounter (Signed)
Okay to keep appointment as is.  Thanks.

## 2014-01-14 NOTE — Telephone Encounter (Signed)
Patient says she went to the ED this morning and was diagnosed with Paresthesias and was told to follow up with you.  Cathy Larsen says you have no appts until Wed or Thurs but pt has appt for CPE next Friday.  Can you please review the ED note and let us know if she needs to come in sooner than Friday?

## 2014-01-18 ENCOUNTER — Encounter: Payer: Medicare Other | Admitting: Family Medicine

## 2014-01-21 ENCOUNTER — Encounter: Payer: Self-pay | Admitting: Family Medicine

## 2014-01-21 ENCOUNTER — Other Ambulatory Visit: Payer: Self-pay

## 2014-01-21 ENCOUNTER — Ambulatory Visit (INDEPENDENT_AMBULATORY_CARE_PROVIDER_SITE_OTHER): Payer: Medicare Other | Admitting: Family Medicine

## 2014-01-21 VITALS — BP 146/80 | HR 96 | Temp 98.2°F | Ht 64.0 in | Wt 185.8 lb

## 2014-01-21 DIAGNOSIS — E2839 Other primary ovarian failure: Secondary | ICD-10-CM

## 2014-01-21 DIAGNOSIS — Z0000 Encounter for general adult medical examination without abnormal findings: Secondary | ICD-10-CM | POA: Insufficient documentation

## 2014-01-21 DIAGNOSIS — Z1231 Encounter for screening mammogram for malignant neoplasm of breast: Secondary | ICD-10-CM

## 2014-01-21 DIAGNOSIS — E119 Type 2 diabetes mellitus without complications: Secondary | ICD-10-CM

## 2014-01-21 DIAGNOSIS — R439 Unspecified disturbances of smell and taste: Secondary | ICD-10-CM

## 2014-01-21 LAB — MICROALBUMIN / CREATININE URINE RATIO
CREATININE, U: 52 mg/dL
Microalb Creat Ratio: 0.8 mg/g (ref 0.0–30.0)
Microalb, Ur: 0.4 mg/dL (ref 0.0–1.9)

## 2014-01-21 MED ORDER — ONETOUCH ULTRASOFT LANCETS MISC: Status: DC

## 2014-01-21 MED ORDER — GLUCOSE BLOOD VI STRP: ORAL_STRIP | Status: DC

## 2014-01-21 NOTE — Assessment & Plan Note (Signed)
See scanned forms.  Routine anticipatory guidance given to patient.  See health maintenance. Flu 2014 Shingles 2009 PNA 1997 Tetanus 2009 Colonoscopy not indicated.  Breast cancer screening pending. She'll call about that.  DXA d/w pt. Ordered.  Advance directive- has a living will, would have grandson Tim ([place]) designated if patient is incapacitated.   Cognitive function addressed- see scanned forms- and if abnormal then additional documentation follows.

## 2014-01-21 NOTE — Progress Notes (Signed)
Pre visit review using our clinic review tool, if applicable. No additional management support is needed unless otherwise documented below in the visit note.  I have personally reviewed the Medicare Annual Wellness questionnaire and have noted 1. The patient's medical and social history 2. Their use of alcohol, tobacco or illicit drugs 3. Their current medications and supplements 4. The patient's functional ability including ADL's, fall risks, home safety risks and hearing or visual             impairment. 5. Diet and physical activities 6. Evidence for depression or mood disorders  The patients weight, height, BMI have been recorded in the chart and visual acuity is per eye clinic.  I have made referrals, counseling and provided education to the patient based review of the above and I have provided the pt with a written personalized care plan for preventive services.  See scanned forms.  Routine anticipatory guidance given to patient.  See health maintenance. Flu 2014 Shingles 2009 PNA 1997 Tetanus 2009 Colonoscopy not indicated.  Breast cancer screening pending. She'll call about that.  DXA d/w pt. Ordered.  Advance directive- has a living will, would have grandson Tim ([place]) designated if patient is incapacitated.   Cognitive function addressed- see scanned forms- and if abnormal then additional documentation follows.   Diabetes:  Using medications without difficulties: yes Hypoglycemic episodes: very rare, controlled with eating on a schedule.  Hyperglycemic episodes: no Feet problems: no Blood Sugars averaging: 120-130s eye exam within last year: pending.  Labs d/w pt.   No lows.  Feels better with current dose of meds.   Numbness of lips and tongue noted.  ER notes reviewed.  It persists.  No focal changes o/w.  Bilateral and the tip of the tongue.  CT head was okay w/o acute changes at ER.  Reviewed.    PMH and SH reviewed  Meds, vitals, and allergies reviewed.   ROS:  See HPI.  Otherwise negative.    GEN: nad, alert and oriented HEENT: mucous membranes moist, OP wnl, normal monofilament exam on the face and normal light touch senstation NECK: supple w/o LA CV: rrr. PULM: ctab, no inc wob ABD: soft, +bs EXT: no edema SKIN: no acute rash CN 2-12 wnl B, S/S/DTR wnl x4   Diabetic foot exam: Normal inspection No skin breakdown No calluses  Normal DP pulses Normal sensation to light touch and monofilament Nails normal

## 2014-01-21 NOTE — Assessment & Plan Note (Signed)
On the tongue and perioral sensation changes that appear subjective. CT neg, exam unremarkable, would follow for now.  If worsening, she'll notify me.  Benign exam.

## 2014-01-21 NOTE — Assessment & Plan Note (Signed)
Controlled, recheck in about 6 months.  Goal to avoid lows. Doing well as is. Labs d/w pt.  She agrees with plan.

## 2014-01-21 NOTE — Patient Instructions (Addendum)
Cathy Larsen will call about your referral for the bone density test.  Go to the lab on the way out.  We'll contact you with your lab report. Take care.  Recheck A1c before a visit in about 6 months.  Glad to see you.  If the numbness gets worse, then let me know.  I wouldn't do anything else in the meantime.

## 2014-01-26 LAB — HM DIABETES EYE EXAM

## 2014-02-01 ENCOUNTER — Telehealth: Payer: Self-pay

## 2014-02-01 NOTE — Telephone Encounter (Signed)
Relevant patient education assigned to patient using Emmi. ° °

## 2014-02-03 ENCOUNTER — Other Ambulatory Visit: Payer: Self-pay | Admitting: Family Medicine

## 2014-02-07 ENCOUNTER — Encounter: Payer: Self-pay | Admitting: Family Medicine

## 2014-03-07 ENCOUNTER — Encounter (INDEPENDENT_AMBULATORY_CARE_PROVIDER_SITE_OTHER): Payer: Self-pay

## 2014-03-07 ENCOUNTER — Encounter: Payer: Self-pay | Admitting: *Deleted

## 2014-03-07 ENCOUNTER — Ambulatory Visit
Admission: RE | Admit: 2014-03-07 | Discharge: 2014-03-07 | Disposition: A | Discharge status: Home / Self Care | Payer: Medicare Other | Source: Ambulatory Visit

## 2014-03-07 ENCOUNTER — Ambulatory Visit
Admission: RE | Admit: 2014-03-07 | Discharge: 2014-03-07 | Disposition: A | Discharge status: Home / Self Care | Payer: Medicare Other | Source: Ambulatory Visit | Attending: Family Medicine | Admitting: Family Medicine

## 2014-03-07 DIAGNOSIS — E2839 Other primary ovarian failure: Secondary | ICD-10-CM

## 2014-03-07 DIAGNOSIS — Z1231 Encounter for screening mammogram for malignant neoplasm of breast: Secondary | ICD-10-CM

## 2014-03-08 ENCOUNTER — Encounter: Payer: Self-pay | Admitting: *Deleted

## 2014-03-08 ENCOUNTER — Other Ambulatory Visit: Payer: Self-pay | Admitting: Orthopedic Surgery

## 2014-03-10 ENCOUNTER — Encounter (HOSPITAL_BASED_OUTPATIENT_CLINIC_OR_DEPARTMENT_OTHER): Payer: Self-pay | Admitting: *Deleted

## 2014-03-11 NOTE — Progress Notes (Signed)
Dr. Orene Desanctis reviewed EKG and history, plans to check pt on Monday. Pt unable to come today for BMET will need I Stat 8 on Monday.

## 2014-03-14 ENCOUNTER — Encounter (HOSPITAL_BASED_OUTPATIENT_CLINIC_OR_DEPARTMENT_OTHER): Payer: Medicare Other | Admitting: Anesthesiology

## 2014-03-14 ENCOUNTER — Encounter (HOSPITAL_BASED_OUTPATIENT_CLINIC_OR_DEPARTMENT_OTHER)
Admission: RE | Disposition: A | Discharge status: Home / Self Care | Payer: Self-pay | Source: Ambulatory Visit | Attending: Orthopedic Surgery

## 2014-03-14 ENCOUNTER — Ambulatory Visit (HOSPITAL_BASED_OUTPATIENT_CLINIC_OR_DEPARTMENT_OTHER)
Admission: RE | Admit: 2014-03-14 | Discharge: 2014-03-14 | Disposition: A | Discharge status: Home / Self Care | Payer: Medicare Other | Source: Ambulatory Visit | Attending: Orthopedic Surgery | Admitting: Orthopedic Surgery

## 2014-03-14 ENCOUNTER — Ambulatory Visit (HOSPITAL_BASED_OUTPATIENT_CLINIC_OR_DEPARTMENT_OTHER): Payer: Medicare Other | Admitting: Anesthesiology

## 2014-03-14 ENCOUNTER — Encounter (HOSPITAL_BASED_OUTPATIENT_CLINIC_OR_DEPARTMENT_OTHER): Payer: Self-pay | Admitting: *Deleted

## 2014-03-14 DIAGNOSIS — I10 Essential (primary) hypertension: Secondary | ICD-10-CM | POA: Insufficient documentation

## 2014-03-14 DIAGNOSIS — Z881 Allergy status to other antibiotic agents status: Secondary | ICD-10-CM | POA: Insufficient documentation

## 2014-03-14 DIAGNOSIS — Z7982 Long term (current) use of aspirin: Secondary | ICD-10-CM | POA: Insufficient documentation

## 2014-03-14 DIAGNOSIS — M19079 Primary osteoarthritis, unspecified ankle and foot: Secondary | ICD-10-CM | POA: Insufficient documentation

## 2014-03-14 DIAGNOSIS — E119 Type 2 diabetes mellitus without complications: Secondary | ICD-10-CM | POA: Insufficient documentation

## 2014-03-14 DIAGNOSIS — Z79899 Other long term (current) drug therapy: Secondary | ICD-10-CM | POA: Insufficient documentation

## 2014-03-14 DIAGNOSIS — Z888 Allergy status to other drugs, medicaments and biological substances status: Secondary | ICD-10-CM | POA: Insufficient documentation

## 2014-03-14 DIAGNOSIS — I2510 Atherosclerotic heart disease of native coronary artery without angina pectoris: Secondary | ICD-10-CM | POA: Insufficient documentation

## 2014-03-14 DIAGNOSIS — E785 Hyperlipidemia, unspecified: Secondary | ICD-10-CM | POA: Insufficient documentation

## 2014-03-14 DIAGNOSIS — M109 Gout, unspecified: Secondary | ICD-10-CM | POA: Insufficient documentation

## 2014-03-14 DIAGNOSIS — G5600 Carpal tunnel syndrome, unspecified upper limb: Secondary | ICD-10-CM | POA: Insufficient documentation

## 2014-03-14 DIAGNOSIS — M19049 Primary osteoarthritis, unspecified hand: Secondary | ICD-10-CM | POA: Insufficient documentation

## 2014-03-14 HISTORY — DX: Unspecified osteoarthritis, unspecified site: M19.90

## 2014-03-14 HISTORY — PX: CARPAL TUNNEL RELEASE: SHX101

## 2014-03-14 LAB — POCT I-STAT, CHEM 8
BUN: 22 mg/dL (ref 6–23)
CHLORIDE: 102 meq/L (ref 96–112)
CREATININE: 1.3 mg/dL — AB (ref 0.50–1.10)
Calcium, Ion: 1.2 mmol/L (ref 1.13–1.30)
Glucose, Bld: 143 mg/dL — ABNORMAL HIGH (ref 70–99)
HCT: 44 % (ref 36.0–46.0)
HEMOGLOBIN: 15 g/dL (ref 12.0–15.0)
POTASSIUM: 4.1 meq/L (ref 3.7–5.3)
SODIUM: 141 meq/L (ref 137–147)
TCO2: 29 mmol/L (ref 0–100)

## 2014-03-14 LAB — GLUCOSE, CAPILLARY: GLUCOSE-CAPILLARY: 175 mg/dL — AB (ref 70–99)

## 2014-03-14 SURGERY — CARPAL TUNNEL RELEASE
Anesthesia: Monitor Anesthesia Care | Site: Wrist | Laterality: Right

## 2014-03-14 MED ORDER — BUPIVACAINE HCL (PF) 0.25 % IJ SOLN
INTRAMUSCULAR | Status: DC | PRN
  Administered 2014-03-14: 8 mL

## 2014-03-14 MED ORDER — HYDROCODONE-ACETAMINOPHEN 5-325 MG PO TABS: ORAL_TABLET | ORAL | Status: DC

## 2014-03-14 MED ORDER — LIDOCAINE HCL (CARDIAC) 20 MG/ML IV SOLN
INTRAVENOUS | Status: DC | PRN
  Administered 2014-03-14: 50 mg via INTRAVENOUS

## 2014-03-14 MED ORDER — VANCOMYCIN HCL IN DEXTROSE 1-5 GM/200ML-% IV SOLN
1000.0000 mg | INTRAVENOUS | Status: AC
  Administered 2014-03-14: 1000 mg via INTRAVENOUS

## 2014-03-14 MED ORDER — FENTANYL CITRATE 0.05 MG/ML IJ SOLN
INTRAMUSCULAR | Status: AC
  Filled 2014-03-14: qty 4

## 2014-03-14 MED ORDER — FENTANYL CITRATE 0.05 MG/ML IJ SOLN
INTRAMUSCULAR | Status: DC | PRN
  Administered 2014-03-14: 50 ug via INTRAVENOUS
  Administered 2014-03-14: 25 ug via INTRAVENOUS

## 2014-03-14 MED ORDER — PROPOFOL 10 MG/ML IV BOLUS
INTRAVENOUS | Status: AC
  Filled 2014-03-14: qty 20

## 2014-03-14 MED ORDER — FENTANYL CITRATE 0.05 MG/ML IJ SOLN: 25.0000 ug | INTRAMUSCULAR | Status: DC | PRN

## 2014-03-14 MED ORDER — PROMETHAZINE HCL 25 MG/ML IJ SOLN: 6.2500 mg | INTRAMUSCULAR | Status: DC | PRN

## 2014-03-14 MED ORDER — LACTATED RINGERS IV SOLN
INTRAVENOUS | Status: DC
  Administered 2014-03-14: 08:00:00 via INTRAVENOUS

## 2014-03-14 MED ORDER — BUPIVACAINE HCL (PF) 0.25 % IJ SOLN
INTRAMUSCULAR | Status: AC
  Filled 2014-03-14: qty 30

## 2014-03-14 MED ORDER — ONDANSETRON HCL 4 MG/2ML IJ SOLN
INTRAMUSCULAR | Status: DC | PRN
  Administered 2014-03-14: 4 mg via INTRAVENOUS

## 2014-03-14 MED ORDER — FENTANYL CITRATE 0.05 MG/ML IJ SOLN: 50.0000 ug | INTRAMUSCULAR | Status: DC | PRN

## 2014-03-14 MED ORDER — CHLORHEXIDINE GLUCONATE 4 % EX LIQD: 60.0000 mL | Freq: Once | CUTANEOUS | Status: DC

## 2014-03-14 MED ORDER — VANCOMYCIN HCL IN DEXTROSE 1-5 GM/200ML-% IV SOLN
INTRAVENOUS | Status: AC
  Filled 2014-03-14: qty 200

## 2014-03-14 MED ORDER — PROPOFOL INFUSION 10 MG/ML OPTIME
INTRAVENOUS | Status: DC | PRN
  Administered 2014-03-14: 75 ug/kg/min via INTRAVENOUS

## 2014-03-14 MED ORDER — MIDAZOLAM HCL 2 MG/2ML IJ SOLN: 1.0000 mg | INTRAMUSCULAR | Status: DC | PRN

## 2014-03-14 SURGICAL SUPPLY — 37 items
BANDAGE ELASTIC 3 VELCRO ST LF (GAUZE/BANDAGES/DRESSINGS) ×2 IMPLANT
BLADE 15 SAFETY STRL DISP (BLADE) ×3 IMPLANT
BLADE MINI RND TIP GREEN BEAV (BLADE) IMPLANT
BNDG CMPR 9X4 STRL LF SNTH (GAUZE/BANDAGES/DRESSINGS) ×1
BNDG ESMARK 4X9 LF (GAUZE/BANDAGES/DRESSINGS) ×1 IMPLANT
BNDG GAUZE ELAST 4 BULKY (GAUZE/BANDAGES/DRESSINGS) ×2 IMPLANT
CHLORAPREP W/TINT 26ML (MISCELLANEOUS) ×2 IMPLANT
CORDS BIPOLAR (ELECTRODE) ×2 IMPLANT
COVER MAYO STAND STRL (DRAPES) ×2 IMPLANT
COVER TABLE BACK 60X90 (DRAPES) ×2 IMPLANT
CUFF TOURNIQUET SINGLE 18IN (TOURNIQUET CUFF) ×2 IMPLANT
DRAPE EXTREMITY T 121X128X90 (DRAPE) ×2 IMPLANT
DRAPE SURG 17X23 STRL (DRAPES) ×2 IMPLANT
DRSG PAD ABDOMINAL 8X10 ST (GAUZE/BANDAGES/DRESSINGS) ×2 IMPLANT
GAUZE SPONGE 4X4 12PLY STRL (GAUZE/BANDAGES/DRESSINGS) ×2 IMPLANT
GAUZE XEROFORM 1X8 LF (GAUZE/BANDAGES/DRESSINGS) ×2 IMPLANT
GLOVE BIO SURGEON STRL SZ7.5 (GLOVE) ×2 IMPLANT
GLOVE BIOGEL PI IND STRL 7.0 (GLOVE) IMPLANT
GLOVE BIOGEL PI IND STRL 8 (GLOVE) ×1 IMPLANT
GLOVE BIOGEL PI INDICATOR 7.0 (GLOVE) ×1
GLOVE BIOGEL PI INDICATOR 8 (GLOVE) ×1
GLOVE ECLIPSE 6.5 STRL STRAW (GLOVE) ×1 IMPLANT
GOWN STRL REUS W/ TWL LRG LVL3 (GOWN DISPOSABLE) ×1 IMPLANT
GOWN STRL REUS W/TWL LRG LVL3 (GOWN DISPOSABLE) ×2
GOWN STRL REUS W/TWL XL LVL3 (GOWN DISPOSABLE) ×2 IMPLANT
NDL HYPO 25X1 1.5 SAFETY (NEEDLE) IMPLANT
NEEDLE HYPO 25X1 1.5 SAFETY (NEEDLE) ×2 IMPLANT
NS IRRIG 1000ML POUR BTL (IV SOLUTION) ×2 IMPLANT
PACK BASIN DAY SURGERY FS (CUSTOM PROCEDURE TRAY) ×2 IMPLANT
PADDING CAST ABS 4INX4YD NS (CAST SUPPLIES)
PADDING CAST ABS COTTON 4X4 ST (CAST SUPPLIES) ×1 IMPLANT
STOCKINETTE 4X48 STRL (DRAPES) ×2 IMPLANT
SUT ETHILON 4 0 PS 2 18 (SUTURE) ×2 IMPLANT
SYR BULB 3OZ (MISCELLANEOUS) ×2 IMPLANT
SYR CONTROL 10ML LL (SYRINGE) ×1 IMPLANT
TOWEL OR 17X24 6PK STRL BLUE (TOWEL DISPOSABLE) ×4 IMPLANT
UNDERPAD 30X30 INCONTINENT (UNDERPADS AND DIAPERS) ×2 IMPLANT

## 2014-03-14 NOTE — H&P (Signed)
Cathy Larsen is an 80 y.o. female.   Chief Complaint: carpal tunnel syndrome HPI: 80 yo rhd female with numbness in fingertips.  Nocturnal symptoms that wake her 2-3 times per week.  Positive nerve conduction studies.  She wishes to have a right carpal tunnel release.  Past Medical History  Diagnosis Date  . Gout 03/1998  . Hyperlipidemia 08/1997  . Hypertension 09/23/1995  . CAD (coronary artery disease)     cath 1998 with 40% lesion in LAD but no other disease  . Diabetes mellitus   . Arthritis     feet and hands    Past Surgical History  Procedure Laterality Date  . Vaginal delivery      x 3  . Partial hysterectomy    . Bladder surgery      bladder tack   . Cardiac catheterization  03/1997    40% occulsion lad   . Biopsy breast  03/2000    Ductogram right breast w/ biopsy- obstructing lesion benign by path. (Dr. Annamaria Boots)   . Cataract extraction      Family History  Problem Relation Age of Onset  . Cancer Mother     breast   . Kidney failure Mother   . Cancer Father     colon   . Hypertension Sister   . Cancer Brother     bladder   . Diabetes Neg Hx   . Stroke Neg Hx   . Cancer Brother     lung  . Hypertension Sister    Social History:  reports that she has never smoked. She has never used smokeless tobacco. She reports that she does not drink alcohol or use illicit drugs.  Allergies:  Allergies  Allergen Reactions  . Statins     Myalgias.   . Keflex [Cephalexin]     Migraine  . Requip [Ropinirole Hcl]     headache    Medications Prior to Admission  Medication Sig Dispense Refill  . aspirin 325 MG tablet Take 325 mg by mouth daily.        . Flaxseed, Linseed, (FLAX SEED OIL) 1000 MG CAPS Take 1 capsule by mouth daily.      . Ginger, Zingiber officinalis, (GINGER ROOT) 550 MG CAPS Take 3,300 mg by mouth daily. (6 capsules)      . glipiZIDE (GLUCOTROL) 5 MG tablet Take 2.5 mg by mouth daily before breakfast. Take one half tablet (5mg ) by mouth daily.       . Multiple Vitamin (MULTIVITAMIN WITH MINERALS) TABS Take 1 tablet by mouth daily. ALIVE Womens' 50+      . Omega-3 Fatty Acids (FISH OIL) 1000 MG CAPS Take 1,000 mg by mouth 2 (two) times daily.      Marland Kitchen trolamine salicylate (ASPERCREME) 10 % cream Apply topically as needed.      . Lancets (ONETOUCH ULTRASOFT) lancets TEST BS 3X DAILY AS DIRECTED  100 each  2  . ONE TOUCH ULTRA TEST test strip USE DAILY TO CHECK SUGAR  100 each  2    Results for orders placed during the hospital encounter of 03/14/14 (from the past 48 hour(s))  POCT I-STAT, CHEM 8     Status: Abnormal   Collection Time    03/14/14  7:57 AM      Result Value Ref Range   Sodium 141  137 - 147 mEq/L   Potassium 4.1  3.7 - 5.3 mEq/L   Chloride 102  96 - 112 mEq/L   BUN  22  6 - 23 mg/dL   Creatinine, Ser 1.30 (*) 0.50 - 1.10 mg/dL   Glucose, Bld 143 (*) 70 - 99 mg/dL   Calcium, Ion 1.20  1.13 - 1.30 mmol/L   TCO2 29  0 - 100 mmol/L   Hemoglobin 15.0  12.0 - 15.0 g/dL   HCT 44.0  36.0 - 46.0 %    No results found.   A comprehensive review of systems was negative except for: Eyes: positive for contacts/glasses Hematologic/lymphatic: positive for easy bruising  Blood pressure 177/94, pulse 81, temperature 97.7 F (36.5 C), temperature source Oral, resp. rate 20, height 5\' 3"  (1.6 m), weight 83.915 kg (185 lb), SpO2 96.00%.  General appearance: alert, cooperative and appears stated age Head: Normocephalic, without obvious abnormality, atraumatic Neck: supple, symmetrical, trachea midline Resp: clear to auscultation bilaterally Cardio: regular rate and rhythm GI: non tender Extremities: intact sensation and capillary refill all digits. +epl/fpl/io.  no wounds. Pulses: 2+ and symmetric Skin: Skin color, texture, turgor normal. No rashes or lesions Neurologic: Grossly normal Incision/Wound: none  Assessment/Plan Right carpal tunnel syndrome.  Non operative and operative treatment options were discussed with the  patient and patient wishes to proceed with operative treatment. Risks, benefits, and alternatives of surgery were discussed and the patient agrees with the plan of care.   Tennis Must 03/14/2014, 8:27 AM

## 2014-03-14 NOTE — Discharge Instructions (Addendum)

## 2014-03-14 NOTE — Transfer of Care (Signed)
Immediate Anesthesia Transfer of Care Note  Patient: Cathy Larsen  Procedure(s) Performed: Procedure(s): RIGHT CARPAL TUNNEL RELEASE (Right)  Patient Location: PACU  Anesthesia Type:MAC and Bier block  Level of Consciousness: awake, sedated and patient cooperative  Airway & Oxygen Therapy: Patient Spontanous Breathing and Patient connected to face mask oxygen  Post-op Assessment: Report given to PACU RN and Post -op Vital signs reviewed and stable  Post vital signs: Reviewed and stable  Complications: No apparent anesthesia complications

## 2014-03-14 NOTE — Op Note (Signed)
[id number] 

## 2014-03-14 NOTE — Brief Op Note (Signed)
03/14/2014  9:11 AM  PATIENT:  Cathy Larsen  80 y.o. female  PRE-OPERATIVE DIAGNOSIS:  right carpal tunnel syndrome  POST-OPERATIVE DIAGNOSIS:  Right Carpal tunnel syndrome  PROCEDURE:  Procedure(s): RIGHT CARPAL TUNNEL RELEASE (Right)  SURGEON:  Surgeon(s) and Role:    * Tennis Must, MD - Primary  PHYSICIAN ASSISTANT:   ASSISTANTS: none   ANESTHESIA:   Bier block with sedation  EBL:     BLOOD ADMINISTERED:none  DRAINS: none   LOCAL MEDICATIONS USED:  MARCAINE     SPECIMEN:  No Specimen  DISPOSITION OF SPECIMEN:  N/A  COUNTS:  YES  TOURNIQUET:   Total Tourniquet Time Documented: Forearm (Right) - 26 minutes Total: Forearm (Right) - 26 minutes   DICTATION: .Other Dictation: Dictation Number [id number]  PLAN OF CARE: Discharge to home after PACU  PATIENT DISPOSITION:  PACU - hemodynamically stable.

## 2014-03-14 NOTE — Anesthesia Preprocedure Evaluation (Signed)
Anesthesia Evaluation  Patient identified by MRN, date of birth, ID band Patient awake    Reviewed: Allergy & Precautions, H&P , Patient's Chart, lab work & pertinent test results  History of Anesthesia Complications Negative for: history of anesthetic complications  Airway Mallampati: II  Neck ROM: Full    Dental   Pulmonary neg pulmonary ROS,  breath sounds clear to auscultation        Cardiovascular hypertension, + CAD Rhythm:Regular Rate:Normal     Neuro/Psych TIA   GI/Hepatic   Endo/Other  diabetes  Renal/GU Renal InsufficiencyRenal disease     Musculoskeletal   Abdominal   Peds  Hematology   Anesthesia Other Findings   Reproductive/Obstetrics                           Anesthesia Physical Anesthesia Plan  ASA: III  Anesthesia Plan: MAC and Bier Block   Post-op Pain Management:    Induction:   Airway Management Planned: Natural Airway  Additional Equipment:   Intra-op Plan:   Post-operative Plan:   Informed Consent: I have reviewed the patients History and Physical, chart, labs and discussed the procedure including the risks, benefits and alternatives for the proposed anesthesia with the patient or authorized representative who has indicated his/her understanding and acceptance.   Dental advisory given  Plan Discussed with: CRNA and Surgeon  Anesthesia Plan Comments:         Anesthesia Quick Evaluation

## 2014-03-15 ENCOUNTER — Encounter (HOSPITAL_BASED_OUTPATIENT_CLINIC_OR_DEPARTMENT_OTHER): Payer: Self-pay | Admitting: Orthopedic Surgery

## 2014-03-15 NOTE — Op Note (Signed)
NAMEDEKLYNN, CHARLET NO.:  [id number]  MEDICAL RECORD NO.:  [id number]  LOCATION:                                 FACILITY:  PHYSICIAN:  Leanora Cover, MD        DATE OF BIRTH:  1926/06/30  DATE OF PROCEDURE:  03/14/2014 DATE OF DISCHARGE:                              OPERATIVE REPORT   PREOPERATIVE DIAGNOSIS:  Right carpal tunnel syndrome.  POSTOP DIAGNOSIS:  Right carpal tunnel syndrome.  PROCEDURE:  Right carpal tunnel release.  SURGEON:  Leanora Cover, MD  ASSISTANT:  None.  ANESTHESIA:  Bier block with sedation.  IV FLUIDS:  Per anesthesia flow sheet.  ESTIMATED BLOOD LOSS:  Minimal.  COMPLICATIONS:  None.  SPECIMENS:  None.  TOURNIQUET TIME:  26 minutes.  DISPOSITION:  Stable to PACU.  INDICATIONS:  Ms. Amodei is a 80 year old right-hand dominant female, who has had numbness in her fingertips.  She had nocturnal symptoms that wake her and cause her to shake her hand to get relief.  She has positive nerve conduction studies.  She wished to have a carpal tunnel release for management symptoms.  Risks, benefits, and alternatives of surgery were discussed including risk of blood loss, infection, damage to nerves, vessels, tendons, ligaments, bone; failure of surgery; need for additional surgery, complications with wound healing, continued pain, continued carpal tunnel syndrome.  She voiced understanding of these risks and elected to proceed.  OPERATIVE COURSE:  After being identified preoperatively by myself, the patient and I agreed upon procedure and site of procedure.  Surgical site was marked.  The risks, benefits, and alternatives of surgery were reviewed and she wished to proceed.  Surgical consent had been signed. She was given IV Ancef as preoperative antibiotic prophylaxis.  She was transferred to the operating room and placed on the operating room table in supine position with the right upper extremity on arm board.  Bier block  anesthesia was induced by Anesthesiologist.  The right upper extremity was prepped and draped in normal sterile orthopedic fashion. A surgical pause was performed between surgeons, anesthesia, operating staff, and all were in agreement as to the patient, procedure, site of procedure.  Tourniquet at the proximal aspect of the forearm had been inflated for the Bier block.  Incision was made over the transverse carpal ligament and carried into subcutaneous tissues by spreading technique.  Bipolar electrocautery was used to obtain hemostasis.  The palmar fascia was incised.  The transverse carpal ligament was identified and was incised sharply.  It was incised distally first. Care was taken to her to ensure complete decompression distally.  It was then incised proximally.  Scissors were used to split the distal aspect of the volar antebrachial fascia.  A finger was placed into the wound to ensure complete decompression as well the case.  The nerve was inspected.  It was hyperemic and flattened.  The motor branch was identified and was intact.  The wound was copiously irrigated with sterile saline.  It was closed with 4-0 nylon in a horizontal mattress fashion.  It was injected with 8 mL of 0.25% plain Marcaine in postoperative analgesia.  It was dressed with  sterile Xeroform, 4x4s, and ABD and wrapped with Kerlix and Ace bandage.  The tourniquet was deflated at 26 minutes.  Fingertips were pink with brisk capillary refill after deflation of tourniquet.  Operative drapes were broken down.  The patient was awoken from sedation safely.  She was transferred back to stretcher and taken to PACU in stable condition.  I will see her back in the office in 1 week for postoperative follow up.  I have given her Norco 5/325 one to two p.o. q.6 hours p.r.n. pain, dispensed #30.     Leanora Cover, MD     KK/MEDQ  D:  03/14/2014  T:  03/15/2014  Job:  [id number]

## 2014-03-15 NOTE — Anesthesia Postprocedure Evaluation (Signed)
  Anesthesia Post-op Note  Patient: Cathy Larsen  Procedure(s) Performed: Procedure(s): RIGHT CARPAL TUNNEL RELEASE (Right)  Patient Location: PACU and Short Stay  Anesthesia Type:General  Level of Consciousness: awake and alert   Airway and Oxygen Therapy: Patient Spontanous Breathing  Post-op Pain: none  Post-op Assessment: Post-op Vital signs reviewed  Post-op Vital Signs: stable  Last Vitals:  Filed Vitals:   03/14/14 1016  BP: 177/81  Pulse:   Temp: 36.4 C  Resp: 16    Complications: No apparent anesthesia complications

## 2014-03-23 ENCOUNTER — Encounter (HOSPITAL_COMMUNITY): Payer: Self-pay | Admitting: Pharmacy Technician

## 2014-04-01 NOTE — Discharge Instructions (Signed)
Cathy Larsen  04/01/2014     Instructions    Activity: No Restrictions.   Diet: Resume Diet you were on at home.   Pain Medication: Tylenol if Needed.   CONTACT YOUR DOCTOR IF YOU HAVE PAIN, REDNESS IN YOUR EYE, OR DECREASED VISION.   Follow-up:05/03/2014 @ 2:45pm with Williams Che, MD.     Dr. Iona Hansen: [phone]     If you find that you cannot contact your physician, but feel that your signs and   Symptoms warrant a physician's attention, call the Emergency Room at   [phone] ext.[phone].   Other{NA AND OHKGOVPC:34035}.

## 2014-04-04 ENCOUNTER — Ambulatory Visit (HOSPITAL_COMMUNITY)
Admission: RE | Admit: 2014-04-04 | Discharge: 2014-04-04 | Disposition: A | Discharge status: Home / Self Care | Payer: Medicare Other | Source: Ambulatory Visit | Attending: Ophthalmology | Admitting: Ophthalmology

## 2014-04-04 ENCOUNTER — Encounter (HOSPITAL_COMMUNITY): Payer: Self-pay | Admitting: *Deleted

## 2014-04-04 ENCOUNTER — Encounter (HOSPITAL_COMMUNITY)
Admission: RE | Disposition: A | Discharge status: Home / Self Care | Payer: Self-pay | Source: Ambulatory Visit | Attending: Ophthalmology

## 2014-04-04 DIAGNOSIS — H2640 Unspecified secondary cataract: Secondary | ICD-10-CM | POA: Insufficient documentation

## 2014-04-04 HISTORY — PX: YAG LASER APPLICATION: SHX6189

## 2014-04-04 SURGERY — TREATMENT, USING YAG LASER
Anesthesia: LOCAL | Laterality: Right

## 2014-04-04 MED ORDER — APRACLONIDINE HCL 1 % OP SOLN
1.0000 [drp] | OPHTHALMIC | Status: AC
  Administered 2014-04-04 (×2): 1 [drp] via OPHTHALMIC

## 2014-04-04 MED ORDER — TETRACAINE HCL 0.5 % OP SOLN
1.0000 [drp] | Freq: Once | OPHTHALMIC | Status: AC
  Administered 2014-04-04: 1 [drp] via OPHTHALMIC

## 2014-04-04 MED ORDER — TETRACAINE HCL 0.5 % OP SOLN
OPHTHALMIC | Status: AC
  Filled 2014-04-04: qty 2

## 2014-04-04 MED ORDER — TROPICAMIDE 1 % OP SOLN
OPHTHALMIC | Status: AC
  Filled 2014-04-04: qty 3

## 2014-04-04 MED ORDER — TROPICAMIDE 1 % OP SOLN
1.0000 [drp] | OPHTHALMIC | Status: AC
  Administered 2014-04-04 (×2): 1 [drp] via OPHTHALMIC

## 2014-04-04 MED ORDER — APRACLONIDINE HCL 1 % OP SOLN
OPHTHALMIC | Status: AC
  Filled 2014-04-04: qty 0.1

## 2014-04-04 NOTE — Op Note (Signed)
None required

## 2014-04-04 NOTE — Brief Op Note (Signed)
Cathy Larsen 04/04/2014  Williams Che, MD  Yag Laser Self Test Completedyes. Procedure: Posterior Capsulotomy, right eye.  Eye Protection Worn by Staff yes. Laser In Use Sign on Door yes.  Laser: Nd:YAG Spot Size: Fixed Burst Mode: III Power Setting: 1.7 mJ/burst Position treated: posterior capsule Number of shots: 63 Total energy delivered: 105 mJ  Patency of the capsulotomy was confirmed visually.  The patient tolerated the procedure without difficulty. No complications were encountered.   The patient was discharged home with the instructions to continue all her current glaucoma medications, if any. .  Patient verbalizes understanding of discharge instructions yes.   Notes:Elschnig's pearls noted in visual axis

## 2014-04-04 NOTE — H&P (Signed)
I have reviewed the pre printed H&P, the patient was re-examined, and I have identified no significant interval changes in the patient's medical condition.  There is no change in the plan of care since the history and physical of record. 

## 2014-04-05 ENCOUNTER — Encounter (HOSPITAL_COMMUNITY): Payer: Self-pay | Admitting: Ophthalmology

## 2014-05-25 ENCOUNTER — Other Ambulatory Visit: Payer: Self-pay | Admitting: Family Medicine

## 2014-07-14 ENCOUNTER — Other Ambulatory Visit: Payer: Self-pay | Admitting: Family Medicine

## 2014-07-14 DIAGNOSIS — E119 Type 2 diabetes mellitus without complications: Secondary | ICD-10-CM

## 2014-07-20 ENCOUNTER — Other Ambulatory Visit (INDEPENDENT_AMBULATORY_CARE_PROVIDER_SITE_OTHER): Payer: Medicare Other

## 2014-07-20 DIAGNOSIS — E119 Type 2 diabetes mellitus without complications: Secondary | ICD-10-CM

## 2014-07-20 LAB — HEMOGLOBIN A1C: Hgb A1c MFr Bld: 6.8 % — ABNORMAL HIGH (ref 4.6–6.5)

## 2014-07-27 ENCOUNTER — Encounter (INDEPENDENT_AMBULATORY_CARE_PROVIDER_SITE_OTHER): Payer: Self-pay

## 2014-07-27 ENCOUNTER — Ambulatory Visit (INDEPENDENT_AMBULATORY_CARE_PROVIDER_SITE_OTHER): Payer: Medicare Other | Admitting: Family Medicine

## 2014-07-27 ENCOUNTER — Encounter: Payer: Self-pay | Admitting: Family Medicine

## 2014-07-27 VITALS — BP 138/88 | HR 88 | Temp 98.3°F | Wt 185.0 lb

## 2014-07-27 DIAGNOSIS — Z23 Encounter for immunization: Secondary | ICD-10-CM

## 2014-07-27 DIAGNOSIS — E119 Type 2 diabetes mellitus without complications: Secondary | ICD-10-CM

## 2014-07-27 MED ORDER — GLIPIZIDE 5 MG PO TABS: ORAL_TABLET | ORAL | Status: DC

## 2014-07-27 NOTE — Patient Instructions (Signed)
Recheck labs in about 6 months before a physical.  Don't change your meds.  Your A1c was fine.  Take care.  Glad to see you.

## 2014-07-27 NOTE — Progress Notes (Signed)
Pre visit review using our clinic review tool, if applicable. No additional management support is needed unless otherwise documented below in the visit note.  Diabetes:  Using medications without difficulties: yes, doing well with 1/2 tab a day.  She feels well.   Hypoglycemic episodes:no Hyperglycemic episodes:no Feet problems:she has some irritation at the nails, improved with salt water soaks.  She is going to f/u with podiatry.  Blood Sugars averaging: 120-140 eye exam within last year: yes.  A1c stable, <7.  D/w pt.   She is still independent.  Her mood is "alright.  I try to keep myself going."  She had a good birthday.  She is taking some trips with her church.  Family has been supportive.  She is working on her diet.    She has some R knee pain when she is in the car but o/w doing well.  Walking well o/w.    Her R hand sx are better after surgery from Dr. Fredna Dow.  She is considering surgery on the L wrist.   Meds, vitals, and allergies reviewed.   ROS: See HPI.  Otherwise negative.    GEN: nad, alert and oriented HEENT: mucous membranes moist NECK: supple w/o LA CV: rrr. PULM: ctab, no inc wob ABD: soft, +bs EXT: no edema SKIN: no acute rash  Diabetic foot exam: Normal inspection No skin breakdown No calluses  Normal DP pulses Normal sensation to light touch and monofilament Nails normal

## 2014-07-28 NOTE — Assessment & Plan Note (Signed)
A1c stable, d/w pt.  Continue diet current routine. D/w pt.  Doing well.  F/u in about 39months.  She agrees.

## 2014-09-19 ENCOUNTER — Ambulatory Visit (INDEPENDENT_AMBULATORY_CARE_PROVIDER_SITE_OTHER): Payer: Medicare Other | Admitting: Family Medicine

## 2014-09-19 ENCOUNTER — Encounter: Payer: Self-pay | Admitting: Family Medicine

## 2014-09-19 VITALS — BP 180/96 | HR 92 | Temp 98.8°F | Wt 187.2 lb

## 2014-09-19 DIAGNOSIS — I10 Essential (primary) hypertension: Secondary | ICD-10-CM

## 2014-09-19 DIAGNOSIS — M25561 Pain in right knee: Secondary | ICD-10-CM

## 2014-09-19 NOTE — Progress Notes (Signed)
Pre visit review using our clinic review tool, if applicable. No additional management support is needed unless otherwise documented below in the visit note.  R knee pain.  Some better with OTC aspercreme with lidocaine for the last week.   Pain stared after walking more than usual at a [employer], was walking on a hard surface.   H/o R knee scope years ago.   R calf "feels tired" and the knee gets puffy.   The knee will occ have sticking with ROM, not a smooth ROM.  No bruising.   More posterior pain than anterior pain now, but she did have some anterior pain previously, esp with knee flexion >90deg.  No trauma.  No falls.   She can still bear weight but she has used a can recently.   BP checks have been much lower recently at home.  She admits to being "in a tizzy" over thanksgiving and travel/family concerns, along with knee pain.  Usually 150/60s at home on recent checks.   Meds, vitals, and allergies reviewed.   ROS: See HPI.  Otherwise, noncontributory.  nad rrr ctab abd soft R knee exam: no acute changes on inspection, not bruised.  Joint lines not ttp.  Does have pain with flexion >90d and pain on medial meniscus testing.   No locking on PROM. ACL feels solid. Calf not ttp

## 2014-09-19 NOTE — Patient Instructions (Signed)
Check your BP at home and if it isn't a lot lower then notify me.  Rosaria Ferries will call about your referral. Take care.  Keep using the aspercreme in the meantime.

## 2014-09-20 DIAGNOSIS — M25561 Pain in right knee: Secondary | ICD-10-CM | POA: Insufficient documentation

## 2014-09-20 NOTE — Assessment & Plan Note (Signed)
Very likely meniscal pathology.  Keep using the aspercreme in the meantime and refer to ortho. I didn't image since we couldn't get weightbearing films.   Referral d/w pt.  She agrees.

## 2014-09-20 NOTE — Assessment & Plan Note (Signed)
BPs have been much lower on home checks recently and she'll continue checks at home.  She'll notify me if still up.  She agrees.

## 2014-09-22 ENCOUNTER — Telehealth: Payer: Self-pay | Admitting: Family Medicine

## 2014-09-22 ENCOUNTER — Telehealth: Payer: Self-pay

## 2014-09-22 MED ORDER — LISINOPRIL 10 MG PO TABS: 10.0000 mg | ORAL_TABLET | Freq: Every day | ORAL | Status: DC

## 2014-09-22 NOTE — Telephone Encounter (Signed)
Patient Information:  Caller Name: Octavia Bruckner  Phone: [phone]  Patient: Cathy Larsen, Cathy Larsen  Gender: Female  DOB: 07/24/1926  Age: 80 Years  PCP: Elsie Stain Brigitte Pulse) [facility])  Office Follow Up:  Does the office need to follow up with this patient?: Yes  Instructions For The Office: Please f/u with pt to schedule appt for tomorrow. Thank you.  RN Note:  Per Dr Josefine Class request contacted the office to advise of blood pressure readings, spoke with Sterling. Pt reports she feels completely normal and good at this time. Rena advised per Dr Damita Dunnings advised grandson/Tim Rx Lisinopril will be called in and appt scheduled for f/u tomorrow.  Symptoms  Reason For Call & Symptoms: Pt's grandson/Tim reports pt's blood pressure is elevated 181/91 and 197/94, with headache. and facial flushing.    Tim also reports pt was given cortizone injection, knee 09/21/14 .  Tim also states Dr Damita Dunnings wanted to know if BP is continuing to be elevated.  Reviewed Health History In EMR: Yes  Reviewed Medications In EMR: Yes  Reviewed Allergies In EMR: Yes  Reviewed Surgeries / Procedures: Yes  Date of Onset of Symptoms: 09/21/2014  Guideline(s) Used:  High Blood Pressure  Disposition Per Guideline:   See Within 2 Weeks in Office  Reason For Disposition Reached:   BP > 140/90 and is taking BP medications  Advice Given:  N/A  Patient Will Follow Care Advice:  YES

## 2014-09-22 NOTE — Telephone Encounter (Signed)
Spoke with Dr Damita Dunnings and if pt having CP, SOB or not feeling normal pt should go to ED for eval but if pt is feeling normal then I can call in Lisinopril 10 mg taking one tablet by mouth daily #30 x 2 refills and pt needs to be seen [facility] on 09/23/14 or 09/26/14. Erma said pt stated she feels good and normal now and request med sent to [facility] and pt scheduled appt with Dr Damita Dunnings 09/23/14; med sent electronically as instructed to [place].

## 2014-09-22 NOTE — Telephone Encounter (Signed)
Erma with CAN has pts grandson on phone, pt was seen earlier today for cortisone injection; 2 hours ago BP 197/94, pt had severe h/a and facial flushing, now BP 181/91 and h/a is gone but face is still pink. BP seemed to come down after knee brace was removed. Pt was seen 09/19/14 and BP 180/96. CAN disposition is ED.Please advise.

## 2014-09-23 ENCOUNTER — Encounter: Payer: Self-pay | Admitting: Family Medicine

## 2014-09-23 ENCOUNTER — Ambulatory Visit (INDEPENDENT_AMBULATORY_CARE_PROVIDER_SITE_OTHER): Payer: Medicare Other | Admitting: Family Medicine

## 2014-09-23 VITALS — BP 158/96 | HR 81 | Temp 97.8°F | Wt 183.2 lb

## 2014-09-23 DIAGNOSIS — I10 Essential (primary) hypertension: Secondary | ICD-10-CM

## 2014-09-23 NOTE — Patient Instructions (Signed)
Keep taking 10mg  of lisinopril a day.  If you get lightheaded then cut back to 5mg  (1/2 tab) a day.  If still lightheaded, it's okay to skip a day totally.  Update me with your BP in about 1 week.  Take care.  Glad to see you.

## 2014-09-23 NOTE — Progress Notes (Signed)
Pre visit review using our clinic review tool, if applicable. No additional management support is needed unless otherwise documented below in the visit note.  Her BP was up, ~875-797 systolic.  Started on lisinopril yesterday.  No ADE on med.  Not lightheaded.  Facial redness went away in the meantime, was noted yesterday.  No CP, not SOB, no BLE edema now (she had some BLE edema yesterday).  Her HA resolved.    D/w pt about goals re: BP.   Meds, vitals, and allergies reviewed.   ROS: See HPI.  Otherwise, noncontributory.  GEN: nad, alert and oriented HEENT: mucous membranes moist NECK: supple w/o LA CV: rrr.  PULM: ctab, no inc wob EXT: no edema

## 2014-09-25 NOTE — Assessment & Plan Note (Signed)
She'll keep taking 10mg  of lisinopril a day. If she gets lightheaded then cut back to 5mg  (1/2 tab) a day. If still lightheaded, it's okay to skip a day totally.  She'll update me with BP in about 1 week.  She agrees with plan.

## 2014-10-03 ENCOUNTER — Encounter: Payer: Self-pay | Admitting: Family Medicine

## 2014-12-21 ENCOUNTER — Other Ambulatory Visit: Payer: Self-pay | Admitting: Family Medicine

## 2015-02-27 ENCOUNTER — Ambulatory Visit (INDEPENDENT_AMBULATORY_CARE_PROVIDER_SITE_OTHER): Payer: Medicare Other | Admitting: Podiatry

## 2015-02-27 ENCOUNTER — Encounter: Payer: Self-pay | Admitting: Podiatry

## 2015-02-27 DIAGNOSIS — E119 Type 2 diabetes mellitus without complications: Secondary | ICD-10-CM | POA: Diagnosis not present

## 2015-02-27 DIAGNOSIS — L608 Other nail disorders: Secondary | ICD-10-CM

## 2015-02-27 DIAGNOSIS — L609 Nail disorder, unspecified: Secondary | ICD-10-CM | POA: Diagnosis not present

## 2015-02-27 NOTE — Patient Instructions (Signed)
Okay to go to the pedicures to have your toenails trimmed Do not soak your feet and whirlpool Do not manipulate cuticles Return yearly for foot examination or sooner if you have a specific concern  Diabetes and Foot Care Diabetes may cause you to have problems because of poor blood supply (circulation) to your feet and legs. This may cause the skin on your feet to become thinner, break easier, and heal more slowly. Your skin may become dry, and the skin may peel and crack. You may also have nerve damage in your legs and feet causing decreased feeling in them. You may not notice minor injuries to your feet that could lead to infections or more serious problems. Taking care of your feet is one of the most important things you can do for yourself.  HOME CARE INSTRUCTIONS  Wear shoes at all times, even in the house. Do not go barefoot. Bare feet are easily injured.  Check your feet daily for blisters, cuts, and redness. If you cannot see the bottom of your feet, use a mirror or ask someone for help.  Wash your feet with warm water (do not use hot water) and mild soap. Then pat your feet and the areas between your toes until they are completely dry. Do not soak your feet as this can dry your skin.  Apply a moisturizing lotion or petroleum jelly (that does not contain alcohol and is unscented) to the skin on your feet and to dry, brittle toenails. Do not apply lotion between your toes.  Trim your toenails straight across. Do not dig under them or around the cuticle. File the edges of your nails with an emery board or nail file.  Do not cut corns or calluses or try to remove them with medicine.  Wear clean socks or stockings every day. Make sure they are not too tight. Do not wear knee-high stockings since they may decrease blood flow to your legs.  Wear shoes that fit properly and have enough cushioning. To break in new shoes, wear them for just a few hours a day. This prevents you from injuring  your feet. Always look in your shoes before you put them on to be sure there are no objects inside.  Do not cross your legs. This may decrease the blood flow to your feet.  If you find a minor scrape, cut, or break in the skin on your feet, keep it and the skin around it clean and dry. These areas may be cleansed with mild soap and water. Do not cleanse the area with peroxide, alcohol, or iodine.  When you remove an adhesive bandage, be sure not to damage the skin around it.  If you have a wound, look at it several times a day to make sure it is healing.  Do not use heating pads or hot water bottles. They may burn your skin. If you have lost feeling in your feet or legs, you may not know it is happening until it is too late.  Make sure your health care provider performs a complete foot exam at least annually or more often if you have foot problems. Report any cuts, sores, or bruises to your health care provider immediately. SEEK MEDICAL CARE IF:   You have an injury that is not healing.  You have cuts or breaks in the skin.  You have an ingrown nail.  You notice redness on your legs or feet.  You feel burning or tingling in your legs or  feet.  You have pain or cramps in your legs and feet.  Your legs or feet are numb.  Your feet always feel cold. SEEK IMMEDIATE MEDICAL CARE IF:   There is increasing redness, swelling, or pain in or around a wound.  There is a red line that goes up your leg.  Pus is coming from a wound.  You develop a fever or as directed by your health care provider.  You notice a bad smell coming from an ulcer or wound. Document Released: 10/18/2000 Document Revised: 06/23/2013 Document Reviewed: 03/30/2013 [facility] Patient Information 2015 [place], [place]. This information is not intended to replace advice given to you by your health care provider. Make sure you discuss any questions you have with your health care provider.

## 2015-02-27 NOTE — Progress Notes (Signed)
   Subjective:    Patient ID: Cathy Larsen, female    DOB: 07-05-1926, 81 y.o.   MRN: [id number]  HPI  N-SORE, DISCOLORATION L-B/L 2ND TOENAILS D-2 YEARS O-SLOWLY C-WORSE A-PRESSURE T-TRIM  ALSO, PT REQUESTING FOR TOENAILS DEBRIDEMENT. Patient is a diabetic and denies any history of foot ulceration or claudication  Patient was last seen in office in 2011 4 mycotic toenails. She also has been treated in the past for plantar fasciitis with shockwave therapy  Review of Systems  All other systems reviewed and are negative.      Objective:   Physical Exam  Orientated 3  Vascular: DP and PT pulses 2/4 bilaterally Capillary reflex immediate bilaterally No edema noted bilaterally  Neurological: Ankle reflex equal and reactive bilaterally Vibratory sensation intact bilaterally Sedation to 10 g monofilament wire intact 5/5 bilaterally  Dermatological: Texture and turgor within normal limits bilaterally The nails are incurvated with normal texture and color 6-10  Musculoskeletal: HAV right No restriction ankle, subtalar, midtarsal joints bilaterally      Assessment & Plan:   Assessment: Satisfactory neurovascular status Incurvated toenails 6-10 non-dystrophic  Plan: Review the results of examination with patient today The toenails 6-10 were debrided without any bleeding  Advised patient okay to see pedicures for nail debridement without the use of whirlpool or manipulating the cuticles  Reappoint yearly or at patient's request

## 2015-03-02 ENCOUNTER — Other Ambulatory Visit: Payer: Self-pay | Admitting: Family Medicine

## 2015-03-07 ENCOUNTER — Other Ambulatory Visit: Payer: Self-pay | Admitting: *Deleted

## 2015-03-07 MED ORDER — LISINOPRIL 10 MG PO TABS: ORAL_TABLET | ORAL | Status: DC

## 2015-03-09 ENCOUNTER — Other Ambulatory Visit: Payer: Self-pay | Admitting: *Deleted

## 2015-03-09 MED ORDER — LISINOPRIL 10 MG PO TABS: ORAL_TABLET | ORAL | Status: DC

## 2015-05-01 ENCOUNTER — Other Ambulatory Visit: Payer: Self-pay

## 2015-05-15 IMAGING — CT CT HEAD W/O CM
1 series · 16 of 30 positions shown, 20 images · non-contrast
Comparison: CT HEAD W/O CM dated 07/04/2012

CLINICAL DATA: Numbness

EXAM:
CT HEAD WITHOUT CONTRAST
TECHNIQUE: Contiguous axial images were obtained from the base of the skull
through the vertex without intravenous contrast.

[Series 2: head 5.0 h30s · axial · 0.43mm/px · z∈[-113,+22]mm · 16 of 31 slices shown, 20 images]
[im 2/31  brain]
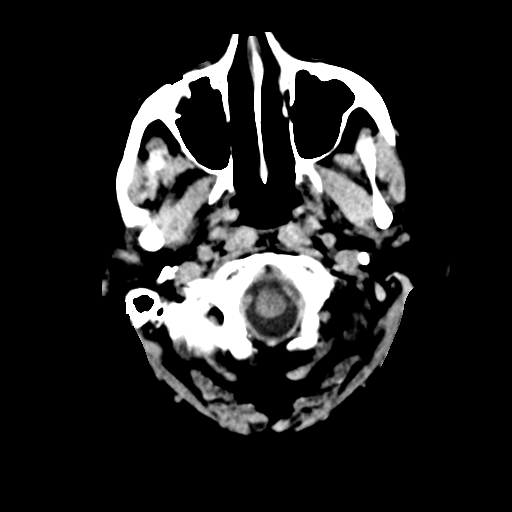
[im 2/31  bone]
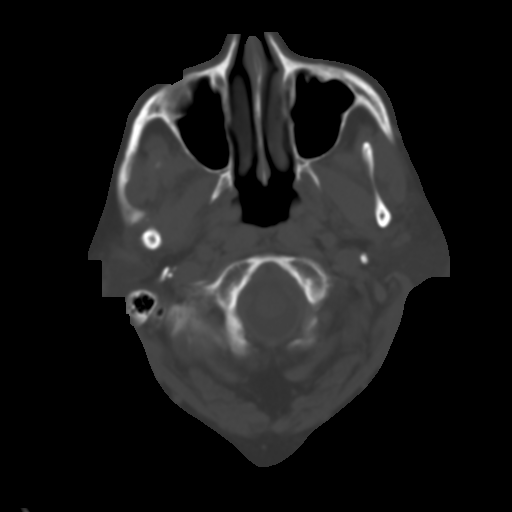
[im 4/31  brain]
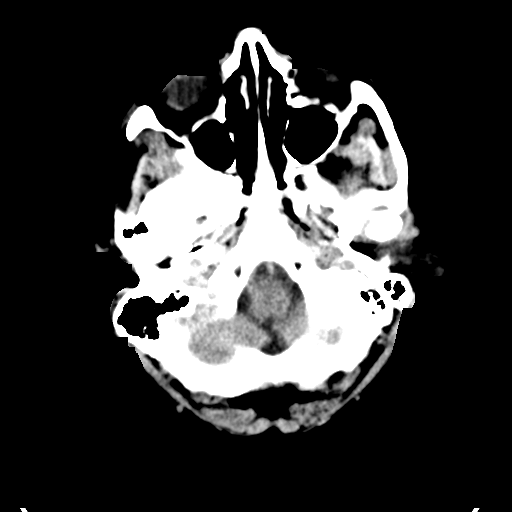
[im 6/31  brain]
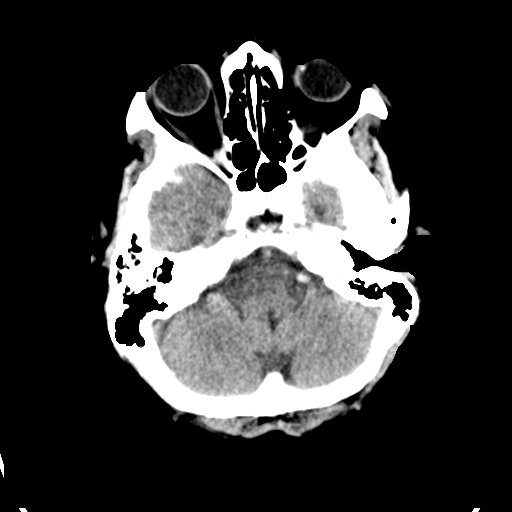
[im 8/31  brain]
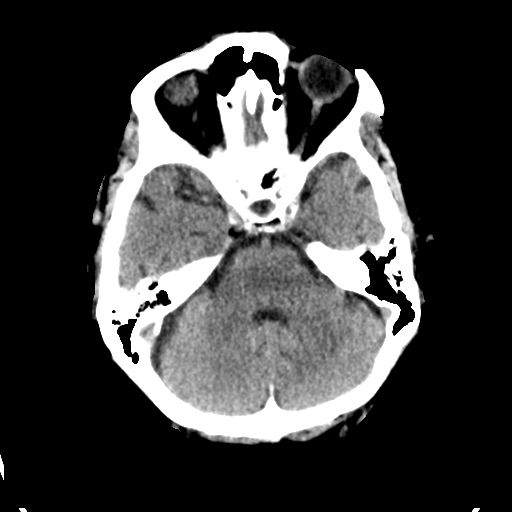
[im 9/31  brain]
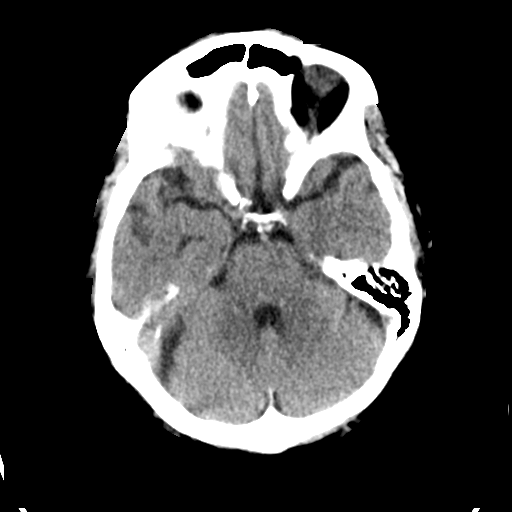
[im 9/31  bone]
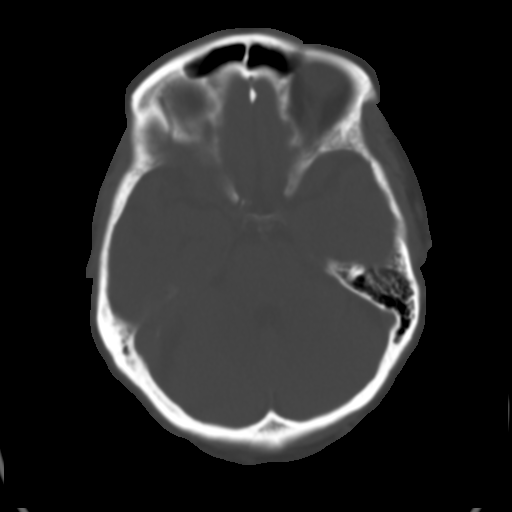
[im 11/31  brain]
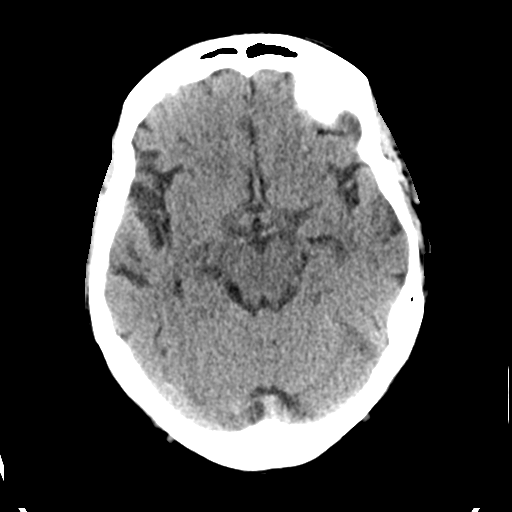
[im 13/31  brain]
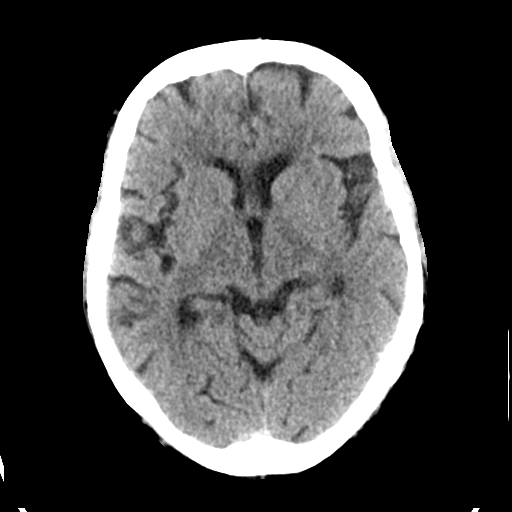
[im 15/31  brain]
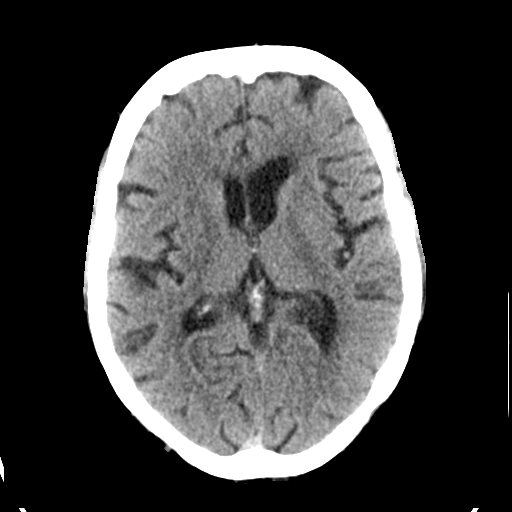
[im 16/31  brain]
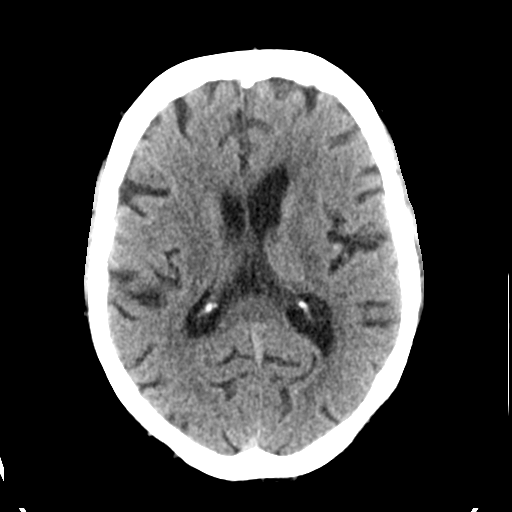
[im 16/31  bone]
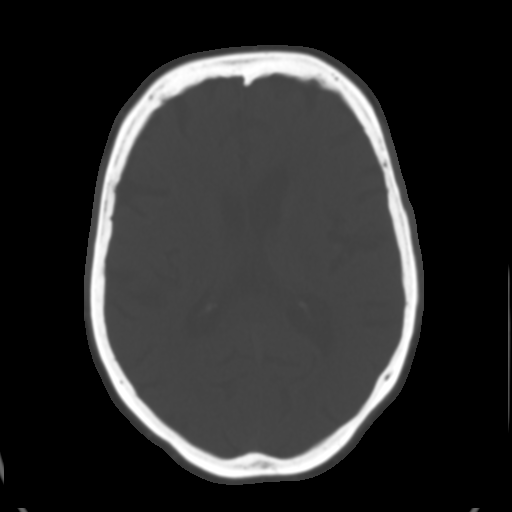
[im 18/31  brain]
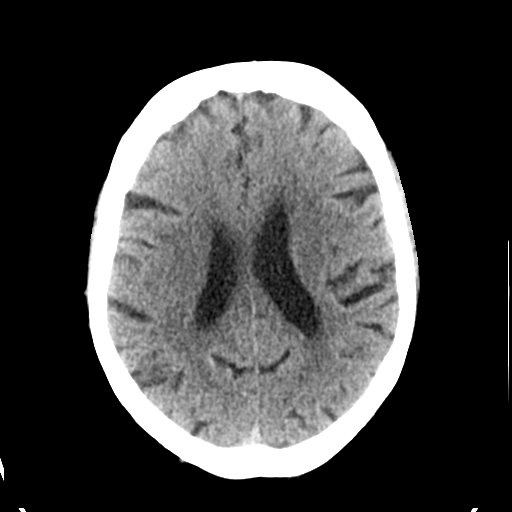
[im 20/31  brain]
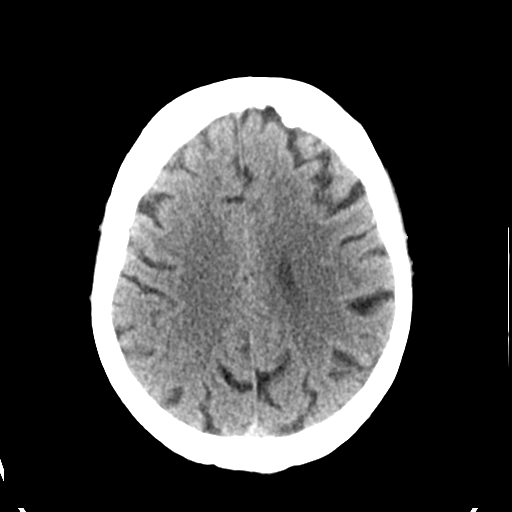
[im 22/31  brain]
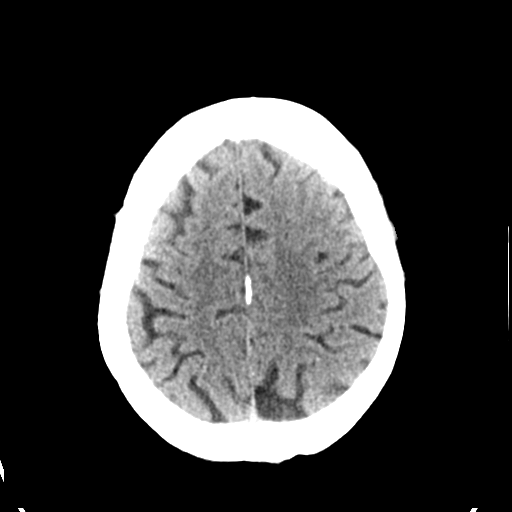
[im 23/31  brain]
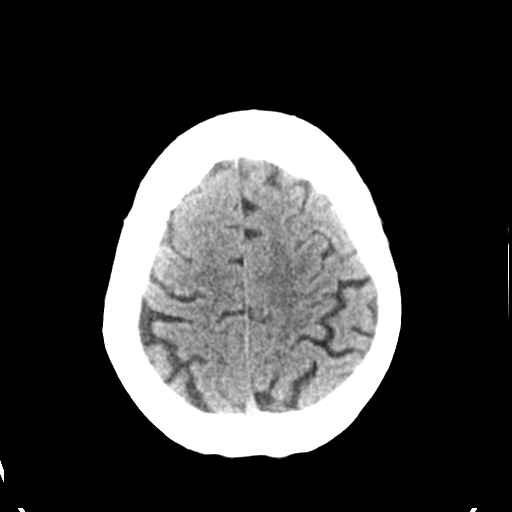
[im 23/31  bone]
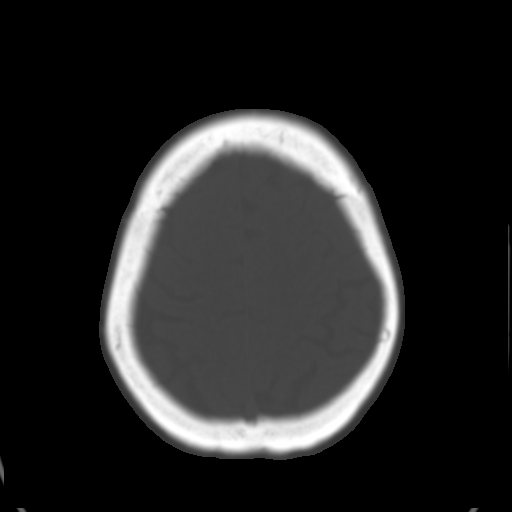
[im 25/31  brain]
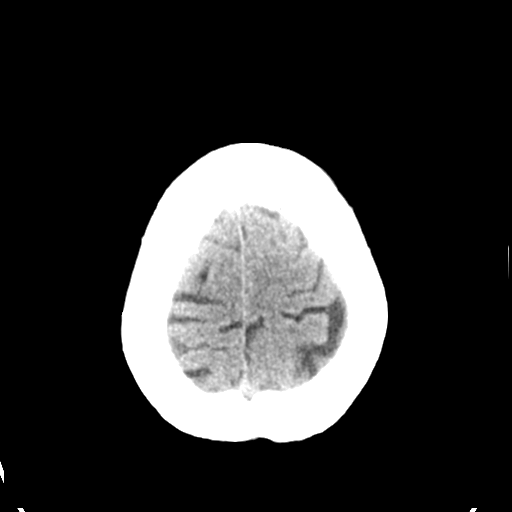
[im 27/31  brain]
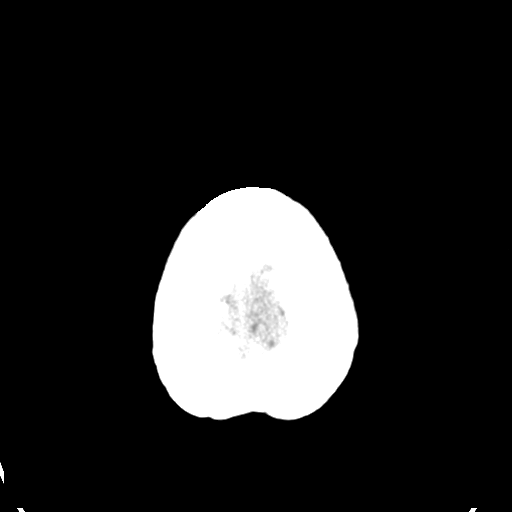
[im 29/31  brain]
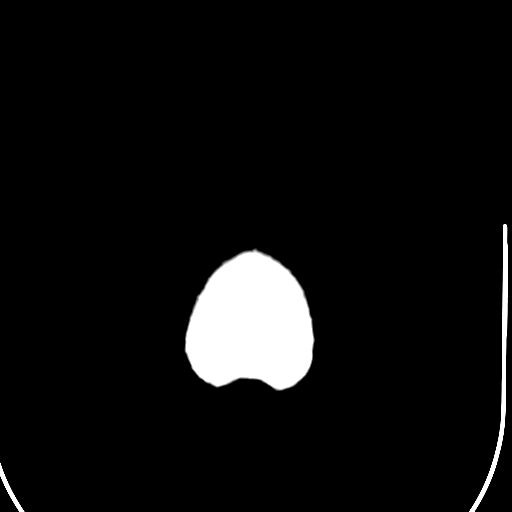

[16 of 30 positions shown; findings below may reference images not displayed]

FINDINGS: There is mild diffuse age-appropriate cerebral atrophy with mild
compensatory ventriculomegaly. There is no acute intracranial
hemorrhage nor evidence of an evolving ischemic infarction. The
cerebellum and brainstem are normal in density. There are no
abnormal intracranial calcifications.

At bone window settings the observed portions of the paranasal
sinuses and mastoid air cells are clear. There is no evidence of an
acute skull fracture.
IMPRESSION: 1. There is no evidence of an acute ischemic or hemorrhagic event
within the brain. There is no intracranial mass effect or
hydrocephalus.
2. There is age appropriate diffuse cerebral and cerebellar atrophy,
and there is stable subtle decreased density in the deep white
matter of both cerebral hemispheres consistent with chronic small
vessel ischemic type change.

## 2015-05-22 ENCOUNTER — Encounter: Payer: Self-pay | Admitting: Family Medicine

## 2015-05-24 ENCOUNTER — Ambulatory Visit (INDEPENDENT_AMBULATORY_CARE_PROVIDER_SITE_OTHER): Payer: Medicare Other | Admitting: Family Medicine

## 2015-05-24 ENCOUNTER — Encounter: Payer: Self-pay | Admitting: Family Medicine

## 2015-05-24 VITALS — BP 160/80 | HR 96 | Temp 98.1°F | Wt 184.8 lb

## 2015-05-24 DIAGNOSIS — I10 Essential (primary) hypertension: Secondary | ICD-10-CM | POA: Diagnosis not present

## 2015-05-24 DIAGNOSIS — E119 Type 2 diabetes mellitus without complications: Secondary | ICD-10-CM | POA: Diagnosis not present

## 2015-05-24 NOTE — Assessment & Plan Note (Signed)
Continue as is for now, see notes on A1c when resulted.

## 2015-05-24 NOTE — Assessment & Plan Note (Signed)
Diet was way off recently.  Restarted lower salt diet.   If BP is usually 140 (or lower)/90 (or lower), then continue as is.  . If consistently a lot higher, then she'll let me know.   Check bmet today, no change in meds today.  She agrees.

## 2015-05-24 NOTE — Patient Instructions (Signed)
If your BP is usually 140 (or lower)/90 (or lower), than that is fine.  If consistently a lot higher, then let me know.  Go to the lab on the way out.  We'll contact you with your lab report. Don't change your meds for now.   Take care.  Glad to see you.  Schedule a physical for the fall of this year in about 3 months.

## 2015-05-24 NOTE — Progress Notes (Signed)
Pre visit review using our clinic review tool, if applicable. No additional management support is needed unless otherwise documented below in the visit note.  She was having to care for her sister and then needed a lot of home repairs.  This has been difficult for her and that may have affected her BP (and diet).   No CP, SOB, BLE.  She did take one extra lisinopril prev but is back on her regular dose.   At home, her BP has been ~140/70s at home, last checked this AM.    DM2.  Due for f/u A1c.  No ADE on meds.  Sugar has been 120-140s recently.  She is working on her diet. She has rare sensations of low sugars, resolved with a snack.    Meds, vitals, and allergies reviewed.   ROS: See HPI.  Otherwise, noncontributory.  GEN: nad, alert and oriented HEENT: mucous membranes moist NECK: supple w/o LA CV: rrr. PULM: ctab, no inc wob ABD: soft, +bs EXT: no edema SKIN: no acute rash

## 2015-05-25 LAB — BASIC METABOLIC PANEL
BUN: 19 mg/dL (ref 6–23)
CHLORIDE: 101 meq/L (ref 96–112)
CO2: 30 mEq/L (ref 19–32)
Calcium: 9.7 mg/dL (ref 8.4–10.5)
Creatinine, Ser: 1.27 mg/dL — ABNORMAL HIGH (ref 0.40–1.20)
GFR: 42.32 mL/min — ABNORMAL LOW (ref >60.00)
GLUCOSE: 213 mg/dL — AB (ref 70–99)
Potassium: 4.4 mEq/L (ref 3.5–5.1)
Sodium: 139 mEq/L (ref 135–145)

## 2015-05-25 LAB — HEMOGLOBIN A1C: HEMOGLOBIN A1C: 6.7 % — AB (ref 4.6–6.5)

## 2015-06-06 ENCOUNTER — Encounter: Payer: Medicare Other | Admitting: Family Medicine

## 2015-06-14 ENCOUNTER — Other Ambulatory Visit: Payer: Self-pay | Admitting: Family Medicine

## 2015-07-06 LAB — HM DIABETES EYE EXAM

## 2015-08-18 ENCOUNTER — Other Ambulatory Visit: Payer: Self-pay | Admitting: Family Medicine

## 2015-08-18 DIAGNOSIS — E119 Type 2 diabetes mellitus without complications: Secondary | ICD-10-CM

## 2015-08-22 ENCOUNTER — Other Ambulatory Visit (INDEPENDENT_AMBULATORY_CARE_PROVIDER_SITE_OTHER): Payer: Medicare Other

## 2015-08-22 DIAGNOSIS — E119 Type 2 diabetes mellitus without complications: Secondary | ICD-10-CM | POA: Diagnosis not present

## 2015-08-22 LAB — LIPID PANEL
CHOLESTEROL: 216 mg/dL — AB (ref 0–200)
HDL: 36.7 mg/dL — ABNORMAL LOW (ref >39.00)
LDL CALC: 153 mg/dL — AB (ref 0–99)
NONHDL: 179.44
Total CHOL/HDL Ratio: 6
Triglycerides: 133 mg/dL (ref 0.0–149.0)
VLDL: 26.6 mg/dL (ref 0.0–40.0)

## 2015-08-22 LAB — COMPREHENSIVE METABOLIC PANEL
ALBUMIN: 3.9 g/dL (ref 3.5–5.2)
ALT: 12 U/L (ref 0–35)
AST: 16 U/L (ref 0–37)
Alkaline Phosphatase: 55 U/L (ref 39–117)
BUN: 21 mg/dL (ref 6–23)
CO2: 32 mEq/L (ref 19–32)
CREATININE: 1.13 mg/dL (ref 0.40–1.20)
Calcium: 9.4 mg/dL (ref 8.4–10.5)
Chloride: 105 mEq/L (ref 96–112)
GFR: 48.4 mL/min — ABNORMAL LOW (ref >60.00)
Glucose, Bld: 165 mg/dL — ABNORMAL HIGH (ref 70–99)
Potassium: 4 mEq/L (ref 3.5–5.1)
Sodium: 142 mEq/L (ref 135–145)
TOTAL PROTEIN: 6.7 g/dL (ref 6.0–8.3)
Total Bilirubin: 0.5 mg/dL (ref 0.2–1.2)

## 2015-08-22 LAB — HEMOGLOBIN A1C: HEMOGLOBIN A1C: 6.7 % — AB (ref 4.6–6.5)

## 2015-08-29 ENCOUNTER — Ambulatory Visit (INDEPENDENT_AMBULATORY_CARE_PROVIDER_SITE_OTHER): Payer: Medicare Other | Admitting: Family Medicine

## 2015-08-29 ENCOUNTER — Encounter: Payer: Self-pay | Admitting: Family Medicine

## 2015-08-29 VITALS — BP 128/78 | HR 89 | Temp 98.0°F | Ht 63.0 in | Wt 183.0 lb

## 2015-08-29 DIAGNOSIS — I10 Essential (primary) hypertension: Secondary | ICD-10-CM | POA: Diagnosis not present

## 2015-08-29 DIAGNOSIS — Z23 Encounter for immunization: Secondary | ICD-10-CM

## 2015-08-29 DIAGNOSIS — Z0000 Encounter for general adult medical examination without abnormal findings: Secondary | ICD-10-CM | POA: Diagnosis not present

## 2015-08-29 DIAGNOSIS — E785 Hyperlipidemia, unspecified: Secondary | ICD-10-CM

## 2015-08-29 DIAGNOSIS — E1142 Type 2 diabetes mellitus with diabetic polyneuropathy: Secondary | ICD-10-CM | POA: Diagnosis not present

## 2015-08-29 DIAGNOSIS — Z7189 Other specified counseling: Secondary | ICD-10-CM

## 2015-08-29 NOTE — Progress Notes (Signed)
Pre visit review using our clinic review tool, if applicable. No additional management support is needed unless otherwise documented below in the visit note.  I have personally reviewed the Medicare Annual Wellness questionnaire and have noted 1. The patient's medical and social history 2. Their use of alcohol, tobacco or illicit drugs 3. Their current medications and supplements 4. The patient's functional ability including ADL's, fall risks, home safety risks and hearing or visual             impairment. 5. Diet and physical activities 6. Evidence for depression or mood disorders  The patients weight, height, BMI have been recorded in the chart and visual acuity is per eye clinic.  I have made referrals, counseling and provided education to the patient based review of the above and I have provided the pt with a written personalized care plan for preventive services.  Provider list updated- see scanned forms.  Routine anticipatory guidance given to patient.  See health maintenance.  Flu 2016 Shingles 2009 PNA 2016 Tetanus 2009 Colonoscopy NA due to age, patient declined.   Breast cancer screening 2015, d/w pt.  Advance directive- grandson TJ designated if patient were incapacitated.  Cognitive function addressed- see scanned forms- and if abnormal then additional documentation follows.  She felt well enough to go [place].    Diabetes:  Using medications without difficulties: yes Hypoglycemic episodes:no Hyperglycemic episodes:no Feet problems: some dec in sensation noted by patient B Blood Sugars averaging: ~130.  eye exam within last year: yes, no retinopathy.  Labs d/w pt.   Hypertension:    Using medication without problems or lightheadedness: yes Chest pain with exertion:no Edema:no Short of breath:no  HLD.  Statin intolerant.  D/w pt about diet and exercise, see above re: hiking.    PMH and SH reviewed  Meds, vitals, and allergies reviewed.    ROS: See HPI.  Otherwise negative.    GEN: nad, alert and oriented HEENT: mucous membranes moist NECK: supple w/o LA CV: rrr. PULM: ctab, no inc wob ABD: soft, +bs EXT: no edema SKIN: no acute rash  Diabetic foot exam: Normal inspection No skin breakdown No calluses  Normal DP pulses Normal sensation to light touch but dec to monofilament B Nails normal

## 2015-08-29 NOTE — Patient Instructions (Signed)
Call about a mammogram.  [facility] in about 6 months.  A1c before the visit.  Don't change your meds for now.   Take care.  Glad to see you.

## 2015-08-31 DIAGNOSIS — Z7189 Other specified counseling: Secondary | ICD-10-CM | POA: Insufficient documentation

## 2015-08-31 NOTE — Assessment & Plan Note (Signed)
Statin intolerant. D/w pt.  Would continue with diet and exercise as tolerated.

## 2015-08-31 NOTE — Assessment & Plan Note (Signed)
Flu 2016 Shingles 2009 PNA 2016 Tetanus 2009 Colonoscopy NA due to age, patient declined.   Breast cancer screening 2015, d/w pt.  Advance directive- grandson TJ designated if patient were incapacitated.  Cognitive function addressed- see scanned forms- and if abnormal then additional documentation follows.  She felt well enough to go [place].

## 2015-08-31 NOTE — Assessment & Plan Note (Signed)
Controlled, d/w pt.  Would continue with diet and exercise as tolerated.  No change in meds.  Labs d/w pt.  She agrees.

## 2015-08-31 NOTE — Assessment & Plan Note (Signed)
Controlled, d/w pt.  Would continue with diet and exercise as tolerated.  No change in meds. No low sugars.  Labs d/w pt.  She agrees.

## 2015-11-02 ENCOUNTER — Encounter: Payer: Self-pay | Admitting: *Deleted

## 2016-02-20 ENCOUNTER — Other Ambulatory Visit (INDEPENDENT_AMBULATORY_CARE_PROVIDER_SITE_OTHER): Payer: Medicare Other

## 2016-02-20 DIAGNOSIS — E1142 Type 2 diabetes mellitus with diabetic polyneuropathy: Secondary | ICD-10-CM

## 2016-02-20 LAB — HEMOGLOBIN A1C: Hgb A1c MFr Bld: 6.6 % — ABNORMAL HIGH (ref 4.6–6.5)

## 2016-02-21 ENCOUNTER — Other Ambulatory Visit: Payer: Medicare Other

## 2016-02-28 ENCOUNTER — Other Ambulatory Visit: Payer: Self-pay

## 2016-02-28 ENCOUNTER — Ambulatory Visit (INDEPENDENT_AMBULATORY_CARE_PROVIDER_SITE_OTHER): Payer: Medicare Other | Admitting: Family Medicine

## 2016-02-28 ENCOUNTER — Encounter: Payer: Self-pay | Admitting: Family Medicine

## 2016-02-28 VITALS — BP 128/80 | HR 88 | Temp 98.7°F | Ht 63.0 in | Wt 180.5 lb

## 2016-02-28 DIAGNOSIS — E1142 Type 2 diabetes mellitus with diabetic polyneuropathy: Secondary | ICD-10-CM

## 2016-02-28 DIAGNOSIS — Z1231 Encounter for screening mammogram for malignant neoplasm of breast: Secondary | ICD-10-CM

## 2016-02-28 NOTE — Patient Instructions (Signed)
If you have frequent low sugars, then stop the glipizide and update me.  Otherwise recheck in about 6 months before a physical, labs ahead of time.  Take care.  Glad to see you.

## 2016-02-28 NOTE — Progress Notes (Signed)
Pre visit review using our clinic review tool, if applicable. No additional management support is needed unless otherwise documented below in the visit note.  She still has numbness in the B hands, even after R CTS surgery.  She didn't want to go through with any more surgery.  She is still functional and "able to get by."    Diabetes:  Using medications without difficulties:yes Hypoglycemic episodes: rare, doing well.   Hyperglycemic episodes:no Feet problems: no, she has some prev soreness on the toes.  She has RLS.  She got a contraption to lift the sheet up and she doesn't rub the fabric and the skin/nails are better now.   Blood Sugars averaging: usually 118-130s, based on diet variation.  "Bread is the worst thing I can eat."  She has changed to whitewheat.  "I can't eat the brown." eye exam within last year: yes A1c controlled.  D/w pt.    Mammogram pending for Friday.    Meds, vitals, and allergies reviewed.   ROS: See HPI.  Otherwise negative.    GEN: nad, alert and oriented HEENT: mucous membranes moist NECK: supple w/o LA CV: rrr. PULM: ctab, no inc wob EXT: no edema  Diabetic foot exam: Normal inspection No skin breakdown No calluses  Normal DP pulses Normal sensation to light touch and monofilament Nails normal

## 2016-02-29 NOTE — Assessment & Plan Note (Signed)
D/w pt.  If she has frequent low sugars, then stop the glipizide and update me.  Otherwise recheck in about 6 months before a physical, labs ahead of time.  Continue as is for now.  She agrees.

## 2016-03-01 ENCOUNTER — Ambulatory Visit
Admission: RE | Admit: 2016-03-01 | Discharge: 2016-03-01 | Disposition: A | Discharge status: Home / Self Care | Payer: Medicare Other | Source: Ambulatory Visit

## 2016-03-01 DIAGNOSIS — Z1231 Encounter for screening mammogram for malignant neoplasm of breast: Secondary | ICD-10-CM

## 2016-03-07 ENCOUNTER — Other Ambulatory Visit: Payer: Self-pay | Admitting: Family Medicine

## 2016-04-16 ENCOUNTER — Other Ambulatory Visit: Payer: Self-pay | Admitting: Family Medicine

## 2016-04-18 ENCOUNTER — Other Ambulatory Visit: Payer: Self-pay | Admitting: Family Medicine

## 2016-04-18 NOTE — Telephone Encounter (Signed)
Agreed, please clarify with patient.  Thanks.

## 2016-04-18 NOTE — Telephone Encounter (Signed)
Electronic refill request.   I don't see that this patient has been on this medication since 2014.  Please advise.

## 2016-04-19 ENCOUNTER — Other Ambulatory Visit: Payer: Self-pay | Admitting: *Deleted

## 2016-05-29 ENCOUNTER — Ambulatory Visit (INDEPENDENT_AMBULATORY_CARE_PROVIDER_SITE_OTHER): Payer: Medicare Other | Admitting: Podiatry

## 2016-05-29 ENCOUNTER — Encounter: Payer: Self-pay | Admitting: Podiatry

## 2016-05-29 DIAGNOSIS — L609 Nail disorder, unspecified: Secondary | ICD-10-CM | POA: Diagnosis not present

## 2016-05-29 DIAGNOSIS — E119 Type 2 diabetes mellitus without complications: Secondary | ICD-10-CM

## 2016-05-29 DIAGNOSIS — L608 Other nail disorders: Secondary | ICD-10-CM

## 2016-05-29 NOTE — Patient Instructions (Signed)
Diabetes and Foot Care Diabetes may cause you to have problems because of poor blood supply (circulation) to your feet and legs. This may cause the skin on your feet to become thinner, break easier, and heal more slowly. Your skin may become dry, and the skin may peel and crack. You may also have nerve damage in your legs and feet causing decreased feeling in them. You may not notice minor injuries to your feet that could lead to infections or more serious problems. Taking care of your feet is one of the most important things you can do for yourself.  HOME CARE INSTRUCTIONS  Wear shoes at all times, even in the house. Do not go barefoot. Bare feet are easily injured.  Check your feet daily for blisters, cuts, and redness. If you cannot see the bottom of your feet, use a mirror or ask someone for help.  Wash your feet with warm water (do not use hot water) and mild soap. Then pat your feet and the areas between your toes until they are completely dry. Do not soak your feet as this can dry your skin.  Apply a moisturizing lotion or petroleum jelly (that does not contain alcohol and is unscented) to the skin on your feet and to dry, brittle toenails. Do not apply lotion between your toes.  Trim your toenails straight across. Do not dig under them or around the cuticle. File the edges of your nails with an emery board or nail file.  Do not cut corns or calluses or try to remove them with medicine.  Wear clean socks or stockings every day. Make sure they are not too tight. Do not wear knee-high stockings since they may decrease blood flow to your legs.  Wear shoes that fit properly and have enough cushioning. To break in new shoes, wear them for just a few hours a day. This prevents you from injuring your feet. Always look in your shoes before you put them on to be sure there are no objects inside.  Do not cross your legs. This may decrease the blood flow to your feet.  If you find a minor scrape,  cut, or break in the skin on your feet, keep it and the skin around it clean and dry. These areas may be cleansed with mild soap and water. Do not cleanse the area with peroxide, alcohol, or iodine.  When you remove an adhesive bandage, be sure not to damage the skin around it.  If you have a wound, look at it several times a day to make sure it is healing.  Do not use heating pads or hot water bottles. They may burn your skin. If you have lost feeling in your feet or legs, you may not know it is happening until it is too late.  Make sure your health care provider performs a complete foot exam at least annually or more often if you have foot problems. Report any cuts, sores, or bruises to your health care provider immediately. SEEK MEDICAL CARE IF:   You have an injury that is not healing.  You have cuts or breaks in the skin.  You have an ingrown nail.  You notice redness on your legs or feet.  You feel burning or tingling in your legs or feet.  You have pain or cramps in your legs and feet.  Your legs or feet are numb.  Your feet always feel cold. SEEK IMMEDIATE MEDICAL CARE IF:   There is increasing redness,   swelling, or pain in or around a wound.  There is a red line that goes up your leg.  Pus is coming from a wound.  You develop a fever or as directed by your health care provider.  You notice a bad smell coming from an ulcer or wound.   This information is not intended to replace advice given to you by your health care provider. Make sure you discuss any questions you have with your health care provider.   Document Released: 10/18/2000 Document Revised: 06/23/2013 Document Reviewed: 03/30/2013 [employer] Interactive Patient Education 2016 [employer]  

## 2016-05-30 NOTE — Progress Notes (Signed)
Patient ID: Cathy Larsen, female   DOB: [birth year], 82 y.o.   MRN: [id number]   Subjective: This patient presents today complaining of thickened and elongated toenails which or cough walking wearing shoes and recast toenail debridement.this patient was last seen for similar service on 02/27/2015  Patient is diabetic and denies any history of foot ulceration, claudication or amputation   Objective: Vascular: DP and PT pulses 2/4 bilaterally Capillary reflex immediate bilaterally No edema noted bilaterally  Neurological: Ankle reflex equal and reactive bilaterally Vibratory sensation intact bilaterally Sedation to 10 g monofilament wire intact 5/5 bilaterally  Dermatological: Texture and turgor within normal limits bilaterally The nails are incurvated with normal texture and color 6-10  Musculoskeletal: HAV right No restriction ankle, subtalar, midtarsal joints bilaterally  Assessment: Diabetic without foot complications Satisfactory neurovascular status Incurvated toenails 6-10 non-dystrophic  Plan: Review the results of examination with patient today The toenails 6-10 were debrided without any bleeding  Advised patient okay to see pedicures for nail debridement without the use of whirlpool or manipulating the cuticles  Reappoint yearly or at patient's request

## 2016-06-29 ENCOUNTER — Other Ambulatory Visit: Payer: Self-pay | Admitting: Family Medicine

## 2016-07-23 LAB — HM DIABETES EYE EXAM

## 2016-08-08 ENCOUNTER — Encounter: Payer: Self-pay | Admitting: Family Medicine

## 2016-08-26 ENCOUNTER — Other Ambulatory Visit: Payer: Self-pay | Admitting: Family Medicine

## 2016-08-26 DIAGNOSIS — E1142 Type 2 diabetes mellitus with diabetic polyneuropathy: Secondary | ICD-10-CM

## 2016-08-29 ENCOUNTER — Ambulatory Visit (INDEPENDENT_AMBULATORY_CARE_PROVIDER_SITE_OTHER): Payer: Medicare Other

## 2016-08-29 ENCOUNTER — Other Ambulatory Visit (INDEPENDENT_AMBULATORY_CARE_PROVIDER_SITE_OTHER): Payer: Medicare Other

## 2016-08-29 VITALS — BP 124/80 | HR 102 | Temp 98.1°F | Ht 63.0 in | Wt 182.8 lb

## 2016-08-29 DIAGNOSIS — Z0000 Encounter for general adult medical examination without abnormal findings: Secondary | ICD-10-CM

## 2016-08-29 DIAGNOSIS — E1142 Type 2 diabetes mellitus with diabetic polyneuropathy: Secondary | ICD-10-CM | POA: Diagnosis not present

## 2016-08-29 DIAGNOSIS — Z23 Encounter for immunization: Secondary | ICD-10-CM

## 2016-08-29 LAB — COMPREHENSIVE METABOLIC PANEL
ALBUMIN: 4.1 g/dL (ref 3.5–5.2)
ALK PHOS: 55 U/L (ref 39–117)
ALT: 13 U/L (ref 0–35)
AST: 17 U/L (ref 0–37)
BILIRUBIN TOTAL: 0.5 mg/dL (ref 0.2–1.2)
BUN: 18 mg/dL (ref 6–23)
CHLORIDE: 105 meq/L (ref 96–112)
CO2: 31 mEq/L (ref 19–32)
CREATININE: 1.19 mg/dL (ref 0.40–1.20)
Calcium: 9.5 mg/dL (ref 8.4–10.5)
GFR: 45.49 mL/min — ABNORMAL LOW (ref >60.00)
Glucose, Bld: 164 mg/dL — ABNORMAL HIGH (ref 70–99)
Potassium: 4 mEq/L (ref 3.5–5.1)
SODIUM: 143 meq/L (ref 135–145)
TOTAL PROTEIN: 6.8 g/dL (ref 6.0–8.3)

## 2016-08-29 LAB — LIPID PANEL
CHOLESTEROL: 244 mg/dL — AB (ref 0–200)
HDL: 41.3 mg/dL (ref >39.00)
LDL Cholesterol: 168 mg/dL — ABNORMAL HIGH (ref 0–99)
NonHDL: 202.93
TRIGLYCERIDES: 175 mg/dL — AB (ref 0.0–149.0)
Total CHOL/HDL Ratio: 6
VLDL: 35 mg/dL (ref 0.0–40.0)

## 2016-08-29 LAB — HEMOGLOBIN A1C: Hgb A1c MFr Bld: 6.7 % — ABNORMAL HIGH (ref 4.6–6.5)

## 2016-08-29 NOTE — Patient Instructions (Signed)
Ms. Yamada , Thank you for taking time to come for your Medicare Wellness Visit. I appreciate your ongoing commitment to your health goals. Please review the following plan we discussed and let me know if I can assist you in the future.   These are the goals we discussed: Goals    . Increase physical activity          Starting 08/29/2016, I will continue to walk at least 30 min daily.        This is a list of the screening recommended for you and due dates:  Health Maintenance  Topic Date Due  . Complete foot exam   02/27/2017  . Hemoglobin A1C  02/27/2017  . Eye exam for diabetics  07/23/2017  . Tetanus Vaccine  06/23/2018  . Flu Shot  Completed  . DEXA scan (bone density measurement)  Completed  . Shingles Vaccine  Completed  . Pneumonia vaccines  Completed   Preventive Care for Adults  A healthy lifestyle and preventive care can promote health and wellness. Preventive health guidelines for adults include the following key practices.  . A routine yearly physical is a good way to check with your health care provider about your health and preventive screening. It is a chance to share any concerns and updates on your health and to receive a thorough exam.  . Visit your dentist for a routine exam and preventive care every 6 months. Brush your teeth twice a day and floss once a day. Good oral hygiene prevents tooth decay and gum disease.  . The frequency of eye exams is based on your age, health, family medical history, use  of contact lenses, and other factors. Follow your health care provider's ecommendations for frequency of eye exams.  . Eat a healthy diet. Foods like vegetables, fruits, whole grains, low-fat dairy products, and lean protein foods contain the nutrients you need without too many calories. Decrease your intake of foods high in solid fats, added sugars, and salt. Eat the right amount of calories for you. Get information about a proper diet from your health care  provider, if necessary.  . Regular physical exercise is one of the most important things you can do for your health. Most adults should get at least 150 minutes of moderate-intensity exercise (any activity that increases your heart rate and causes you to sweat) each week. In addition, most adults need muscle-strengthening exercises on 2 or more days a week.  Silver Sneakers may be a benefit available to you. To determine eligibility, you may visit the website: www.silversneakers.com or contact program at (575) 846-0145 Mon-Fri between 8AM-8PM.   . Maintain a healthy weight. The body mass index (BMI) is a screening tool to identify possible weight problems. It provides an estimate of body fat based on height and weight. Your health care provider can find your BMI and can help you achieve or maintain a healthy weight.   For adults 20 years and older: ? A BMI below 18.5 is considered underweight. ? A BMI of 18.5 to 24.9 is normal. ? A BMI of 25 to 29.9 is considered overweight. ? A BMI of 30 and above is considered obese.   . Maintain normal blood lipids and cholesterol levels by exercising and minimizing your intake of saturated fat. Eat a balanced diet with plenty of fruit and vegetables. Blood tests for lipids and cholesterol should begin at age 37 and be repeated every 5 years. If your lipid or cholesterol levels are high, you  are over 26, or you are at high risk for heart disease, you may need your cholesterol levels checked more frequently. Ongoing high lipid and cholesterol levels should be treated with medicines if diet and exercise are not working.  . If you smoke, find out from your health care provider how to quit. If you do not use tobacco, please do not start.  . If you choose to drink alcohol, please do not consume more than 2 drinks per day. One drink is considered to be 12 ounces (355 mL) of beer, 5 ounces (148 mL) of wine, or 1.5 ounces (44 mL) of liquor.  . If you are 79-79 years  old, ask your health care provider if you should take aspirin to prevent strokes.  . Use sunscreen. Apply sunscreen liberally and repeatedly throughout the day. You should seek shade when your shadow is shorter than you. Protect yourself by wearing long sleeves, pants, a wide-brimmed hat, and sunglasses year round, whenever you are outdoors.  . Once a month, do a whole body skin exam, using a mirror to look at the skin on your back. Tell your health care provider of new moles, moles that have irregular borders, moles that are larger than a pencil eraser, or moles that have changed in shape or color.

## 2016-08-29 NOTE — Progress Notes (Signed)
Subjective:   Cathy Larsen is a 82 y.o. female who presents for Medicare Annual (Subsequent) preventive examination.  Review of Systems:  N/A Cardiac Risk Factors include: advanced age (>51men, >41 women);diabetes mellitus;dyslipidemia;hypertension;obesity (BMI >30kg/m2)     Objective:     Vitals: BP 124/80 (BP Location: Right Arm, Patient Position: Sitting, Cuff Size: Normal)   Pulse (!) 102   Temp 98.1 F (36.7 C) (Oral)   Ht 5\' 3"  (1.6 m) Comment: no shoes  Wt 182 lb 12 oz (82.9 kg)   SpO2 97%   BMI 32.37 kg/m   Body mass index is 32.37 kg/m.   Tobacco History  Smoking Status  . Never Smoker  Smokeless Tobacco  . Never Used     Counseling given: No   Past Medical History:  Diagnosis Date  . Arthritis    feet and hands  . CAD (coronary artery disease)    cath 1998 with 40% lesion in LAD but no other disease  . Diabetes mellitus   . Gout 03/1998  . Hyperlipidemia 08/1997  . Hypertension 09/23/1995  . Skin cancer    Past Surgical History:  Procedure Laterality Date  . BIOPSY BREAST  03/2000   Ductogram right breast w/ biopsy- obstructing lesion benign by path. (Dr. Annamaria Boots)   . BLADDER SURGERY     bladder tack   . CARDIAC CATHETERIZATION  03/1997   40% occulsion lad   . CARPAL TUNNEL RELEASE Right 03/14/2014   Procedure: RIGHT CARPAL TUNNEL RELEASE;  Surgeon: Tennis Must, MD;  Location: [facility];  Service: Orthopedics;  Laterality: Right;  . CATARACT EXTRACTION    . PARTIAL HYSTERECTOMY    . VAGINAL DELIVERY     x 3  . YAG LASER APPLICATION Right [id number]   Procedure: YAG LASER APPLICATION;  Surgeon: Williams Che, MD;  Location: [facility];  Service: Ophthalmology;  Laterality: Right;   Family History  Problem Relation Age of Onset  . Cancer Mother     breast   . Kidney failure Mother   . Cancer Father     colon   . Cancer Brother     bladder   . Cancer Brother     lung  . Hypertension Sister   . Hypertension Sister   .  Diabetes Neg Hx   . Stroke Neg Hx    History  Sexual Activity  . Sexual activity: No    Outpatient Encounter Prescriptions as of 08/29/2016  Medication Sig  . BOSWELLIA SERRATA PO Take by mouth daily.  . Flaxseed, Linseed, (FLAX SEED OIL) 1000 MG CAPS Take 1 capsule by mouth daily.  . Ginger, Zingiber officinalis, (GINGER ROOT) 550 MG CAPS Take 96 mg by mouth 2 (two) times daily. (3 capsules)  . glipiZIDE (GLUCOTROL) 5 MG tablet Take one-half tablet by  mouth daily  . lisinopril (PRINIVIL,ZESTRIL) 10 MG tablet TAKE 1 TABLET (10 MG TOTAL) BY MOUTH DAILY.  . Multiple Vitamin (MULTIVITAMIN WITH MINERALS) TABS Take 1 tablet by mouth daily. ALIVE Womens' 50+  . NON FORMULARY Zyflamend Whole Body, take one by mouth daily   . Omega-3 Fatty Acids (FISH OIL) 1000 MG CAPS Take 1,000 mg by mouth daily.   . ONE TOUCH ULTRA TEST test strip USE DAILY TO CHECK SUGAR  . Turmeric 500 MG CAPS Take by mouth daily.  . [DISCONTINUED] aspirin 325 MG tablet Take 325 mg by mouth daily.     No facility-administered encounter medications on file as of  08/29/2016.     Activities of Daily Living In your present state of health, do you have any difficulty performing the following activities: 08/29/2016  Hearing? N  Vision? N  Difficulty concentrating or making decisions? N  Walking or climbing stairs? N  Dressing or bathing? N  Doing errands, shopping? N  Preparing Food and eating ? N  Using the Toilet? N  In the past six months, have you accidently leaked urine? N  Do you have problems with loss of bowel control? N  Managing your Medications? N  Managing your Finances? N  Housekeeping or managing your Housekeeping? N  Some recent data might be hidden    Patient Care Team: Tonia Ghent, MD as PCP - General (Family Medicine) Danella Sensing, MD as Consulting Physician (Dermatology) Gean Birchwood, DPM as Consulting Physician (Podiatry) Williams Che, MD as Consulting Physician  (Ophthalmology) Eula Listen, DDS as Referring Physician (Dentistry)    Assessment:     Hearing Screening   125Hz  250Hz  500Hz  1000Hz  2000Hz  3000Hz  4000Hz  6000Hz  8000Hz   Right ear:   40 40 40  40    Left ear:   40 40 40  0    Vision Screening Comments: Last vision exam with Dr. Royetta Asal on 07/17/2016   Exercise Activities and Dietary recommendations Current Exercise Habits: Home exercise routine, Type of exercise: walking, Time (Minutes): 30, Frequency (Times/Week): 7, Weekly Exercise (Minutes/Week): 210, Intensity: Mild, Exercise limited by: None identified  Goals    . Increase physical activity          Starting 08/29/2016, I will continue to walk at least 30 min daily.       Fall Risk Fall Risk  08/29/2016 01/21/2014  Falls in the past year? No No   Depression Screen PHQ 2/9 Scores 08/29/2016 01/21/2014  PHQ - 2 Score 0 0     Cognitive Function MMSE - Mini Mental State Exam 08/29/2016  Orientation to time 5  Orientation to Place 5  Registration 3  Attention/ Calculation 0  Recall 2  Recall-comments pt is unable to recall 1 of 3 words  Language- name 2 objects 0  Language- repeat 1  Language- follow 3 step command 3  Language- read & follow direction 0  Write a sentence 0  Copy design 0  Total score 19       PLEASE NOTE: A Mini-Cog screen was completed. Maximum score is 20. A value of 0 denotes this part of Folstein MMSE was not completed or the patient failed this part of the Mini-Cog screening.   Mini-Cog Screening Orientation to Time - Max 5 pts Orientation to Place - Max 5 pts Registration - Max 3 pts Recall - Max 3 pts Language Repeat - Max 1 pts Language Follow 3 Step Command - Max 3 pts   Immunization History  Administered Date(s) Administered  . H1N1 12/22/2008  . Influenza Split 07/15/2012  . Influenza Whole 10/04/2004, 08/01/2008, 09/12/2009  . Influenza,inj,Quad PF,36+ Mos 07/27/2014, 08/29/2015, 08/29/2016  . Influenza-Unspecified 08/31/2013   . Pneumococcal Conjugate-13 08/29/2015  . Pneumococcal Polysaccharide-23 08/04/1996  . Td 02/02/1998, 06/23/2008  . Zoster 11/26/2007   Screening Tests Health Maintenance  Topic Date Due  . FOOT EXAM  02/27/2017  . HEMOGLOBIN A1C  02/27/2017  . OPHTHALMOLOGY EXAM  07/23/2017  . TETANUS/TDAP  06/23/2018  . INFLUENZA VACCINE  Completed  . DEXA SCAN  Completed  . ZOSTAVAX  Completed  . PNA vac Low Risk Adult  Completed  Plan:     I have personally reviewed and addressed the Medicare Annual Wellness questionnaire and have noted the following in the patient's chart:  A. Medical and social history B. Use of alcohol, tobacco or illicit drugs  C. Current medications and supplements D. Functional ability and status E.  Nutritional status F.  Physical activity G. Advance directives H. List of other physicians I.  Hospitalizations, surgeries, and ER visits in previous 12 months J.  [place] to include hearing, vision, cognitive, depression L. Referrals and appointments - none  In addition, I have reviewed and discussed with patient certain preventive protocols, quality metrics, and best practice recommendations. A written personalized care plan for preventive services as well as general preventive health recommendations were provided to patient.  See attached scanned questionnaire for additional information.   Signed,   Lindell Noe, MHA, BS, LPN Health Coach

## 2016-08-29 NOTE — Progress Notes (Signed)
PCP notes:   Health maintenance:  A1C - completed Flu vaccine - administered  Abnormal screenings:   Hearing - failed Mini-Cog score: 19/20  Patient concerns:   Pt has concerns with numbness in both hands. Pt states this is a chronic condition and PCP is aware.   Nurse concerns:  None  Next PCP appt:   09/03/16 @ 1045  I reviewed health advisor's note, was available for consultation on the day of service listed in this note, and agree with documentation and plan. Elsie Stain, MD.

## 2016-08-29 NOTE — Progress Notes (Signed)
Pre visit review using our clinic review tool, if applicable. No additional management support is needed unless otherwise documented below in the visit note. 

## 2016-09-03 ENCOUNTER — Encounter: Payer: Self-pay | Admitting: Family Medicine

## 2016-09-03 ENCOUNTER — Ambulatory Visit (INDEPENDENT_AMBULATORY_CARE_PROVIDER_SITE_OTHER): Payer: Medicare Other | Admitting: Family Medicine

## 2016-09-03 VITALS — BP 122/80 | HR 82 | Temp 98.5°F | Ht 63.0 in | Wt 187.5 lb

## 2016-09-03 DIAGNOSIS — E119 Type 2 diabetes mellitus without complications: Secondary | ICD-10-CM | POA: Diagnosis not present

## 2016-09-03 DIAGNOSIS — I10 Essential (primary) hypertension: Secondary | ICD-10-CM | POA: Diagnosis not present

## 2016-09-03 DIAGNOSIS — E785 Hyperlipidemia, unspecified: Secondary | ICD-10-CM | POA: Diagnosis not present

## 2016-09-03 DIAGNOSIS — G2581 Restless legs syndrome: Secondary | ICD-10-CM

## 2016-09-03 DIAGNOSIS — G5603 Carpal tunnel syndrome, bilateral upper limbs: Secondary | ICD-10-CM | POA: Diagnosis not present

## 2016-09-03 DIAGNOSIS — G5600 Carpal tunnel syndrome, unspecified upper limb: Secondary | ICD-10-CM | POA: Insufficient documentation

## 2016-09-03 MED ORDER — GLIPIZIDE 5 MG PO TABS: 2.5000 mg | ORAL_TABLET | Freq: Every day | ORAL | 3 refills | Status: DC

## 2016-09-03 MED ORDER — LISINOPRIL 10 MG PO TABS: ORAL_TABLET | ORAL | 3 refills | Status: DC

## 2016-09-03 NOTE — Progress Notes (Signed)
Hearing - failed.  D/w pt about hearing aids.  Declined for now.   Mini-Cog score: 19/20.  Memory testing d/w pt.  3/3 recall today.  No red flag events.  A&Ox3.  Can do math and read a watch.  Normal testing today.  She and I don't think she has a sig memory loss.  She recalls a lot of details about recent events.    HLD.  Labs d/w pt.  Statin intolerant.   Ongoing numbness in both hands from CTS. She has some improvement after surgery on R hand.  She hasn't had L hand surgery yet.  She wanted to put it off as much as possible.  Prev taste changes resolved.    RLS.  She has a contraption to keep the sheets off her legs and feet and that helps some at night.  She is putting up with the symptoms.  She is using a cane some in the yard; no h/o falls.  This was preventive per patient report.    Diabetes:  Using medications without difficulties:yes Hypoglycemic episodes:no Hyperglycemic episodes:no Feet problems:see above re: RLS.  Blood Sugars averaging: 130-150s.   eye exam within last year:yes A1c controlled.    Hypertension:    Using medication without problems or lightheadedness: yes Chest pain with exertion:no Edema:no Short of breath:no  PMH and SH reviewed  ROS: Per HPI unless specifically indicated in ROS section   Meds, vitals, and allergies reviewed.   GEN: nad, alert and oriented HEENT: mucous membranes moist NECK: supple w/o LA CV: rrr.  no murmur PULM: ctab, no inc wob ABD: soft, +bs EXT: no edema SKIN: no acute rash  Diabetic foot exam: Normal inspection No skin breakdown No calluses  Normal DP pulses Normal sensation to light touch and monofilament Nails normal

## 2016-09-03 NOTE — Patient Instructions (Signed)
Recheck in about 6 months, labs ahead of time.  Don't change your meds for now.  Take care.  Glad to see you.

## 2016-09-03 NOTE — Progress Notes (Signed)
Pre visit review using our clinic review tool, if applicable. No additional management support is needed unless otherwise documented below in the visit note. 

## 2016-09-04 NOTE — Assessment & Plan Note (Signed)
Statin intolerant. Continue as is with work on diet.

## 2016-09-04 NOTE — Assessment & Plan Note (Signed)
Controlled. Continue as is. No change in medications.

## 2016-09-04 NOTE — Assessment & Plan Note (Signed)
At previous baseline. She is considering additional to have surgery on the left hand. She is trying to put this off as long she can.

## 2016-09-04 NOTE — Assessment & Plan Note (Signed)
Controlled. A1c discussed with patient. Continue as is. No change in medications.

## 2016-09-04 NOTE — Assessment & Plan Note (Signed)
Tolerable. Continue as is. Update me as needed. She says her symptoms are not bad enough to need change in treatment at this point.

## 2016-10-02 ENCOUNTER — Encounter: Payer: Self-pay | Admitting: Podiatry

## 2016-10-02 ENCOUNTER — Ambulatory Visit (INDEPENDENT_AMBULATORY_CARE_PROVIDER_SITE_OTHER): Payer: Medicare Other | Admitting: Podiatry

## 2016-10-02 VITALS — BP 129/76 | HR 84 | Resp 18

## 2016-10-02 DIAGNOSIS — L608 Other nail disorders: Secondary | ICD-10-CM | POA: Diagnosis not present

## 2016-10-02 DIAGNOSIS — E119 Type 2 diabetes mellitus without complications: Secondary | ICD-10-CM

## 2016-10-02 NOTE — Patient Instructions (Signed)
Remove the Band-Aid on the third left toe in 1-3 days and apply topical antibiotic ointment and a Band-Aid daily until a scab forms  Diabetes and Foot Care Diabetes may cause you to have problems because of poor blood supply (circulation) to your feet and legs. This may cause the skin on your feet to become thinner, break easier, and heal more slowly. Your skin may become dry, and the skin may peel and crack. You may also have nerve damage in your legs and feet causing decreased feeling in them. You may not notice minor injuries to your feet that could lead to infections or more serious problems. Taking care of your feet is one of the most important things you can do for yourself. Follow these instructions at home:  Wear shoes at all times, even in the house. Do not go barefoot. Bare feet are easily injured.  Check your feet daily for blisters, cuts, and redness. If you cannot see the bottom of your feet, use a mirror or ask someone for help.  Wash your feet with warm water (do not use hot water) and mild soap. Then pat your feet and the areas between your toes until they are completely dry. Do not soak your feet as this can dry your skin.  Apply a moisturizing lotion or petroleum jelly (that does not contain alcohol and is unscented) to the skin on your feet and to dry, brittle toenails. Do not apply lotion between your toes.  Trim your toenails straight across. Do not dig under them or around the cuticle. File the edges of your nails with an emery board or nail file.  Do not cut corns or calluses or try to remove them with medicine.  Wear clean socks or stockings every day. Make sure they are not too tight. Do not wear knee-high stockings since they may decrease blood flow to your legs.  Wear shoes that fit properly and have enough cushioning. To break in new shoes, wear them for just a few hours a day. This prevents you from injuring your feet. Always look in your shoes before you put them on  to be sure there are no objects inside.  Do not cross your legs. This may decrease the blood flow to your feet.  If you find a minor scrape, cut, or break in the skin on your feet, keep it and the skin around it clean and dry. These areas may be cleansed with mild soap and water. Do not cleanse the area with peroxide, alcohol, or iodine.  When you remove an adhesive bandage, be sure not to damage the skin around it.  If you have a wound, look at it several times a day to make sure it is healing.  Do not use heating pads or hot water bottles. They may burn your skin. If you have lost feeling in your feet or legs, you may not know it is happening until it is too late.  Make sure your health care provider performs a complete foot exam at least annually or more often if you have foot problems. Report any cuts, sores, or bruises to your health care provider immediately. Contact a health care provider if:  You have an injury that is not healing.  You have cuts or breaks in the skin.  You have an ingrown nail.  You notice redness on your legs or feet.  You feel burning or tingling in your legs or feet.  You have pain or cramps in your  legs and feet.  Your legs or feet are numb.  Your feet always feel cold. Get help right away if:  There is increasing redness, swelling, or pain in or around a wound.  There is a red line that goes up your leg.  Pus is coming from a wound.  You develop a fever or as directed by your health care provider.  You notice a bad smell coming from an ulcer or wound. This information is not intended to replace advice given to you by your health care provider. Make sure you discuss any questions you have with your health care provider. Document Released: 10/18/2000 Document Revised: 03/28/2016 Document Reviewed: 03/30/2013 Elsevier Interactive Patient Education  2017 [employer].

## 2016-10-03 NOTE — Progress Notes (Signed)
Patient ID: Cathy Larsen, female   DOB: Feb 11, 1926, 82 y.o.   MRN: [id number]     Subjective: This patient presents today complaining of thickened and elongated toenails which are uncomfortable when walking wearing shoes and requests toenail debridement today Patient is diabetic and denies any history of foot ulceration, claudication or amputation   Objective: Orientated 3  Vascular: DP and PT pulses 2/4 bilaterally Capillary reflex immediate bilaterally No edema noted bilaterally  Neurological: Ankle reflex equal and reactive bilaterally Vibratory sensation intact bilaterally Sedation to 10 g monofilament wire intact 5/5 bilaterally  Dermatological: Texture and turgor within normal limits bilaterally The nails are incurvated with normal texture and color 6-10  Musculoskeletal: HAV right No restriction ankle, subtalar, midtarsal joints bilaterally  Assessment: Diabetic without foot complications Satisfactory neurovascular status Incurvated toenails 6-10 non-dystrophic  Plan: The toenails 6-10 were debrided with slight bleeding distal third left toe. Antibiotic ointment and Band-Aid applied to this area. Patient instructed to remove Band-Aid 1-3 days and apply topical antibiotic ointment and Band-Aid until a scab forms. Patient would prefer to present to your office for nail debridement.  Reappoint 3 months

## 2016-10-15 LAB — HM DIABETES EYE EXAM

## 2017-01-01 ENCOUNTER — Encounter: Payer: Self-pay | Admitting: Podiatry

## 2017-01-01 ENCOUNTER — Ambulatory Visit (INDEPENDENT_AMBULATORY_CARE_PROVIDER_SITE_OTHER): Payer: Medicare Other | Admitting: Podiatry

## 2017-01-01 VITALS — BP 176/92 | HR 92 | Resp 18

## 2017-01-01 DIAGNOSIS — E119 Type 2 diabetes mellitus without complications: Secondary | ICD-10-CM | POA: Diagnosis not present

## 2017-01-01 DIAGNOSIS — L608 Other nail disorders: Secondary | ICD-10-CM | POA: Diagnosis not present

## 2017-01-01 NOTE — Progress Notes (Signed)
Patient ID: Cathy Larsen, female   DOB: 1926-02-18, 83 y.o.   MRN: [id number]    Subjective: This patient presents today complaining of thickened and elongated toenails which are uncomfortable when walking wearing shoes and requests toenail debridement today Patient is diabetic and denies any history of foot ulceration, claudication or amputation Patient complains of occasional intermittent uncomfortable feelings on off weightbearing in her right and left feet  Objective: Orientated 3  Vascular: DP and PT pulses 2/4 bilaterally Capillary reflex immediate bilaterally No edema noted bilaterally  Neurological: Ankle reflex equal and reactive bilaterally Vibratory sensation intact bilaterally Sedation to 10 g monofilament wire intact 5/5 bilaterally  Dermatological: No open skin is lesions bilaterally Atrophic skin bilaterally Texture and turgor within normal limits bilaterally The nails are incurvated with normal texture and color 6-10  Musculoskeletal: HAV right No restriction ankle, subtalar, midtarsal joints bilaterally  Assessment: Diabetic without foot complications Satisfactory neurovascular status Incurvated toenails 6-10 non-dystrophic  Plan: The toenails 6-10 were debrided mechanically and electronically without any bleeding  Reappoint 3 months

## 2017-01-01 NOTE — Patient Instructions (Signed)

## 2017-01-07 ENCOUNTER — Encounter: Payer: Self-pay | Admitting: Family Medicine

## 2017-02-17 ENCOUNTER — Other Ambulatory Visit: Payer: Self-pay | Admitting: Family Medicine

## 2017-02-17 DIAGNOSIS — E119 Type 2 diabetes mellitus without complications: Secondary | ICD-10-CM

## 2017-02-26 ENCOUNTER — Other Ambulatory Visit (INDEPENDENT_AMBULATORY_CARE_PROVIDER_SITE_OTHER): Payer: Medicare Other

## 2017-02-26 DIAGNOSIS — E119 Type 2 diabetes mellitus without complications: Secondary | ICD-10-CM | POA: Diagnosis not present

## 2017-02-26 LAB — HEMOGLOBIN A1C: Hgb A1c MFr Bld: 6.9 % — ABNORMAL HIGH (ref 4.6–6.5)

## 2017-03-03 ENCOUNTER — Ambulatory Visit: Payer: Medicare Other | Admitting: Family Medicine

## 2017-03-04 ENCOUNTER — Ambulatory Visit (INDEPENDENT_AMBULATORY_CARE_PROVIDER_SITE_OTHER): Payer: Medicare Other | Admitting: Family Medicine

## 2017-03-04 ENCOUNTER — Encounter: Payer: Self-pay | Admitting: Family Medicine

## 2017-03-04 VITALS — BP 150/80 | HR 85 | Temp 98.7°F | Wt 185.5 lb

## 2017-03-04 DIAGNOSIS — E119 Type 2 diabetes mellitus without complications: Secondary | ICD-10-CM

## 2017-03-04 DIAGNOSIS — G8929 Other chronic pain: Secondary | ICD-10-CM

## 2017-03-04 DIAGNOSIS — M25561 Pain in right knee: Secondary | ICD-10-CM

## 2017-03-04 DIAGNOSIS — R011 Cardiac murmur, unspecified: Secondary | ICD-10-CM | POA: Diagnosis not present

## 2017-03-04 NOTE — Patient Instructions (Signed)
Cathy Larsen will call about your referral. If you have any low sugars, then stop the glipizide and update me.  Recheck in about 6 months with labs prior to a physical. Take care.  Glad to see you.

## 2017-03-04 NOTE — Progress Notes (Signed)
Pre visit review using our clinic review tool, if applicable. No additional management support is needed unless otherwise documented below in the visit note. 

## 2017-03-04 NOTE — Progress Notes (Signed)
Diabetes:  Using medications without difficulties:yes Hypoglycemic episodes: rare, d/w pt.   Hyperglycemic episodes:no Feet problems:she is doing better after going to the foot clinic and is still using a contraption to keep the sheets off her feet at night, with sig relief.   Blood Sugars averaging: 130-160s eye exam within last year: yes A1c controlled.  D/w pt.    Her arthritis clearly were better on ginger/tumeric.  No ADE on med.  Still walking with a cane at baseline.    New murmur noted today.  D/w pt.  She has been slightly SOB with exertion but this isn't consistent.    Meds, vitals, and allergies reviewed.   ROS: Per HPI unless specifically indicated in ROS section   GEN: nad, alert and oriented HEENT: mucous membranes moist NECK: supple w/o LA, murmur radiates to the B carotids CV: rrr. SEM noted.  PULM: ctab, no inc wob ABD: soft, +bs EXT: no edema  Diabetic foot exam: Normal inspection No skin breakdown No calluses  Normal DP pulses Normal sensation to light touch but slightly dec sens to monofilament Nails normal

## 2017-03-05 ENCOUNTER — Ambulatory Visit (HOSPITAL_COMMUNITY): Payer: Medicare Other | Attending: Cardiology

## 2017-03-05 ENCOUNTER — Other Ambulatory Visit: Payer: Self-pay

## 2017-03-05 DIAGNOSIS — I340 Nonrheumatic mitral (valve) insufficiency: Secondary | ICD-10-CM | POA: Diagnosis not present

## 2017-03-05 DIAGNOSIS — I5030 Unspecified diastolic (congestive) heart failure: Secondary | ICD-10-CM | POA: Insufficient documentation

## 2017-03-05 DIAGNOSIS — R011 Cardiac murmur, unspecified: Secondary | ICD-10-CM | POA: Diagnosis present

## 2017-03-05 DIAGNOSIS — I351 Nonrheumatic aortic (valve) insufficiency: Secondary | ICD-10-CM | POA: Diagnosis not present

## 2017-03-05 NOTE — Assessment & Plan Note (Signed)
A1c controlled.  D/w pt.   Continue as is.  No change in meds at this point.  She agrees.   If any low sugars, then stop the glipizide and update me.  Recheck in about 6 months with labs prior to a physical.

## 2017-03-05 NOTE — Assessment & Plan Note (Signed)
New finding, needs w/u.  c/w AS on exam, check echo, still okay for outpatient f/u.

## 2017-03-05 NOTE — Assessment & Plan Note (Signed)
Walking with cane but clearly in less pain with current meds and okay for outpatient f/u. Fall cautions d/w pt.

## 2017-03-07 ENCOUNTER — Encounter: Payer: Self-pay | Admitting: *Deleted

## 2017-04-02 ENCOUNTER — Encounter: Payer: Self-pay | Admitting: Podiatry

## 2017-04-02 ENCOUNTER — Ambulatory Visit (INDEPENDENT_AMBULATORY_CARE_PROVIDER_SITE_OTHER): Payer: Medicare Other | Admitting: Podiatry

## 2017-04-02 VITALS — BP 162/82 | HR 86 | Resp 18

## 2017-04-02 DIAGNOSIS — E119 Type 2 diabetes mellitus without complications: Secondary | ICD-10-CM | POA: Diagnosis not present

## 2017-04-02 DIAGNOSIS — L608 Other nail disorders: Secondary | ICD-10-CM

## 2017-04-02 NOTE — Patient Instructions (Signed)

## 2017-04-02 NOTE — Progress Notes (Signed)
Patient ID: Cathy Larsen, female   DOB: 05/01/1926, 83 y.o.   MRN: [id number]   Subjective: This patient presents today complaining of thickened and elongated toenails which are uncomfortable when walking wearing shoesand requests toenail debridement today Patient is diabetic and denies any history of foot ulceration, claudication or amputation Patient complains of occasional intermittent uncomfortable feelings on off weightbearing in her right and left feet  Objective: Orientated 3  Vascular: DP and PT pulses 2/4 bilaterally Capillary reflex immediate bilaterally No edema noted bilaterally  Neurological: Ankle reflex equal and reactive bilaterally Vibratory sensation intact bilaterally Sedation to 10 g monofilament wire intact 5/5 bilaterally  Dermatological: No open skin is lesions bilaterally Atrophic skin and absent hair growth bilaterally Texture and turgor within normal limits bilaterally The nails are incurvated with normal texture and color 6-10  Musculoskeletal: HAV right No restriction ankle, subtalar, midtarsal joints bilaterally  Assessment: Diabetic without foot complications Satisfactory neurovascular status Incurvated toenails 6-10 non-dystrophic  Plan: The toenails 6-10 were debrided mechanically and electronically without any bleeding  Reappoint 3 months 

## 2017-04-16 ENCOUNTER — Ambulatory Visit (INDEPENDENT_AMBULATORY_CARE_PROVIDER_SITE_OTHER): Payer: Medicare Other | Admitting: Family Medicine

## 2017-04-16 ENCOUNTER — Encounter: Payer: Self-pay | Admitting: Family Medicine

## 2017-04-16 DIAGNOSIS — E119 Type 2 diabetes mellitus without complications: Secondary | ICD-10-CM | POA: Diagnosis not present

## 2017-04-16 DIAGNOSIS — J069 Acute upper respiratory infection, unspecified: Secondary | ICD-10-CM

## 2017-04-16 NOTE — Patient Instructions (Addendum)
I would stay off the glipizide for now. If you have more concerns in the meantime, then we can recheck your A1c at the end of July.  Otherwise we can recheck in the fall.  Update me as needed.   Don't skip a meal or go a long time without eating.  Your lungs sound fine.  You likely had a cold that is getting better.  Take care.  Glad to see you.

## 2017-04-16 NOTE — Progress Notes (Signed)
Cough and cold, going on for about 1-2 weeks. Some better in the meantime.  Prev with some sputum, ST.  No fevers.  No vomiting.  No diarrhea.  She feels better in the meantime.    Recent low sugars [facility] in spite of being off glipizide.  She improved with a snack.  That was after periods of relative fasting/schedule disruption.    Meds, vitals, and allergies reviewed.   ROS: Per HPI unless specifically indicated in ROS section   GEN: nad, alert and oriented HEENT: mucous membranes moist, tm w/o erythema, nasal exam w/o erythema, clear discharge noted,  OP with cobblestoning NECK: supple w/o LA CV: rrr.  SEM noted.  PULM: ctab, no inc wob EXT: no edema SKIN: no acute rash  Recheck 148/82.  She was initially rushing to get here.

## 2017-04-18 DIAGNOSIS — J069 Acute upper respiratory infection, unspecified: Secondary | ICD-10-CM | POA: Insufficient documentation

## 2017-04-18 NOTE — Assessment & Plan Note (Signed)
Nontoxic. Improved some in the meantime. Should resolve. Update [place] as needed. She agrees.

## 2017-04-18 NOTE — Assessment & Plan Note (Signed)
Discussed with patient about avoiding prolonged fasting. Would continue off medication at this point. See above. She agrees. See after visit summary.

## 2017-06-30 ENCOUNTER — Encounter: Payer: Self-pay | Admitting: Podiatry

## 2017-06-30 ENCOUNTER — Ambulatory Visit (INDEPENDENT_AMBULATORY_CARE_PROVIDER_SITE_OTHER): Payer: Medicare Other | Admitting: Podiatry

## 2017-06-30 DIAGNOSIS — L608 Other nail disorders: Secondary | ICD-10-CM | POA: Diagnosis not present

## 2017-06-30 DIAGNOSIS — E119 Type 2 diabetes mellitus without complications: Secondary | ICD-10-CM

## 2017-06-30 NOTE — Progress Notes (Signed)
Patient ID: Cathy Larsen, female   DOB: Nov 07, 1925, 83 y.o.   MRN: [id number]   Subjective: This patient presents today complaining of thickened and elongated toenails which are uncomfortable when walking wearing shoesand requests toenail debridement today Patient is diabetic and denies any history of foot ulceration, claudication or amputation Patient complains of occasional intermittent uncomfortable feelings on off weightbearing in her right and left feet  Objective: Orientated 3  Vascular: DP and PT pulses 2/4 bilaterally Capillary reflex immediate bilaterally No edema noted bilaterally  Neurological: Ankle reflex equal and reactive bilaterally Vibratory sensation intact bilaterally Sedation to 10 g monofilament wire intact 5/5 bilaterally  Dermatological: No open skin is lesions bilaterally Atrophic skin and absent hair growth bilaterally Texture and turgor within normal limits bilaterally The nails are incurvated with normal texture and color 6-10  Musculoskeletal: HAV right No restriction ankle, subtalar, midtarsal joints bilaterally  Assessment: Diabetic without foot complications Satisfactory neurovascular status Incurvated toenails 6-10 non-dystrophic  Plan: The toenails 6-10 were debrided mechanically and electronically without any bleeding  Reappoint 3 months

## 2017-06-30 NOTE — Patient Instructions (Signed)

## 2017-07-02 ENCOUNTER — Ambulatory Visit: Payer: Medicare Other | Admitting: Podiatry

## 2017-07-08 ENCOUNTER — Other Ambulatory Visit: Payer: Self-pay | Admitting: Family Medicine

## 2017-09-01 ENCOUNTER — Other Ambulatory Visit: Payer: Self-pay | Admitting: Family Medicine

## 2017-09-01 ENCOUNTER — Ambulatory Visit (INDEPENDENT_AMBULATORY_CARE_PROVIDER_SITE_OTHER): Payer: Medicare Other

## 2017-09-01 VITALS — BP 138/90 | HR 83 | Temp 97.6°F | Ht 63.0 in | Wt 183.5 lb

## 2017-09-01 DIAGNOSIS — E119 Type 2 diabetes mellitus without complications: Secondary | ICD-10-CM

## 2017-09-01 DIAGNOSIS — Z23 Encounter for immunization: Secondary | ICD-10-CM | POA: Diagnosis not present

## 2017-09-01 DIAGNOSIS — Z0000 Encounter for general adult medical examination without abnormal findings: Secondary | ICD-10-CM

## 2017-09-01 LAB — COMPREHENSIVE METABOLIC PANEL
ALBUMIN: 4 g/dL (ref 3.5–5.2)
ALK PHOS: 58 U/L (ref 39–117)
ALT: 12 U/L (ref 0–35)
AST: 15 U/L (ref 0–37)
BILIRUBIN TOTAL: 0.5 mg/dL (ref 0.2–1.2)
BUN: 12 mg/dL (ref 6–23)
CO2: 32 mEq/L (ref 19–32)
CREATININE: 1.04 mg/dL (ref 0.40–1.20)
Calcium: 9.5 mg/dL (ref 8.4–10.5)
Chloride: 104 mEq/L (ref 96–112)
GFR: 53.01 mL/min — ABNORMAL LOW (ref >60.00)
GLUCOSE: 176 mg/dL — AB (ref 70–99)
Potassium: 4.2 mEq/L (ref 3.5–5.1)
SODIUM: 141 meq/L (ref 135–145)
Total Protein: 6.7 g/dL (ref 6.0–8.3)

## 2017-09-01 LAB — LIPID PANEL
CHOLESTEROL: 228 mg/dL — AB (ref 0–200)
HDL: 35.7 mg/dL — ABNORMAL LOW (ref >39.00)
LDL Cholesterol: 156 mg/dL — ABNORMAL HIGH (ref 0–99)
NONHDL: 192.54
Total CHOL/HDL Ratio: 6
Triglycerides: 183 mg/dL — ABNORMAL HIGH (ref 0.0–149.0)
VLDL: 36.6 mg/dL (ref 0.0–40.0)

## 2017-09-01 LAB — HEMOGLOBIN A1C: HEMOGLOBIN A1C: 7.4 % — AB (ref 4.6–6.5)

## 2017-09-01 NOTE — Progress Notes (Signed)
Subjective:   Cathy Larsen is a 83 y.o. female who presents for Medicare Annual (Subsequent) preventive examination.  Review of Systems: N/A Cardiac Risk Factors include: advanced age (>44men, >45 women);diabetes mellitus;dyslipidemia;hypertension;obesity (BMI >30kg/m2)     Objective:     Vitals: BP 138/90 (BP Location: Right Arm, Patient Position: Sitting, Cuff Size: Normal)   Pulse 83   Temp 97.6 F (36.4 C) (Oral)   Ht 5\' 3"  (1.6 m) Comment: no shoes  Wt 183 lb 8 oz (83.2 kg)   SpO2 98%   BMI 32.51 kg/m   Body mass index is 32.51 kg/m.   Tobacco History  Smoking Status  . Never Smoker  Smokeless Tobacco  . Never Used     Counseling given: No   Past Medical History:  Diagnosis Date  . Arthritis    feet and hands  . CAD (coronary artery disease)    cath 1998 with 40% lesion in LAD but no other disease  . Carpal tunnel syndrome   . Diabetes mellitus   . Gout 03/1998  . Hyperlipidemia 08/1997  . Hypertension 09/23/1995  . Skin cancer    Past Surgical History:  Procedure Laterality Date  . BIOPSY BREAST  03/2000   Ductogram right breast w/ biopsy- obstructing lesion benign by path. (Dr. Annamaria Boots)   . BLADDER SURGERY     bladder tack   . CARDIAC CATHETERIZATION  03/1997   40% occulsion lad   . CARPAL TUNNEL RELEASE Right 03/14/2014   Procedure: RIGHT CARPAL TUNNEL RELEASE;  Surgeon: Tennis Must, MD;  Location: [facility];  Service: Orthopedics;  Laterality: Right;  . CATARACT EXTRACTION    . PARTIAL HYSTERECTOMY    . VAGINAL DELIVERY     x 3  . YAG LASER APPLICATION Right 11/09/1094   Procedure: YAG LASER APPLICATION;  Surgeon: Williams Che, MD;  Location: [facility];  Service: Ophthalmology;  Laterality: Right;   Family History  Problem Relation Age of Onset  . Cancer Mother        breast   . Kidney failure Mother   . Cancer Father        colon   . Cancer Brother        bladder   . Cancer Brother        lung  . Hypertension Sister     . Hypertension Sister   . Diabetes Neg Hx   . Stroke Neg Hx    History  Sexual Activity  . Sexual activity: No    Outpatient Encounter Prescriptions as of 09/01/2017  Medication Sig  . BOSWELLIA SERRATA PO Take by mouth daily.  . Flaxseed, Linseed, (FLAX SEED OIL) 1000 MG CAPS Take 1 capsule by mouth daily.  . Ginger, Zingiber officinalis, (GINGER ROOT) 550 MG CAPS Take 96 mg by mouth 2 (two) times daily. (3 capsules)  . lisinopril (PRINIVIL,ZESTRIL) 10 MG tablet TAKE 1 TABLET BY MOUTH  DAILY  . Multiple Vitamin (MULTIVITAMIN WITH MINERALS) TABS Take 1 tablet by mouth daily. ALIVE Womens' 50+  . Omega 3-6-9 Fatty Acids (OMEGA 3-6-9 COMPLEX PO) daily. Smoothie  . Omega-3 Fatty Acids (FISH OIL) 1000 MG CAPS Take 1,000 mg by mouth daily.   . ONE TOUCH ULTRA TEST test strip USE DAILY TO CHECK SUGAR  . Turmeric 500 MG CAPS Take by mouth daily.   No facility-administered encounter medications on file as of 09/01/2017.     Activities of Daily Living In your present state of health,  do you have any difficulty performing the following activities: 09/01/2017  Hearing? N  Vision? N  Difficulty concentrating or making decisions? N  Walking or climbing stairs? N  Dressing or bathing? N  Doing errands, shopping? N  Preparing Food and eating ? N  Using the Toilet? N  In the past six months, have you accidently leaked urine? N  Do you have problems with loss of bowel control? N  Managing your Medications? N  Managing your Finances? N  Housekeeping or managing your Housekeeping? N  Some recent data might be hidden    Patient Care Team: Tonia Ghent, MD as PCP - General (Family Medicine) Danella Sensing, MD as Consulting Physician (Dermatology) Gean Birchwood, DPM as Consulting Physician (Podiatry) Eula Listen, DDS as Referring Physician (Dentistry) Thelma Comp, OD as Referring Physician (Optometry)    Assessment:     Hearing Screening   125Hz  250Hz  500Hz  1000Hz   2000Hz  3000Hz  4000Hz  6000Hz  8000Hz   Right ear:   40 40 40  40    Left ear:   40 40 40  0    Vision Screening Comments: Last vision exam in Dec 2017   Exercise Activities and Dietary recommendations Current Exercise Habits: Home exercise routine, Type of exercise: walking, Time (Minutes): 30, Frequency (Times/Week): 7, Weekly Exercise (Minutes/Week): 210, Intensity: Mild, Exercise limited by: None identified  Goals    . Increase physical activity          Starting 09/01/2017, I will continue to walk at least 30 min daily.       Fall Risk Fall Risk  09/01/2017 09/03/2016 08/29/2016 01/21/2014  Falls in the past year? No No No No   Depression Screen PHQ 2/9 Scores 09/01/2017 09/03/2016 08/29/2016 01/21/2014  PHQ - 2 Score 0 0 0 0  PHQ- 9 Score 0 - - -     Cognitive Function MMSE - Mini Mental State Exam 09/01/2017 08/29/2016  Orientation to time 5 5  Orientation to Place 5 5  Registration 3 3  Attention/ Calculation 0 0  Recall 3 2  Recall-comments - pt is unable to recall 1 of 3 words  Language- name 2 objects 0 0  Language- repeat 1 1  Language- follow 3 step command 3 3  Language- read & follow direction 0 0  Write a sentence 0 0  Copy design 0 0  Total score 20 19       PLEASE NOTE: A Mini-Cog screen was completed. Maximum score is 20. A value of 0 denotes this part of Folstein MMSE was not completed or the patient failed this part of the Mini-Cog screening.   Mini-Cog Screening Orientation to Time - Max 5 pts Orientation to Place - Max 5 pts Registration - Max 3 pts Recall - Max 3 pts Language Repeat - Max 1 pts Language Follow 3 Step Command - Max 3 pts   Immunization History  Administered Date(s) Administered  . H1N1 12/22/2008  . Influenza Split 07/15/2012  . Influenza Whole 10/04/2004, 08/01/2008, 09/12/2009  . Influenza,inj,Quad PF,6+ Mos 07/27/2014, 08/29/2015, 08/29/2016, 09/01/2017  . Influenza-Unspecified 08/31/2013  . Pneumococcal Conjugate-13  08/29/2015  . Pneumococcal Polysaccharide-23 08/04/1996  . Td 02/02/1998, 06/23/2008  . Zoster 11/26/2007   Screening Tests Health Maintenance  Topic Date Due  . OPHTHALMOLOGY EXAM  10/15/2017  . HEMOGLOBIN A1C  03/02/2018  . FOOT EXAM  03/04/2018  . TETANUS/TDAP  06/23/2018  . INFLUENZA VACCINE  Completed  . DEXA SCAN  Completed  . PNA vac  Low Risk Adult  Completed      Plan:     I have personally reviewed, addressed, and noted the following in the patient's chart:  A. Medical and social history B. Use of alcohol, tobacco or illicit drugs  C. Current medications and supplements D. Functional ability and status E.  Nutritional status F.  Physical activity G. Advance directives H. List of other physicians I.  Hospitalizations, surgeries, and ER visits in previous 12 months J.  [place] to include hearing, vision, cognitive, depression L. Referrals and appointments - none  In addition, I have reviewed and discussed with patient certain preventive protocols, quality metrics, and best practice recommendations. A written personalized care plan for preventive services as well as general preventive health recommendations were provided to patient.  See attached scanned questionnaire for additional information.   Signed,   Lindell Noe, MHA, BS, LPN Health Coach

## 2017-09-01 NOTE — Progress Notes (Signed)
PCP notes:   Health maintenance:  Flu vaccine - administered  Abnormal screenings:   Hearing - failed  Hearing Screening   125Hz  250Hz  500Hz  1000Hz  2000Hz  3000Hz  4000Hz  6000Hz  8000Hz   Right ear:   40 40 40  40    Left ear:   40 40 40  0     Patient concerns:    Intermittent swelling in left ankle - elevation of feet seems to relieve swelling per pt  Nurse concerns:  None  Next PCP appt:   09/04/17 @ 1415  I reviewed health advisor's note, was available for consultation on the day of service listed in this note, and agree with documentation and plan. Elsie Stain, MD.

## 2017-09-01 NOTE — Patient Instructions (Signed)
Cathy Larsen , Thank you for taking time to come for your Medicare Wellness Visit. I appreciate your ongoing commitment to your health goals. Please review the following plan we discussed and let me know if I can assist you in the future.   These are the goals we discussed: Goals    . Increase physical activity          Starting 09/01/2017, I will continue to walk at least 30 min daily.        This is a list of the screening recommended for you and due dates:  Health Maintenance  Topic Date Due  . Eye exam for diabetics  10/15/2017  . Hemoglobin A1C  03/02/2018  . Complete foot exam   03/04/2018  . Tetanus Vaccine  06/23/2018  . Flu Shot  Completed  . DEXA scan (bone density measurement)  Completed  . Pneumonia vaccines  Completed   Preventive Care for Adults  A healthy lifestyle and preventive care can promote health and wellness. Preventive health guidelines for adults include the following key practices.  . A routine yearly physical is a good way to check with your health care provider about your health and preventive screening. It is a chance to share any concerns and updates on your health and to receive a thorough exam.  . Visit your dentist for a routine exam and preventive care every 6 months. Brush your teeth twice a day and floss once a day. Good oral hygiene prevents tooth decay and gum disease.  . The frequency of eye exams is based on your age, health, family medical history, use  of contact lenses, and other factors. Follow your health care provider's recommendations for frequency of eye exams.  . Eat a healthy diet. Foods like vegetables, fruits, whole grains, low-fat dairy products, and lean protein foods contain the nutrients you need without too many calories. Decrease your intake of foods high in solid fats, added sugars, and salt. Eat the right amount of calories for you. Get information about a proper diet from your health care provider, if necessary.  . Regular  physical exercise is one of the most important things you can do for your health. Most adults should get at least 150 minutes of moderate-intensity exercise (any activity that increases your heart rate and causes you to sweat) each week. In addition, most adults need muscle-strengthening exercises on 2 or more days a week.  Silver Sneakers may be a benefit available to you. To determine eligibility, you may visit the website: www.silversneakers.com or contact program at 660-763-1732 Mon-Fri between 8AM-8PM.   . Maintain a healthy weight. The body mass index (BMI) is a screening tool to identify possible weight problems. It provides an estimate of body fat based on height and weight. Your health care provider can find your BMI and can help you achieve or maintain a healthy weight.   For adults 20 years and older: ? A BMI below 18.5 is considered underweight. ? A BMI of 18.5 to 24.9 is normal. ? A BMI of 25 to 29.9 is considered overweight. ? A BMI of 30 and above is considered obese.   . Maintain normal blood lipids and cholesterol levels by exercising and minimizing your intake of saturated fat. Eat a balanced diet with plenty of fruit and vegetables. Blood tests for lipids and cholesterol should begin at age 98 and be repeated every 5 years. If your lipid or cholesterol levels are high, you are over 50, or you are  at high risk for heart disease, you may need your cholesterol levels checked more frequently. Ongoing high lipid and cholesterol levels should be treated with medicines if diet and exercise are not working.  . If you smoke, find out from your health care provider how to quit. If you do not use tobacco, please do not start.  . If you choose to drink alcohol, please do not consume more than 2 drinks per day. One drink is considered to be 12 ounces (355 mL) of beer, 5 ounces (148 mL) of wine, or 1.5 ounces (44 mL) of liquor.  . If you are 79-44 years old, ask your health care provider if  you should take aspirin to prevent strokes.  . Use sunscreen. Apply sunscreen liberally and repeatedly throughout the day. You should seek shade when your shadow is shorter than you. Protect yourself by wearing long sleeves, pants, a wide-brimmed hat, and sunglasses year round, whenever you are outdoors.  . Once a month, do a whole body skin exam, using a mirror to look at the skin on your back. Tell your health care provider of new moles, moles that have irregular borders, moles that are larger than a pencil eraser, or moles that have changed in shape or color.

## 2017-09-01 NOTE — Progress Notes (Signed)
Pre visit review using our clinic review tool, if applicable. No additional management support is needed unless otherwise documented below in the visit note. 

## 2017-09-04 ENCOUNTER — Ambulatory Visit (INDEPENDENT_AMBULATORY_CARE_PROVIDER_SITE_OTHER): Payer: Medicare Other | Admitting: Family Medicine

## 2017-09-04 ENCOUNTER — Encounter: Payer: Self-pay | Admitting: Family Medicine

## 2017-09-04 VITALS — BP 138/90 | HR 83 | Temp 97.6°F | Ht 63.0 in | Wt 183.5 lb

## 2017-09-04 DIAGNOSIS — R21 Rash and other nonspecific skin eruption: Secondary | ICD-10-CM | POA: Diagnosis not present

## 2017-09-04 DIAGNOSIS — I10 Essential (primary) hypertension: Secondary | ICD-10-CM | POA: Diagnosis not present

## 2017-09-04 DIAGNOSIS — G2581 Restless legs syndrome: Secondary | ICD-10-CM

## 2017-09-04 DIAGNOSIS — M109 Gout, unspecified: Secondary | ICD-10-CM

## 2017-09-04 DIAGNOSIS — Z659 Problem related to unspecified psychosocial circumstances: Secondary | ICD-10-CM

## 2017-09-04 DIAGNOSIS — Z0000 Encounter for general adult medical examination without abnormal findings: Secondary | ICD-10-CM

## 2017-09-04 DIAGNOSIS — Z7189 Other specified counseling: Secondary | ICD-10-CM

## 2017-09-04 DIAGNOSIS — E1140 Type 2 diabetes mellitus with diabetic neuropathy, unspecified: Secondary | ICD-10-CM

## 2017-09-04 NOTE — Patient Instructions (Addendum)
You can call for a mammogram at the [facility] [place] with your insurance to see if they will cover the shingrix shot. Try OTC hydrocortisone for the itchy rash and see if that helps.   Recheck A1c in about 6 months prior to a visit.  Take care.  Glad to see you.

## 2017-09-04 NOTE — Progress Notes (Signed)
Health maintenance: Flu vaccine - administered Shingles vaccine d/w pt.  See AVS.   Hearing - failed. Declined hearing aids.   D/w pt.   Colonoscopy not due, given her age.   Mammogram d/w pt.  See AVS.  DXA normal 2015.   Advance directive- grandson TJ designated if patient were incapacitated   Intermittent swelling in left ankle - elevation of feet seems to relieve swelling per pt, always gets better overnight.  Noted more if she is more active, over the last 6 months.  No swelling in the R ankle.   Diabetes:  No meds.  Hypoglycemic episodes:no Hyperglycemic episodes:no Feet problems:some mild intermittent tingling but not consistent.  This is stable per patient, not getting worse.   Blood Sugars averaging: usually ~150 eye exam within last year:yes, due ~10/2017.  She has f/u pending.   Hypertension:    Using medication without problems or lightheadedness: yes Chest pain with exertion:no Edema:see above.   Short of breath:no Labs d/w pt.   Her joint pain is clearly better with the current meds/supplements.  No ADE on med.  She is still walking with a cane, just in case.  No falls.   RLS sx controlled with contraption to keep the sheets and blankets off her feet.  She is sleeping well with that.   No recent gout flares.    Her son died and I offered my condolences.  Discussed.    PMH and SH reviewed  Meds, vitals, and allergies reviewed.   ROS: Per HPI unless specifically indicated in ROS section   GEN: nad, alert and oriented HEENT: mucous membranes moist NECK: supple w/o LA CV: rrr. Murmur noted PULM: ctab, no inc wob ABD: soft, +bs EXT: no edema on R ankle, trace on L SKIN: no acute rash except for mild eczema rash on the upper chest and chin.    Diabetic foot exam: Normal inspection No skin breakdown No calluses  Normal DP pulses Normal sensation to light touch and but dec sens to monofilament Nails normal

## 2017-09-07 DIAGNOSIS — Z0000 Encounter for general adult medical examination without abnormal findings: Secondary | ICD-10-CM | POA: Insufficient documentation

## 2017-09-07 DIAGNOSIS — R21 Rash and other nonspecific skin eruption: Secondary | ICD-10-CM | POA: Insufficient documentation

## 2017-09-07 DIAGNOSIS — Z659 Problem related to unspecified psychosocial circumstances: Secondary | ICD-10-CM | POA: Insufficient documentation

## 2017-09-07 NOTE — Assessment & Plan Note (Signed)
Reasonable control, continue as is.  Labs d/w pt.  She agrees.  She'll update me if swelling is worse.

## 2017-09-07 NOTE — Assessment & Plan Note (Signed)
No recent sx, continue as is.  She agrees.

## 2017-09-07 NOTE — Assessment & Plan Note (Signed)
Flu vaccine - administered Shingles vaccine d/w pt.  See AVS.   Hearing - failed. Declined hearing aids.   D/w pt.   Colonoscopy not due, given her age.   Mammogram d/w pt.  See AVS.  DXA normal 2015.   Advance directive- grandson TJ designated if patient were incapacitated

## 2017-09-07 NOTE — Assessment & Plan Note (Signed)
Advance directive- grandson TJ designated if patient were incapacitated

## 2017-09-07 NOTE — Assessment & Plan Note (Signed)
Her son died and I offered my condolences.  Discussed.   She is trying to adjust to the changes in her life.  Her grandson is supportive.

## 2017-09-07 NOTE — Assessment & Plan Note (Signed)
Okay to try OTC hydrocortisone and update me as needed.

## 2017-09-07 NOTE — Assessment & Plan Note (Signed)
RLS sx controlled with contraption to keep the sheets and blankets off her feet.  She is sleeping well with that.

## 2017-09-07 NOTE — Assessment & Plan Note (Signed)
Reasonable A1c, especially given the upheaval around the time of her son's death.  No change in meds.  She agrees.  Recheck periodically.

## 2017-09-29 ENCOUNTER — Ambulatory Visit (INDEPENDENT_AMBULATORY_CARE_PROVIDER_SITE_OTHER): Payer: Medicare Other | Admitting: Podiatry

## 2017-09-29 ENCOUNTER — Encounter: Payer: Self-pay | Admitting: Podiatry

## 2017-09-29 DIAGNOSIS — L608 Other nail disorders: Secondary | ICD-10-CM

## 2017-09-29 DIAGNOSIS — E119 Type 2 diabetes mellitus without complications: Secondary | ICD-10-CM

## 2017-09-29 NOTE — Patient Instructions (Signed)

## 2017-09-29 NOTE — Progress Notes (Signed)
Patient ID: Cathy Larsen, female   DOB: 1926-01-18, 83 y.o.   MRN: [id number]    Subjective: This patient presents today complaining of thickened and elongated toenails which are uncomfortable when walking wearing shoesand requests toenail debridement today Patient is diabetic and denies any history of foot ulceration, claudication or amputation Patient complains of occasional intermittent uncomfortable feelings on off weightbearing in her right and left feet  Objective: Orientated 3  Vascular: DP and PT pulses 2/4 bilaterally Capillary reflex immediate bilaterally No edema noted bilaterally  Neurological: Ankle reflex equal and reactive bilaterally Vibratory sensation intact bilaterally Sedation to 10 g monofilament wire intact 5/5 bilaterally  Dermatological: No open skin is lesions bilaterally Atrophic skin and absent hair growthbilaterally Texture and turgor within normal limits bilaterally The nails are incurvated with normal texture and color 6-10  Musculoskeletal: HAV right No restriction ankle, subtalar, midtarsal joints bilaterally  Assessment: Diabetic without foot complications Satisfactory neurovascular status Incurvated toenails 6-10 non-dystrophic  Plan: The toenails 6-10 were debrided mechanically and electronically without any bleeding  Reappoint 3 months

## 2017-11-05 DIAGNOSIS — R69 Illness, unspecified: Secondary | ICD-10-CM | POA: Diagnosis not present

## 2017-11-25 ENCOUNTER — Other Ambulatory Visit: Payer: Self-pay | Admitting: *Deleted

## 2017-11-25 MED ORDER — LISINOPRIL 10 MG PO TABS: 10.0000 mg | ORAL_TABLET | Freq: Every day | ORAL | 1 refills | Status: DC

## 2017-11-28 ENCOUNTER — Other Ambulatory Visit: Payer: Self-pay | Admitting: *Deleted

## 2017-12-26 ENCOUNTER — Ambulatory Visit: Payer: Medicare HMO | Admitting: Podiatry

## 2017-12-29 ENCOUNTER — Ambulatory Visit: Payer: Medicare HMO | Admitting: Podiatry

## 2017-12-29 ENCOUNTER — Ambulatory Visit: Payer: Medicare Other | Admitting: Podiatry

## 2017-12-29 ENCOUNTER — Encounter: Payer: Self-pay | Admitting: Podiatry

## 2017-12-29 DIAGNOSIS — E119 Type 2 diabetes mellitus without complications: Secondary | ICD-10-CM

## 2017-12-29 DIAGNOSIS — L608 Other nail disorders: Secondary | ICD-10-CM | POA: Diagnosis not present

## 2017-12-29 NOTE — Progress Notes (Signed)
Complaint:  Visit Type: Patient returns to my office for continued preventative foot care services. Complaint: Patient states" my nails have grown long and thick and become painful to walk and wear shoes" Patient has been diagnosed with DM with no foot complications. The patient presents for preventative foot care services. No changes to ROS  Podiatric Exam: Vascular: dorsalis pedis and posterior tibial pulses are palpable bilateral. Capillary return is immediate. Temperature gradient is WNL. Skin turgor WNL  Sensorium: Normal Semmes Weinstein monofilament test. Normal tactile sensation bilaterally. Nail Exam: Pt has elongated ingrown toenails  . Ulcer Exam: There is no evidence of ulcer or pre-ulcerative changes or infection. Orthopedic Exam: Muscle tone and strength are WNL. No limitations in general ROM. No crepitus or effusions noted. Foot type and digits show no abnormalities. HAV 1st MPJ  Right foot. Skin: No Porokeratosis. No infection or ulcers  Diagnosis:  Onychomycosis, , Pain in right toe, pain in left toes  Treatment & Plan Procedures and Treatment: Consent by patient was obtained for treatment procedures.   Debridement of mycotic and hypertrophic toenails, 1 through 5 bilateral and clearing of subungual debris. No ulceration, no infection noted.  Return Visit-Office Procedure: Patient instructed to return to the office for a follow up visit 3 months for continued evaluation and treatment.    Cordell Guercio DPM 

## 2018-03-03 ENCOUNTER — Other Ambulatory Visit (INDEPENDENT_AMBULATORY_CARE_PROVIDER_SITE_OTHER): Payer: Medicare HMO

## 2018-03-03 DIAGNOSIS — E1140 Type 2 diabetes mellitus with diabetic neuropathy, unspecified: Secondary | ICD-10-CM

## 2018-03-03 LAB — HEMOGLOBIN A1C: Hgb A1c MFr Bld: 7.5 % — ABNORMAL HIGH (ref 4.6–6.5)

## 2018-03-06 ENCOUNTER — Ambulatory Visit (INDEPENDENT_AMBULATORY_CARE_PROVIDER_SITE_OTHER): Payer: Medicare HMO | Admitting: Family Medicine

## 2018-03-06 ENCOUNTER — Encounter: Payer: Self-pay | Admitting: Family Medicine

## 2018-03-06 DIAGNOSIS — E119 Type 2 diabetes mellitus without complications: Secondary | ICD-10-CM

## 2018-03-06 NOTE — Progress Notes (Signed)
Diabetes:  No meds Hypoglycemic episodes:no Hyperglycemic episodes:no Feet problems: some numbness at baseline.  D/w pt about foot care.   Blood Sugars averaging: 140-150s eye exam within last year: yes, done 2019 per patient report.   A1c stable, dw pt.  At goal A1c.    Meds, vitals, and allergies reviewed.   ROS: Per HPI unless specifically indicated in ROS section   GEN: nad, alert and oriented HEENT: mucous membranes moist NECK: supple w/o LA CV: rrr. PULM: ctab, no inc wob ABD: soft, +bs EXT: trace BLE edema SKIN: no acute rash

## 2018-03-06 NOTE — Patient Instructions (Signed)
Technically you are diabetic but you don't need medicine for it.  Continue as is with your diet.  Update me as needed.  Recheck in about 6 months with yearly visit/physical.   Take care.  Glad to see you.

## 2018-03-08 NOTE — Assessment & Plan Note (Signed)
A1c stable, dw pt.  At goal A1c.   Technically she is diabetic but she doesn't need medicine for it now.  Continue as is with diet.  Update me as needed.  Recheck in about 6 months with yearly visit/physical.   She agrees.

## 2018-04-02 ENCOUNTER — Encounter: Payer: Self-pay | Admitting: Podiatry

## 2018-04-02 ENCOUNTER — Ambulatory Visit: Payer: Medicare HMO | Admitting: Podiatry

## 2018-04-02 DIAGNOSIS — B351 Tinea unguium: Secondary | ICD-10-CM | POA: Diagnosis not present

## 2018-04-02 DIAGNOSIS — M79674 Pain in right toe(s): Secondary | ICD-10-CM

## 2018-04-02 DIAGNOSIS — M79675 Pain in left toe(s): Secondary | ICD-10-CM

## 2018-04-02 DIAGNOSIS — E119 Type 2 diabetes mellitus without complications: Secondary | ICD-10-CM | POA: Diagnosis not present

## 2018-04-02 DIAGNOSIS — L608 Other nail disorders: Secondary | ICD-10-CM

## 2018-04-02 NOTE — Progress Notes (Signed)
Complaint:  Visit Type: Patient returns to my office for continued preventative foot care services. Complaint: Patient states" my nails have grown long and thick and become painful to walk and wear shoes" Patient has been diagnosed with DM with no foot complications. The patient presents for preventative foot care services. No changes to ROS  Podiatric Exam: Vascular: dorsalis pedis and posterior tibial pulses are palpable bilateral. Capillary return is immediate. Temperature gradient is WNL. Skin turgor WNL  Sensorium: Normal Semmes Weinstein monofilament test. Normal tactile sensation bilaterally. Nail Exam: Pt has elongated ingrown toenails  . Ulcer Exam: There is no evidence of ulcer or pre-ulcerative changes or infection. Orthopedic Exam: Muscle tone and strength are WNL. No limitations in general ROM. No crepitus or effusions noted. Foot type and digits show no abnormalities. HAV 1st MPJ  Right foot. Skin: No Porokeratosis. No infection or ulcers  Diagnosis:  Onychomycosis, , Pain in right toe, pain in left toes  Treatment & Plan Procedures and Treatment: Consent by patient was obtained for treatment procedures.   Debridement of mycotic and hypertrophic toenails, 1 through 5 bilateral and clearing of subungual debris. No ulceration, no infection noted.  Return Visit-Office Procedure: Patient instructed to return to the office for a follow up visit 3 months for continued evaluation and treatment.    Lanorris Kalisz DPM 

## 2018-04-20 DIAGNOSIS — D485 Neoplasm of uncertain behavior of skin: Secondary | ICD-10-CM | POA: Diagnosis not present

## 2018-04-20 DIAGNOSIS — L570 Actinic keratosis: Secondary | ICD-10-CM | POA: Diagnosis not present

## 2018-04-20 DIAGNOSIS — L821 Other seborrheic keratosis: Secondary | ICD-10-CM | POA: Diagnosis not present

## 2018-04-20 DIAGNOSIS — Z85828 Personal history of other malignant neoplasm of skin: Secondary | ICD-10-CM | POA: Diagnosis not present

## 2018-04-20 DIAGNOSIS — D0462 Carcinoma in situ of skin of left upper limb, including shoulder: Secondary | ICD-10-CM | POA: Diagnosis not present

## 2018-06-15 ENCOUNTER — Other Ambulatory Visit: Payer: Self-pay | Admitting: Family Medicine

## 2018-07-02 ENCOUNTER — Encounter: Payer: Self-pay | Admitting: Podiatry

## 2018-07-02 ENCOUNTER — Ambulatory Visit: Payer: Medicare HMO | Admitting: Podiatry

## 2018-07-02 DIAGNOSIS — M79674 Pain in right toe(s): Secondary | ICD-10-CM | POA: Diagnosis not present

## 2018-07-02 DIAGNOSIS — B351 Tinea unguium: Secondary | ICD-10-CM | POA: Diagnosis not present

## 2018-07-02 DIAGNOSIS — E119 Type 2 diabetes mellitus without complications: Secondary | ICD-10-CM | POA: Diagnosis not present

## 2018-07-02 DIAGNOSIS — L608 Other nail disorders: Secondary | ICD-10-CM

## 2018-07-02 DIAGNOSIS — M79675 Pain in left toe(s): Secondary | ICD-10-CM | POA: Diagnosis not present

## 2018-07-02 NOTE — Progress Notes (Signed)
Complaint:  Visit Type: Patient returns to my office for continued preventative foot care services. Complaint: Patient states" my nails have grown long and thick and become painful to walk and wear shoes" Patient has been diagnosed with DM with no foot complications. The patient presents for preventative foot care services. No changes to ROS  Podiatric Exam: Vascular: dorsalis pedis and posterior tibial pulses are palpable bilateral. Capillary return is immediate. Temperature gradient is WNL. Skin turgor WNL  Sensorium: Normal Semmes Weinstein monofilament test. Normal tactile sensation bilaterally. Nail Exam: Pt has elongated ingrown toenails  . Ulcer Exam: There is no evidence of ulcer or pre-ulcerative changes or infection. Orthopedic Exam: Muscle tone and strength are WNL. No limitations in general ROM. No crepitus or effusions noted. Foot type and digits show no abnormalities. HAV 1st MPJ  Right foot. Skin: No Porokeratosis. No infection or ulcers  Diagnosis:  Onychomycosis, , Pain in right toe, pain in left toes  Treatment & Plan Procedures and Treatment: Consent by patient was obtained for treatment procedures.   Debridement of mycotic and hypertrophic toenails, 1 through 5 bilateral and clearing of subungual debris. No ulceration, no infection noted.  Return Visit-Office Procedure: Patient instructed to return to the office for a follow up visit 3 months for continued evaluation and treatment.    Gardiner Barefoot DPM

## 2018-09-03 ENCOUNTER — Other Ambulatory Visit: Payer: Self-pay | Admitting: Family Medicine

## 2018-09-03 DIAGNOSIS — E119 Type 2 diabetes mellitus without complications: Secondary | ICD-10-CM

## 2018-09-04 ENCOUNTER — Ambulatory Visit (INDEPENDENT_AMBULATORY_CARE_PROVIDER_SITE_OTHER): Payer: Medicare HMO

## 2018-09-04 ENCOUNTER — Ambulatory Visit: Payer: Medicare HMO

## 2018-09-04 VITALS — BP 124/86 | HR 80 | Temp 98.3°F | Ht 63.75 in | Wt 187.5 lb

## 2018-09-04 DIAGNOSIS — Z23 Encounter for immunization: Secondary | ICD-10-CM

## 2018-09-04 DIAGNOSIS — E119 Type 2 diabetes mellitus without complications: Secondary | ICD-10-CM | POA: Diagnosis not present

## 2018-09-04 DIAGNOSIS — Z0000 Encounter for general adult medical examination without abnormal findings: Secondary | ICD-10-CM

## 2018-09-04 LAB — COMPREHENSIVE METABOLIC PANEL
ALT: 11 U/L (ref 0–35)
AST: 16 U/L (ref 0–37)
Albumin: 4.2 g/dL (ref 3.5–5.2)
Alkaline Phosphatase: 59 U/L (ref 39–117)
BUN: 17 mg/dL (ref 6–23)
CHLORIDE: 104 meq/L (ref 96–112)
CO2: 32 mEq/L (ref 19–32)
CREATININE: 1.17 mg/dL (ref 0.40–1.20)
Calcium: 9.8 mg/dL (ref 8.4–10.5)
GFR: 46.17 mL/min — ABNORMAL LOW (ref >60.00)
GLUCOSE: 166 mg/dL — AB (ref 70–99)
POTASSIUM: 4.2 meq/L (ref 3.5–5.1)
SODIUM: 142 meq/L (ref 135–145)
Total Bilirubin: 0.5 mg/dL (ref 0.2–1.2)
Total Protein: 6.9 g/dL (ref 6.0–8.3)

## 2018-09-04 LAB — CBC WITH DIFFERENTIAL/PLATELET
BASOS ABS: 0 10*3/uL (ref 0.0–0.1)
Basophils Relative: 0.4 % (ref 0.0–3.0)
EOS PCT: 3.1 % (ref 0.0–5.0)
Eosinophils Absolute: 0.3 10*3/uL (ref 0.0–0.7)
HCT: 40.9 % (ref 36.0–46.0)
Hemoglobin: 13.7 g/dL (ref 12.0–15.0)
LYMPHS PCT: 24.1 % (ref 12.0–46.0)
Lymphs Abs: 2.1 10*3/uL (ref 0.7–4.0)
MCHC: 33.5 g/dL (ref 30.0–36.0)
MCV: 87.4 fl (ref 78.0–100.0)
MONOS PCT: 10.2 % (ref 3.0–12.0)
Monocytes Absolute: 0.9 10*3/uL (ref 0.1–1.0)
NEUTROS PCT: 62.2 % (ref 43.0–77.0)
Neutro Abs: 5.3 10*3/uL (ref 1.4–7.7)
Platelets: 343 10*3/uL (ref 150.0–400.0)
RBC: 4.68 Mil/uL (ref 3.87–5.11)
RDW: 13.5 % (ref 11.5–15.5)
WBC: 8.6 10*3/uL (ref 4.0–10.5)

## 2018-09-04 LAB — LIPID PANEL
CHOLESTEROL: 221 mg/dL — AB (ref 0–200)
HDL: 37.2 mg/dL — ABNORMAL LOW (ref >39.00)
LDL CALC: 148 mg/dL — AB (ref 0–99)
NONHDL: 183.69
Total CHOL/HDL Ratio: 6
Triglycerides: 179 mg/dL — ABNORMAL HIGH (ref 0.0–149.0)
VLDL: 35.8 mg/dL (ref 0.0–40.0)

## 2018-09-04 LAB — URIC ACID: URIC ACID, SERUM: 5.7 mg/dL (ref 2.4–7.0)

## 2018-09-04 LAB — HEMOGLOBIN A1C: HEMOGLOBIN A1C: 7.6 % — AB (ref 4.6–6.5)

## 2018-09-04 NOTE — Patient Instructions (Signed)
Cathy Larsen , Thank you for taking time to come for your Medicare Wellness Visit. I appreciate your ongoing commitment to your health goals. Please review the following plan we discussed and let me know if I can assist you in the future.   These are the goals we discussed: Goals    . Increase physical activity     Starting 09/04/2018, I will continue to walk at least 30 minutes daily.        This is a list of the screening recommended for you and due dates:  Health Maintenance  Topic Date Due  . Complete foot exam   09/11/2018*  . Tetanus Vaccine  11/04/2019*  . Eye exam for diabetics  11/04/2018  . Hemoglobin A1C  03/05/2019  . Flu Shot  Completed  . DEXA scan (bone density measurement)  Completed  . Pneumonia vaccines  Completed  *Topic was postponed. The date shown is not the original due date.   Preventive Care for Adults  A healthy lifestyle and preventive care can promote health and wellness. Preventive health guidelines for adults include the following key practices.  . A routine yearly physical is a good way to check with your health care provider about your health and preventive screening. It is a chance to share any concerns and updates on your health and to receive a thorough exam.  . Visit your dentist for a routine exam and preventive care every 6 months. Brush your teeth twice a day and floss once a day. Good oral hygiene prevents tooth decay and gum disease.  . The frequency of eye exams is based on your age, health, family medical history, use  of contact lenses, and other factors. Follow your health care provider's recommendations for frequency of eye exams.  . Eat a healthy diet. Foods like vegetables, fruits, whole grains, low-fat dairy products, and lean protein foods contain the nutrients you need without too many calories. Decrease your intake of foods high in solid fats, added sugars, and salt. Eat the right amount of calories for you. Get information about a  proper diet from your health care provider, if necessary.  . Regular physical exercise is one of the most important things you can do for your health. Most adults should get at least 150 minutes of moderate-intensity exercise (any activity that increases your heart rate and causes you to sweat) each week. In addition, most adults need muscle-strengthening exercises on 2 or more days a week.  Silver Sneakers may be a benefit available to you. To determine eligibility, you may visit the website: www.silversneakers.com or contact program at 231-751-5334 Mon-Fri between 8AM-8PM.   . Maintain a healthy weight. The body mass index (BMI) is a screening tool to identify possible weight problems. It provides an estimate of body fat based on height and weight. Your health care provider can find your BMI and can help you achieve or maintain a healthy weight.   For adults 20 years and older: ? A BMI below 18.5 is considered underweight. ? A BMI of 18.5 to 24.9 is normal. ? A BMI of 25 to 29.9 is considered overweight. ? A BMI of 30 and above is considered obese.   . Maintain normal blood lipids and cholesterol levels by exercising and minimizing your intake of saturated fat. Eat a balanced diet with plenty of fruit and vegetables. Blood tests for lipids and cholesterol should begin at age 81 and be repeated every 5 years. If your lipid or cholesterol levels are  high, you are over 50, or you are at high risk for heart disease, you may need your cholesterol levels checked more frequently. Ongoing high lipid and cholesterol levels should be treated with medicines if diet and exercise are not working.  . If you smoke, find out from your health care provider how to quit. If you do not use tobacco, please do not start.  . If you choose to drink alcohol, please do not consume more than 2 drinks per day. One drink is considered to be 12 ounces (355 mL) of beer, 5 ounces (148 mL) of wine, or 1.5 ounces (44 mL) of  liquor.  . If you are 70-72 years old, ask your health care provider if you should take aspirin to prevent strokes.  . Use sunscreen. Apply sunscreen liberally and repeatedly throughout the day. You should seek shade when your shadow is shorter than you. Protect yourself by wearing long sleeves, pants, a wide-brimmed hat, and sunglasses year round, whenever you are outdoors.  . Once a month, do a whole body skin exam, using a mirror to look at the skin on your back. Tell your health care provider of new moles, moles that have irregular borders, moles that are larger than a pencil eraser, or moles that have changed in shape or color.

## 2018-09-04 NOTE — Progress Notes (Signed)
PCP notes:   Health maintenance:  Foot exam - PCP follow-up needed Tetanus vaccine - postponed/insurance A1C - completed Flu vaccine - administered  Abnormal screenings:   Hearing - failed  Hearing Screening   125Hz  250Hz  500Hz  1000Hz  2000Hz  3000Hz  4000Hz  6000Hz  8000Hz   Right ear:   40 40 40  40    Left ear:   40 40 40  0     Patient concerns:   None  Nurse concerns:  None  Next PCP appt:   09/11/18 @ 1515  I reviewed health advisor's note, was available for consultation on the day of service listed in this note, and agree with documentation and plan. Elsie Stain, MD.

## 2018-09-04 NOTE — Progress Notes (Signed)
Subjective:   Cathy Larsen is a 84 y.o. female who presents for Medicare Annual (Subsequent) preventive examination.  Review of Systems:  N/A Cardiac Risk Factors include: advanced age (>1men, >75 women);diabetes mellitus;dyslipidemia;hypertension;obesity (BMI >30kg/m2)     Objective:     Vitals: BP 124/86 (BP Location: Right Arm, Patient Position: Sitting, Cuff Size: Normal)   Pulse 80   Temp 98.3 F (36.8 C) (Oral)   Ht 5' 3.75" (1.619 m) Comment: shoes  Wt 187 lb 8 oz (85 kg)   SpO2 95%   BMI 32.44 kg/m   Body mass index is 32.44 kg/m.  Advanced Directives 09/04/2018 09/01/2017 08/29/2016 03/10/2014  Does Patient Have a Medical Advance Directive? Yes Yes Yes Patient has advance directive, copy not in chart  Type of Advance Directive [place];Living will [place];Living will [place];Living will -  Does patient want to make changes to medical advance directive? - - No - Patient declined -  Copy of [place] in Chart? No - copy requested Yes Yes -    Tobacco Social History   Tobacco Use  Smoking Status Never Smoker  Smokeless Tobacco Never Used     Counseling given: No   Clinical Intake:  Pre-visit preparation completed: Yes  Pain : No/denies pain Pain Score: 0-No pain     Nutritional Status: BMI > 30  Obese Nutritional Risks: None Diabetes: Yes CBG done?: No Did pt. bring in CBG monitor from home?: No  How often do you need to have someone help you when you read instructions, pamphlets, or other written materials from your doctor or pharmacy?: 1 - Never What is the last grade level you completed in school?: 9th grade  Interpreter Needed?: No  Comments: pt and grandson live together Information entered by :: LPinson, LPN  Past Medical History:  Diagnosis Date  . Arthritis    feet and hands  . CAD (coronary artery disease)    cath 1998 with 40% lesion in LAD but no  other disease  . Carpal tunnel syndrome   . Diabetes mellitus   . Gout 03/1998  . Hyperlipidemia 08/1997  . Hypertension 09/23/1995  . Skin cancer    Past Surgical History:  Procedure Laterality Date  . BIOPSY BREAST  03/2000   Ductogram right breast w/ biopsy- obstructing lesion benign by path. (Dr. Annamaria Boots)   . BLADDER SURGERY     bladder tack   . CARDIAC CATHETERIZATION  03/1997   40% occulsion lad   . CARPAL TUNNEL RELEASE Right 03/14/2014   Procedure: RIGHT CARPAL TUNNEL RELEASE;  Surgeon: Tennis Must, MD;  Location: [facility];  Service: Orthopedics;  Laterality: Right;  . CATARACT EXTRACTION    . PARTIAL HYSTERECTOMY    . VAGINAL DELIVERY     x 3  . YAG LASER APPLICATION Right 05/12/2955   Procedure: YAG LASER APPLICATION;  Surgeon: Williams Che, MD;  Location: [facility];  Service: Ophthalmology;  Laterality: Right;   Family History  Problem Relation Age of Onset  . Cancer Mother        breast   . Kidney failure Mother   . Cancer Father        colon   . Cancer Brother        bladder   . Cancer Brother        lung  . Hypertension Sister   . Hypertension Sister   . Diabetes Neg Hx   .  Stroke Neg Hx    Social History   Socioeconomic History  . Marital status: Married    Spouse name: Not on file  . Number of children: 3  . Years of education: Not on file  . Highest education level: Not on file  Occupational History  . 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 Financial resource strain: Not on file  . Food insecurity:    Worry: Not on file    Inability: Not on file  . Transportation needs:    Medical: Not on file    Non-medical: Not on file  Tobacco Use  . Smoking status: Never Smoker  . Smokeless tobacco: Never Used  Substance and Sexual Activity  . Alcohol use: No    Alcohol/week: 0.0 standard drinks  . Drug use: No  . Sexual activity: Not Currently  Lifestyle  . Physical activity:    Days per week: Not on file    Minutes per session: Not  on file  . Stress: Not on file  Relationships  . Social connections:    Talks on phone: Not on file    Gets together: Not on file    Attends religious service: Not on file    Active member of club or organization: Not on file    Attends meetings of clubs or organizations: Not on file    Relationship status: Not on file  Other Topics Concern  . Not on file  Social History Narrative   Widowed 10/2012, husband had CVA and dementia   Grandson lives with patient as of 2018    Outpatient Encounter Medications as of 09/04/2018  Medication Sig  . BOSWELLIA SERRATA PO Take by mouth daily.  . Flaxseed, Linseed, (FLAX SEED OIL) 1000 MG CAPS Take 1 capsule by mouth daily.  . Ginger, Zingiber officinalis, (GINGER ROOT) 550 MG CAPS Take 96 mg by mouth 2 (two) times daily. (3 capsules)  . lisinopril (PRINIVIL,ZESTRIL) 10 MG tablet TAKE 1 TABLET DAILY  . Multiple Vitamin (MULTIVITAMIN WITH MINERALS) TABS Take 1 tablet by mouth daily. ALIVE Womens' 50+  . Omega 3-6-9 Fatty Acids (OMEGA 3-6-9 COMPLEX PO) daily. Smoothie  . Omega-3 Fatty Acids (FISH OIL) 1000 MG CAPS Take 1,000 mg by mouth daily.   . ONE TOUCH ULTRA TEST test strip USE DAILY TO CHECK SUGAR  . Turmeric 500 MG CAPS Take by mouth daily.   No facility-administered encounter medications on file as of 09/04/2018.     Activities of Daily Living In your present state of health, do you have any difficulty performing the following activities: 09/04/2018  Hearing? N  Vision? N  Difficulty concentrating or making decisions? N  Walking or climbing stairs? N  Dressing or bathing? N  Doing errands, shopping? N  Preparing Food and eating ? N  Using the Toilet? N  In the past six months, have you accidently leaked urine? N  Do you have problems with loss of bowel control? N  Managing your Medications? N  Managing your Finances? N  Housekeeping or managing your Housekeeping? N  Some recent data might be hidden    Patient Care  Team: Tonia Ghent, MD as PCP - General (Family Medicine) Danella Sensing, MD as Consulting Physician (Dermatology) Gean Birchwood, DPM as Consulting Physician (Podiatry) Eula Listen, DDS as Referring Physician (Dentistry) Thelma Comp, [place] as Referring Physician (Optometry)    Assessment:   This is a routine wellness examination for Cathy Larsen.   Hearing Screening   125Hz  250Hz   500Hz  1000Hz  2000Hz  3000Hz  4000Hz  6000Hz  8000Hz   Right ear:   40 40 40  40    Left ear:   40 40 40  0    Vision Screening Comments: Vision exam in Jan 2019 with Dr. Thelma Comp   Exercise Activities and Dietary recommendations Current Exercise Habits: Home exercise routine, Type of exercise: walking, Time (Minutes): 30, Frequency (Times/Week): 7, Weekly Exercise (Minutes/Week): 210, Intensity: Mild, Exercise limited by: None identified  Goals    . Increase physical activity     Starting 09/04/2018, I will continue to walk at least 30 minutes daily.        Fall Risk Fall Risk  09/04/2018 09/01/2017 09/03/2016 08/29/2016 01/21/2014  Falls in the past year? 0 No No No No  Number falls in past yr: 0 - - - -  Injury with Fall? 0 - - - -   Depression Screen PHQ 2/9 Scores 09/04/2018 09/01/2017 09/03/2016 08/29/2016  PHQ - 2 Score 0 0 0 0  PHQ- 9 Score 0 0 - -     Cognitive Function MMSE - Mini Mental State Exam 09/04/2018 09/01/2017 08/29/2016  Orientation to time 5 5 5   Orientation to Place 5 5 5   Registration 3 3 3   Attention/ Calculation 0 0 0  Recall 3 3 2   Recall-comments - - pt is unable to recall 1 of 3 words  Language- name 2 objects 0 0 0  Language- repeat 1 1 1   Language- follow 3 step command 3 3 3   Language- read & follow direction 0 0 0  Write a sentence 0 0 0  Copy design 0 0 0  Total score 20 20 19      PLEASE NOTE: A Mini-Cog screen was completed. Maximum score is 20. A value of 0 denotes this part of [place] MMSE was not completed or the patient failed this part of  the Mini-Cog screening.   Mini-Cog Screening Orientation to Time - Max 5 pts Orientation to Place - Max 5 pts Registration - Max 3 pts Recall - Max 3 pts Language Repeat - Max 1 pts Language Follow 3 Step Command - Max 3 pts     Immunization History  Administered Date(s) Administered  . H1N1 12/22/2008  . Influenza Split 07/15/2012  . Influenza Whole 10/04/2004, 08/01/2008, 09/12/2009  . Influenza,inj,Quad PF,6+ Mos 07/27/2014, 08/29/2015, 08/29/2016, 09/01/2017, 09/04/2018  . Influenza-Unspecified 08/31/2013  . Pneumococcal Conjugate-13 08/29/2015  . Pneumococcal Polysaccharide-23 08/04/1996  . Td 02/02/1998, 06/23/2008  . Zoster 11/26/2007    Screening Tests Health Maintenance  Topic Date Due  . FOOT EXAM  09/11/2018 (Originally 09/04/2018)  . TETANUS/TDAP  11/04/2019 (Originally 06/23/2018)  . OPHTHALMOLOGY EXAM  11/04/2018  . HEMOGLOBIN A1C  03/05/2019  . INFLUENZA VACCINE  Completed  . DEXA SCAN  Completed  . PNA vac Low Risk Adult  Completed      Plan:     I have personally reviewed, addressed, and noted the following in the patient's chart:  A. Medical and social history B. Use of alcohol, tobacco or illicit drugs  C. Current medications and supplements D. Functional ability and status E.  Nutritional status F.  Physical activity G. Advance directives H. List of other physicians I.  Hospitalizations, surgeries, and ER visits in previous 12 months J.  [place] to include hearing, vision, cognitive, depression L. Referrals and appointments - none  In addition, I have reviewed and discussed with patient certain preventive protocols, quality metrics, and best practice recommendations. A  written personalized care plan for preventive services as well as general preventive health recommendations were provided to patient.  See attached scanned questionnaire for additional information.   Signed,   Lindell Noe, MHA, BS, LPN Health Coach

## 2018-09-09 ENCOUNTER — Encounter: Payer: Medicare HMO | Admitting: Family Medicine

## 2018-09-11 ENCOUNTER — Encounter: Payer: Self-pay | Admitting: Family Medicine

## 2018-09-11 ENCOUNTER — Ambulatory Visit (INDEPENDENT_AMBULATORY_CARE_PROVIDER_SITE_OTHER): Payer: Medicare HMO | Admitting: Family Medicine

## 2018-09-11 VITALS — BP 124/86 | HR 80 | Temp 98.3°F | Ht 63.75 in | Wt 187.5 lb

## 2018-09-11 DIAGNOSIS — M25561 Pain in right knee: Secondary | ICD-10-CM | POA: Diagnosis not present

## 2018-09-11 DIAGNOSIS — G8929 Other chronic pain: Secondary | ICD-10-CM | POA: Diagnosis not present

## 2018-09-11 DIAGNOSIS — E119 Type 2 diabetes mellitus without complications: Secondary | ICD-10-CM | POA: Diagnosis not present

## 2018-09-11 DIAGNOSIS — E785 Hyperlipidemia, unspecified: Secondary | ICD-10-CM

## 2018-09-11 DIAGNOSIS — Z0000 Encounter for general adult medical examination without abnormal findings: Secondary | ICD-10-CM

## 2018-09-11 DIAGNOSIS — I10 Essential (primary) hypertension: Secondary | ICD-10-CM | POA: Diagnosis not present

## 2018-09-11 DIAGNOSIS — Z7189 Other specified counseling: Secondary | ICD-10-CM

## 2018-09-11 MED ORDER — LISINOPRIL 10 MG PO TABS: 10.0000 mg | ORAL_TABLET | Freq: Every day | ORAL | 3 refills | Status: DC

## 2018-09-11 NOTE — Patient Instructions (Addendum)
Keep doing what you are doing.  Thanks for your effort.  Consider getting a fall button.  Keep using your cane.  Recheck in about 6 months.  The only lab you need to have done for your next diabetic visit is an A1c.  We can do this with a fingerstick test at the office visit.  You do not need a lab visit ahead of time for this.  It does not matter if you are fasting when the lab is done.    Take care.  Glad to see you.

## 2018-09-11 NOTE — Progress Notes (Signed)
Diabetes:  No meds.   Hypoglycemic episodes: no Hyperglycemic episodes:no Feet problems: minimal occ tingling in the feet.   Blood Sugars averaging: ~140 usually eye exam within last year: yes Statin intolerant.    Hypertension:    Using medication without problems or lightheadedness: yes Chest pain with exertion:no Edema: she has some occ mild L ankle edema at the end of the day Short of breath:no  She is not having knee pain when taking turmeric.  Pain returned when off med.  No adverse effect on medication.  Discussed.  Fall cautions d/w pt.  She is using a cane. No falls.    Flu vaccine prev done.  Shingles vaccine out of stock.   Hearing- failed. Declined hearing aids.   D/w pt.   Colonoscopy not due, given her age.   Mammogram d/w pt, declined by patient.  She wouldn't want intervention.  DXA normal 2015.  she wanted to defer for now.   Advance directive- grandson TJ Arkie, [place]) designated if patient were incapacitated  PMH and SH reviewed  Meds, vitals, and allergies reviewed.   ROS: Per HPI unless specifically indicated in ROS section   GEN: nad, alert and oriented HEENT: mucous membranes moist NECK: supple w/o LA CV: rrr. PULM: ctab, no inc wob ABD: soft, +bs EXT: no edema SKIN: no acute rash  Diabetic foot exam: Normal inspection No skin breakdown No calluses  Normal DP pulses Normal sensation to light touch and monofilament Nails normal

## 2018-09-13 NOTE — Assessment & Plan Note (Signed)
Statin intolerant.  Continue as is.  She agrees.

## 2018-09-13 NOTE — Assessment & Plan Note (Signed)
Advance directive- grandson TJ (Timothy Milton Hyde, Jr) designated if patient were incapacitated 

## 2018-09-13 NOTE — Assessment & Plan Note (Signed)
Flu vaccine prev done.  Shingles vaccine out of stock.   Hearing- failed. Declined hearing aids.   D/w pt.   Colonoscopy not due, given her age.   Mammogram d/w pt, declined by patient.  She wouldn't want intervention.  DXA normal 2015.  she wanted to defer for now.   Advance directive- grandson TJ Carleta, [place]) designated if patient were incapacitated

## 2018-09-13 NOTE — Assessment & Plan Note (Signed)
Improved with turmeric.  No obvious adverse effect otherwise.  Continue as is.  She agrees.  Update me as needed.

## 2018-09-13 NOTE — Assessment & Plan Note (Signed)
Controlled.  Labs discussed with patient.  No change in meds.  She will update me as needed.  Continue work on diet and exercise.  She agrees.

## 2018-09-13 NOTE — Assessment & Plan Note (Addendum)
Reasonable control.  Labs discussed with patient.  Not on meds currently, would not start given her age and A1c.  She will update me as needed.  Continue work on diet and exercise.  She agrees.  Recheck periodically. >25 minutes spent in face to face time with patient, >50% spent in counselling or coordination of care.  I do not want to induce hypoglycemia.

## 2018-09-30 DIAGNOSIS — R69 Illness, unspecified: Secondary | ICD-10-CM | POA: Diagnosis not present

## 2018-10-08 ENCOUNTER — Encounter: Payer: Self-pay | Admitting: Podiatry

## 2018-10-08 ENCOUNTER — Ambulatory Visit: Payer: Medicare HMO | Admitting: Podiatry

## 2018-10-08 DIAGNOSIS — E119 Type 2 diabetes mellitus without complications: Secondary | ICD-10-CM

## 2018-10-08 DIAGNOSIS — B351 Tinea unguium: Secondary | ICD-10-CM

## 2018-10-08 DIAGNOSIS — L608 Other nail disorders: Secondary | ICD-10-CM

## 2018-10-08 DIAGNOSIS — M79675 Pain in left toe(s): Secondary | ICD-10-CM | POA: Diagnosis not present

## 2018-10-08 DIAGNOSIS — M79674 Pain in right toe(s): Secondary | ICD-10-CM | POA: Diagnosis not present

## 2018-10-08 NOTE — Progress Notes (Signed)
Complaint:  Visit Type: Patient returns to my office for continued preventative foot care services. Complaint: Patient states" my nails have grown long and thick and become painful to walk and wear shoes" Patient has been diagnosed with DM with no foot complications. The patient presents for preventative foot care services. No changes to ROS  Podiatric Exam: Vascular: dorsalis pedis and posterior tibial pulses are palpable bilateral. Capillary return is immediate. Temperature gradient is WNL. Skin turgor WNL  Sensorium: Normal Semmes Weinstein monofilament test. Normal tactile sensation bilaterally. Nail Exam: Pt has elongated ingrown toenails  . Ulcer Exam: There is no evidence of ulcer or pre-ulcerative changes or infection. Orthopedic Exam: Muscle tone and strength are WNL. No limitations in general ROM. No crepitus or effusions noted. Foot type and digits show no abnormalities. HAV 1st MPJ  Right foot. Skin: No Porokeratosis. No infection or ulcers  Diagnosis:  Onychomycosis, , Pain in right toe, pain in left toes  Treatment & Plan Procedures and Treatment: Consent by patient was obtained for treatment procedures.   Debridement of mycotic and hypertrophic toenails, 1 through 5 bilateral and clearing of subungual debris. No ulceration, no infection noted.  Return Visit-Office Procedure: Patient instructed to return to the office for a follow up visit 10 weeks  for continued evaluation and treatment.    Gardiner Barefoot DPM

## 2018-10-12 DIAGNOSIS — L814 Other melanin hyperpigmentation: Secondary | ICD-10-CM | POA: Diagnosis not present

## 2018-10-12 DIAGNOSIS — D0462 Carcinoma in situ of skin of left upper limb, including shoulder: Secondary | ICD-10-CM | POA: Diagnosis not present

## 2018-10-12 DIAGNOSIS — Z85828 Personal history of other malignant neoplasm of skin: Secondary | ICD-10-CM | POA: Diagnosis not present

## 2018-10-12 DIAGNOSIS — D485 Neoplasm of uncertain behavior of skin: Secondary | ICD-10-CM | POA: Diagnosis not present

## 2018-10-12 DIAGNOSIS — L821 Other seborrheic keratosis: Secondary | ICD-10-CM | POA: Diagnosis not present

## 2018-10-12 DIAGNOSIS — D2371 Other benign neoplasm of skin of right lower limb, including hip: Secondary | ICD-10-CM | POA: Diagnosis not present

## 2018-10-12 DIAGNOSIS — L570 Actinic keratosis: Secondary | ICD-10-CM | POA: Diagnosis not present

## 2018-10-22 DIAGNOSIS — E119 Type 2 diabetes mellitus without complications: Secondary | ICD-10-CM | POA: Diagnosis not present

## 2018-10-22 DIAGNOSIS — Z9841 Cataract extraction status, right eye: Secondary | ICD-10-CM | POA: Diagnosis not present

## 2018-10-22 DIAGNOSIS — H52223 Regular astigmatism, bilateral: Secondary | ICD-10-CM | POA: Diagnosis not present

## 2018-10-22 DIAGNOSIS — Z9842 Cataract extraction status, left eye: Secondary | ICD-10-CM | POA: Diagnosis not present

## 2018-10-22 DIAGNOSIS — H16223 Keratoconjunctivitis sicca, not specified as Sjogren's, bilateral: Secondary | ICD-10-CM | POA: Diagnosis not present

## 2018-10-22 DIAGNOSIS — H04123 Dry eye syndrome of bilateral lacrimal glands: Secondary | ICD-10-CM | POA: Diagnosis not present

## 2018-10-22 DIAGNOSIS — H5213 Myopia, bilateral: Secondary | ICD-10-CM | POA: Diagnosis not present

## 2018-10-22 LAB — HM DIABETES EYE EXAM

## 2018-10-26 ENCOUNTER — Encounter: Payer: Self-pay | Admitting: Family Medicine

## 2018-11-01 ENCOUNTER — Encounter: Payer: Self-pay | Admitting: Family Medicine

## 2018-11-02 ENCOUNTER — Encounter: Payer: Self-pay | Admitting: Family Medicine

## 2018-11-06 ENCOUNTER — Encounter: Payer: Self-pay | Admitting: Family Medicine

## 2018-11-06 ENCOUNTER — Ambulatory Visit (INDEPENDENT_AMBULATORY_CARE_PROVIDER_SITE_OTHER): Payer: Medicare HMO | Admitting: Family Medicine

## 2018-11-06 DIAGNOSIS — R05 Cough: Secondary | ICD-10-CM

## 2018-11-06 DIAGNOSIS — R4702 Dysphasia: Secondary | ICD-10-CM

## 2018-11-06 DIAGNOSIS — R059 Cough, unspecified: Secondary | ICD-10-CM

## 2018-11-06 MED ORDER — BENZONATATE 200 MG PO CAPS: 200.0000 mg | ORAL_CAPSULE | Freq: Three times a day (TID) | ORAL | 1 refills | Status: DC | PRN

## 2018-11-06 NOTE — Progress Notes (Signed)
She had a cold around thanksgiving and got over that but then got sick again with a cough around xmas.  She had to put off her xmas dinner.  She cooked veggies and cake and others helped out.  The cough is better now.  Still with some congestion.  Prev wheeze resolved.  No fevers.  No vomiting.  No diarrhea.  No ST.  No ear pain.  No rash.    Swallowing d/w pt. She "has to make sure I have chewed my food right."  She avoids some foods like broccoli.  She had noted some episodic troubles over the years.  No trouble with liquids.  She had some sensation of food sticking in the lower esophagus.  No heartburn.  No choking. No abd pain, no blood in stool.   She declined GI eval at this point, d/w pt.  See avs.     Meds, vitals, and allergies reviewed.   ROS: Per HPI unless specifically indicated in ROS section   GEN: nad, alert and oriented HEENT: mucous membranes moist, OP wnl, TM wnl NECK: supple w/o LA CV: rrr. PULM: ctab, no inc wob ABD: soft, +bs EXT: no edema SKIN: no acute rash

## 2018-11-06 NOTE — Patient Instructions (Addendum)
Cut/chew food well.  If you keep having trouble then we can set you up with GI.  The cough should slowly get better.   Likely a post infectious cough.  Use tessalon if needed.  Take care.  Glad to see you.

## 2018-11-08 DIAGNOSIS — R05 Cough: Secondary | ICD-10-CM | POA: Insufficient documentation

## 2018-11-08 DIAGNOSIS — R4702 Dysphasia: Secondary | ICD-10-CM | POA: Insufficient documentation

## 2018-11-08 DIAGNOSIS — R059 Cough, unspecified: Secondary | ICD-10-CM | POA: Insufficient documentation

## 2018-11-08 NOTE — Assessment & Plan Note (Signed)
The cough should slowly get better.   Likely a post infectious cough.  Use tessalon if needed.  Lungs are clear.  Update me as needed.  She agrees.  Nontoxic.  Okay for outpatient follow-up.

## 2018-11-08 NOTE — Assessment & Plan Note (Signed)
Discussed with patient about options.  Advised to cut/chew food well.   If she keeps having trouble then we can set her up with GI.  She declined GI evaluation at this point.

## 2018-12-16 DIAGNOSIS — R69 Illness, unspecified: Secondary | ICD-10-CM | POA: Diagnosis not present

## 2018-12-17 ENCOUNTER — Ambulatory Visit: Payer: Medicare HMO | Admitting: Podiatry

## 2018-12-17 ENCOUNTER — Encounter: Payer: Self-pay | Admitting: Podiatry

## 2018-12-17 DIAGNOSIS — E119 Type 2 diabetes mellitus without complications: Secondary | ICD-10-CM

## 2018-12-17 DIAGNOSIS — M79675 Pain in left toe(s): Secondary | ICD-10-CM

## 2018-12-17 DIAGNOSIS — L608 Other nail disorders: Secondary | ICD-10-CM | POA: Diagnosis not present

## 2018-12-17 DIAGNOSIS — M79674 Pain in right toe(s): Secondary | ICD-10-CM | POA: Diagnosis not present

## 2018-12-17 DIAGNOSIS — B351 Tinea unguium: Secondary | ICD-10-CM

## 2018-12-17 NOTE — Progress Notes (Signed)
Complaint:  Visit Type: Patient returns to my office for continued preventative foot care services. Complaint: Patient states" my nails have grown long and thick and become painful to walk and wear shoes" Patient has been diagnosed with DM with no foot complications. The patient presents for preventative foot care services. No changes to ROS  Podiatric Exam: Vascular: dorsalis pedis and posterior tibial pulses are palpable bilateral. Capillary return is immediate. Temperature gradient is WNL. Skin turgor WNL  Sensorium: Normal Semmes Weinstein monofilament test. Normal tactile sensation bilaterally. Nail Exam: Pt has elongated ingrown toenails  . Ulcer Exam: There is no evidence of ulcer or pre-ulcerative changes or infection. Orthopedic Exam: Muscle tone and strength are WNL. No limitations in general ROM. No crepitus or effusions noted. Foot type and digits show no abnormalities. HAV 1st MPJ  Right foot. Skin: No Porokeratosis. No infection or ulcers  Diagnosis:  Onychomycosis, , Pain in right toe, pain in left toes  Treatment & Plan Procedures and Treatment: Consent by patient was obtained for treatment procedures.   Debridement of mycotic and hypertrophic toenails, 1 through 5 bilateral and clearing of subungual debris. No ulceration, no infection noted.  Return Visit-Office Procedure: Patient instructed to return to the office for a follow up visit 10 weeks  for continued evaluation and treatment.    Gardiner Barefoot DPM

## 2019-02-25 ENCOUNTER — Ambulatory Visit: Payer: Medicare HMO | Admitting: Podiatry

## 2019-03-04 ENCOUNTER — Other Ambulatory Visit: Payer: Self-pay | Admitting: Family Medicine

## 2019-03-04 DIAGNOSIS — I10 Essential (primary) hypertension: Secondary | ICD-10-CM

## 2019-03-04 DIAGNOSIS — E119 Type 2 diabetes mellitus without complications: Secondary | ICD-10-CM

## 2019-03-10 ENCOUNTER — Other Ambulatory Visit (INDEPENDENT_AMBULATORY_CARE_PROVIDER_SITE_OTHER): Payer: Medicare HMO

## 2019-03-10 DIAGNOSIS — E119 Type 2 diabetes mellitus without complications: Secondary | ICD-10-CM

## 2019-03-10 LAB — POCT GLYCOSYLATED HEMOGLOBIN (HGB A1C): Hemoglobin A1C: 7.4 % — AB (ref 4.0–5.6)

## 2019-03-12 ENCOUNTER — Encounter: Payer: Self-pay | Admitting: Family Medicine

## 2019-03-12 ENCOUNTER — Ambulatory Visit (INDEPENDENT_AMBULATORY_CARE_PROVIDER_SITE_OTHER): Payer: Medicare HMO | Admitting: Family Medicine

## 2019-03-12 DIAGNOSIS — E119 Type 2 diabetes mellitus without complications: Secondary | ICD-10-CM

## 2019-03-12 MED ORDER — GLUCOSE BLOOD VI STRP: ORAL_STRIP | 3 refills | Status: DC

## 2019-03-12 MED ORDER — LISINOPRIL 10 MG PO TABS: 5.0000 mg | ORAL_TABLET | Freq: Every day | ORAL | Status: DC

## 2019-03-12 NOTE — Progress Notes (Signed)
Interactive audio and video telecommunications were attempted between this provider and patient, however failed, due to patient having technical difficulties OR patient did not have access to video capability.  We continued and completed visit with audio only.   Virtual Visit via Telephone Note  I connected with patient on 03/12/19 at 3:40 PM by telephone and verified that I am speaking with the correct person using two identifiers.  Location of patient: home  Location of MD: [place] Name of referring provider (if blank then none associated): Names per persons and role in encounter:  MD: Earlyne Iba, Patient: name listed above.    I discussed the limitations, risks, security and privacy concerns of performing an evaluation and management service by telephone and the availability of in person appointments. I also discussed with the patient that there may be a patient responsible charge related to this service. The patient expressed understanding and agreed to proceed.  CC: DM  History of Present Illness:   Diabetes:  No meds.   Hypoglycemic episodes:no Hyperglycemic episodes:no Feet problems: some episodic swelling.  Some mild tingling at baseline.  No skin breakdown.  Blood Sugars averaging: averaging 164 this week.  eye exam within last year: yes A1c at goal 7.4.  D/w pt Discussed diet and exercise.    BP 146/73, 130/75 on recheck.  Pulse 80.  She isn't lightheaded.     Observations/Objective:nad Speech wnl  Assessment and Plan: DM2- no change in meds.  Continue work on diet.  Labs discussed with patient.  Goal to avoid hypoglycemia.  She agrees with plan.  Follow Up Instructions: recheck in 6 months.    I discussed the assessment and treatment plan with the patient. The patient was provided an opportunity to ask questions and all were answered. The patient agreed with the plan and demonstrated an understanding of the instructions.   The patient was advised to  call back or seek an in-person evaluation if the symptoms worsen or if the condition fails to improve as anticipated.  I provided 15 minutes of non-face-to-face time during this encounter.  Elsie Stain, MD

## 2019-03-13 DIAGNOSIS — R69 Illness, unspecified: Secondary | ICD-10-CM | POA: Diagnosis not present

## 2019-03-14 NOTE — Assessment & Plan Note (Signed)
no change in meds.  Continue work on diet.  Labs discussed with patient.  Goal to avoid hypoglycemia.  She agrees with plan.  Follow Up Instructions: recheck in 6 months.

## 2019-03-18 DIAGNOSIS — R69 Illness, unspecified: Secondary | ICD-10-CM | POA: Diagnosis not present

## 2019-04-10 ENCOUNTER — Encounter: Payer: Self-pay | Admitting: Family Medicine

## 2019-04-13 ENCOUNTER — Telehealth: Payer: Self-pay | Admitting: Family Medicine

## 2019-04-13 NOTE — Telephone Encounter (Signed)
I spoke with pt; pt said that the tick area is on back of leg and now appears to be a big bruise the size of fist ; no warmth to the area now and not swollen and does not have itching now. Is not bothering pt now.pt wants an appt to see Dr Damita Dunnings to make sure it is OK. No fever,chills,S/T,cough,SOB,muscle pain,h/a,diarrhea,noloss of taste/smell. No travel,no known exposure to covid or flu. Pt cannot come today and scheduled appt on 04/15/19 at 9:30.any changes prior to appt pt will cb. FYI to Dr Damita Dunnings.

## 2019-04-13 NOTE — Telephone Encounter (Signed)
Noted. Thanks.

## 2019-04-13 NOTE — Telephone Encounter (Signed)
Please triage patient regarding tick bite.  Thanks.  See my chart message.

## 2019-04-15 ENCOUNTER — Ambulatory Visit (INDEPENDENT_AMBULATORY_CARE_PROVIDER_SITE_OTHER): Payer: Medicare HMO | Admitting: Family Medicine

## 2019-04-15 ENCOUNTER — Other Ambulatory Visit: Payer: Self-pay

## 2019-04-15 ENCOUNTER — Encounter: Payer: Self-pay | Admitting: Family Medicine

## 2019-04-15 DIAGNOSIS — S70362A Insect bite (nonvenomous), left thigh, initial encounter: Secondary | ICD-10-CM

## 2019-04-15 DIAGNOSIS — W57XXXA Bitten or stung by nonvenomous insect and other nonvenomous arthropods, initial encounter: Secondary | ICD-10-CM

## 2019-04-15 MED ORDER — TRIAMCINOLONE ACETONIDE 0.5 % EX CREA: 1.0000 "application " | TOPICAL_CREAM | Freq: Two times a day (BID) | CUTANEOUS | 0 refills | Status: DC | PRN

## 2019-04-15 NOTE — Patient Instructions (Signed)
I think you had an inflammatory reaction related to the bite.  This can happen even if you don't have an infection.   Use triamcinolone on the area if needed for itching.   As long as you feel well without a fever, then you don't have to do anything else.   Update me as needed.   Take care.  Glad to see you.

## 2019-04-15 NOTE — Progress Notes (Signed)
She had a known tick bite on L posterior thigh recently, then local irritation at the site with itching.  It was worse over the weekend but better by now.  No fevers or other systemic sx.  She had local redness, then that progressed a little ways down the posterior L thigh.  Had used hydrocortisone in the meantime.  Not back to baseline but clearly better recently.  Still on baseline meds.   Her sugar has been ~160 recently.    Meds, vitals, and allergies reviewed.   ROS: Per HPI unless specifically indicated in ROS section   nad ncat rrr ctab Minimal irritation on the L posterior thigh with central bite site. No fluctuant mass. No spreading erythema. Not ttp.

## 2019-04-18 DIAGNOSIS — W57XXXA Bitten or stung by nonvenomous insect and other nonvenomous arthropods, initial encounter: Secondary | ICD-10-CM | POA: Insufficient documentation

## 2019-04-18 NOTE — Assessment & Plan Note (Signed)
I think she had a local inflammatory reaction related to the bite.  This can happen even without an infection.  Use triamcinolone on the area if needed for itching.  As long as she is feeling well without a fever, then she does not have to do anything else.  Update me as needed.   She agrees with plan.

## 2019-05-27 DIAGNOSIS — L570 Actinic keratosis: Secondary | ICD-10-CM | POA: Diagnosis not present

## 2019-05-27 DIAGNOSIS — Z85828 Personal history of other malignant neoplasm of skin: Secondary | ICD-10-CM | POA: Diagnosis not present

## 2019-05-27 DIAGNOSIS — D485 Neoplasm of uncertain behavior of skin: Secondary | ICD-10-CM | POA: Diagnosis not present

## 2019-05-27 DIAGNOSIS — C44622 Squamous cell carcinoma of skin of right upper limb, including shoulder: Secondary | ICD-10-CM | POA: Diagnosis not present

## 2019-07-14 DIAGNOSIS — L570 Actinic keratosis: Secondary | ICD-10-CM | POA: Diagnosis not present

## 2019-07-14 DIAGNOSIS — D0472 Carcinoma in situ of skin of left lower limb, including hip: Secondary | ICD-10-CM | POA: Diagnosis not present

## 2019-07-14 DIAGNOSIS — D044 Carcinoma in situ of skin of scalp and neck: Secondary | ICD-10-CM | POA: Diagnosis not present

## 2019-07-14 DIAGNOSIS — C44311 Basal cell carcinoma of skin of nose: Secondary | ICD-10-CM | POA: Diagnosis not present

## 2019-07-14 DIAGNOSIS — C44629 Squamous cell carcinoma of skin of left upper limb, including shoulder: Secondary | ICD-10-CM | POA: Diagnosis not present

## 2019-07-14 DIAGNOSIS — Z85828 Personal history of other malignant neoplasm of skin: Secondary | ICD-10-CM | POA: Diagnosis not present

## 2019-07-14 DIAGNOSIS — C44622 Squamous cell carcinoma of skin of right upper limb, including shoulder: Secondary | ICD-10-CM | POA: Diagnosis not present

## 2019-07-14 DIAGNOSIS — C44722 Squamous cell carcinoma of skin of right lower limb, including hip: Secondary | ICD-10-CM | POA: Diagnosis not present

## 2019-07-14 DIAGNOSIS — D485 Neoplasm of uncertain behavior of skin: Secondary | ICD-10-CM | POA: Diagnosis not present

## 2019-08-03 DIAGNOSIS — Z85828 Personal history of other malignant neoplasm of skin: Secondary | ICD-10-CM | POA: Diagnosis not present

## 2019-08-03 DIAGNOSIS — C44311 Basal cell carcinoma of skin of nose: Secondary | ICD-10-CM | POA: Diagnosis not present

## 2019-08-18 DIAGNOSIS — R69 Illness, unspecified: Secondary | ICD-10-CM | POA: Diagnosis not present

## 2019-09-02 ENCOUNTER — Other Ambulatory Visit: Payer: Self-pay | Admitting: Family Medicine

## 2019-09-02 ENCOUNTER — Other Ambulatory Visit (INDEPENDENT_AMBULATORY_CARE_PROVIDER_SITE_OTHER): Payer: Medicare HMO

## 2019-09-02 DIAGNOSIS — E119 Type 2 diabetes mellitus without complications: Secondary | ICD-10-CM

## 2019-09-02 LAB — COMPREHENSIVE METABOLIC PANEL
ALT: 12 U/L (ref 0–35)
AST: 14 U/L (ref 0–37)
Albumin: 4 g/dL (ref 3.5–5.2)
Alkaline Phosphatase: 57 U/L (ref 39–117)
BUN: 14 mg/dL (ref 6–23)
CO2: 32 mEq/L (ref 19–32)
Calcium: 9.3 mg/dL (ref 8.4–10.5)
Chloride: 104 mEq/L (ref 96–112)
Creatinine, Ser: 1.14 mg/dL (ref 0.40–1.20)
GFR: 44.66 mL/min — ABNORMAL LOW (ref >60.00)
Glucose, Bld: 141 mg/dL — ABNORMAL HIGH (ref 70–99)
Potassium: 4.2 mEq/L (ref 3.5–5.1)
Sodium: 142 mEq/L (ref 135–145)
Total Bilirubin: 0.5 mg/dL (ref 0.2–1.2)
Total Protein: 6.2 g/dL (ref 6.0–8.3)

## 2019-09-02 LAB — CBC WITH DIFFERENTIAL/PLATELET
Basophils Absolute: 0 10*3/uL (ref 0.0–0.1)
Basophils Relative: 0.3 % (ref 0.0–3.0)
Eosinophils Absolute: 0.2 10*3/uL (ref 0.0–0.7)
Eosinophils Relative: 3 % (ref 0.0–5.0)
HCT: 40.2 % (ref 36.0–46.0)
Hemoglobin: 13.2 g/dL (ref 12.0–15.0)
Lymphocytes Relative: 27.3 % (ref 12.0–46.0)
Lymphs Abs: 2.2 10*3/uL (ref 0.7–4.0)
MCHC: 32.9 g/dL (ref 30.0–36.0)
MCV: 88.1 fl (ref 78.0–100.0)
Monocytes Absolute: 0.8 10*3/uL (ref 0.1–1.0)
Monocytes Relative: 9.7 % (ref 3.0–12.0)
Neutro Abs: 4.8 10*3/uL (ref 1.4–7.7)
Neutrophils Relative %: 59.7 % (ref 43.0–77.0)
Platelets: 327 10*3/uL (ref 150.0–400.0)
RBC: 4.56 Mil/uL (ref 3.87–5.11)
RDW: 13.2 % (ref 11.5–15.5)
WBC: 8 10*3/uL (ref 4.0–10.5)

## 2019-09-02 LAB — LIPID PANEL
Cholesterol: 233 mg/dL — ABNORMAL HIGH (ref 0–200)
HDL: 36.1 mg/dL — ABNORMAL LOW (ref >39.00)
LDL Cholesterol: 162 mg/dL — ABNORMAL HIGH (ref 0–99)
NonHDL: 196.41
Total CHOL/HDL Ratio: 6
Triglycerides: 171 mg/dL — ABNORMAL HIGH (ref 0.0–149.0)
VLDL: 34.2 mg/dL (ref 0.0–40.0)

## 2019-09-02 LAB — URIC ACID: Uric Acid, Serum: 5.6 mg/dL (ref 2.4–7.0)

## 2019-09-02 LAB — HEMOGLOBIN A1C: Hgb A1c MFr Bld: 7.4 % — ABNORMAL HIGH (ref 4.6–6.5)

## 2019-09-06 ENCOUNTER — Other Ambulatory Visit: Payer: Medicare HMO

## 2019-09-13 ENCOUNTER — Ambulatory Visit (INDEPENDENT_AMBULATORY_CARE_PROVIDER_SITE_OTHER): Payer: Medicare HMO | Admitting: Family Medicine

## 2019-09-13 ENCOUNTER — Other Ambulatory Visit: Payer: Self-pay

## 2019-09-13 ENCOUNTER — Ambulatory Visit: Payer: Medicare HMO

## 2019-09-13 ENCOUNTER — Encounter: Payer: Self-pay | Admitting: Family Medicine

## 2019-09-13 VITALS — BP 120/60 | HR 96 | Temp 98.0°F | Ht 63.75 in | Wt 182.2 lb

## 2019-09-13 DIAGNOSIS — I10 Essential (primary) hypertension: Secondary | ICD-10-CM

## 2019-09-13 DIAGNOSIS — E119 Type 2 diabetes mellitus without complications: Secondary | ICD-10-CM | POA: Diagnosis not present

## 2019-09-13 DIAGNOSIS — Z7189 Other specified counseling: Secondary | ICD-10-CM

## 2019-09-13 DIAGNOSIS — R0789 Other chest pain: Secondary | ICD-10-CM | POA: Diagnosis not present

## 2019-09-13 DIAGNOSIS — Z0000 Encounter for general adult medical examination without abnormal findings: Secondary | ICD-10-CM

## 2019-09-13 MED ORDER — NITROGLYCERIN 0.4 MG SL SUBL: 0.4000 mg | SUBLINGUAL_TABLET | SUBLINGUAL | 3 refills | Status: DC | PRN

## 2019-09-13 NOTE — Patient Instructions (Addendum)
Please check your lisinopril dose and let me know. Check with your insurance to see if they will cover the shingrix and tetanus shots.   We'll call about a cardiology appointment.  If you have any chest pain, then use the nitroglycerin and seek care/dial 911.  Take care.  Glad to see you.  Plan on recheck in 6 months about your sugar, sooner if needed.  Update me as needed in the meantime.

## 2019-09-13 NOTE — Progress Notes (Signed)
I have personally reviewed the Medicare Annual Wellness questionnaire and have noted 1. The patient's medical and social history 2. Their use of alcohol, tobacco or illicit drugs 3. Their current medications and supplements 4. The patient's functional ability including ADL's, fall risks, home safety risks and hearing or visual             impairment. 5. Diet and physical activities 6. Evidence for depression or mood disorders  The patients weight, height, BMI have been recorded in the chart and visual acuity is per eye clinic.  I have made referrals, counseling and provided education to the patient based review of the above and I have provided the pt with a written personalized care plan for preventive services.  Provider list updated- see scanned forms.  Routine anticipatory guidance given to patient.  See health maintenance. The possibility exists that previously documented standard health maintenance information may have been brought forward from a previous encounter into this note.  If needed, that same information has been updated to reflect the current situation based on today's encounter.    Flu 2020 Shingles discussed with patient.   PNA up-to-date Tetanus discussed with patient Colon cancer screening not due given her age. Breast cancer screening declined by patient. Bone density test declined by patient. Advance directive- grandson TJ Ellasandra, As[place]) designated if patient were incapacitated Cognitive function addressed- see scanned forms- and if abnormal then additional documentation follows.   She had nasal surgery in the meantime, with a skin graft.  I will defer.  She agrees.  Diabetes:  No meds.  Hypoglycemic episodes:no Hyperglycemic episodes:no Feet problems:mild tingling but not worse than prev.   Blood Sugars averaging:~160 recently.   eye exam within last year: yes, f/u pending.   Labs d/w pt.    Hypertension:    Using medication without problems or  lightheadedness:  Chest pain with exertion: see below.  Edema:trace L ankle edema qhs at baseline, resolved by next AM, old finding.  Short of breath:no I asked her to verify her lisinopril dose.  See follow-up my chart message.  A few days ago she had an episode of chest pain that she initially thought was indigestion.  She woke up with it but didn't tell family until the next day.  She has some throbbing L arm pain, after she may have missed a dose of BP medicine.  She had elevated BP at the time but normalized now.  No CP now or since.  She didn't have CP while working in the yard (that could have caused some of the L arm pain).  No episodes like this prev.  She agrees to update others/seek care if sx return.    We talked about recent stressors, she was looking all over her house for some mild paperwork, which she fortunately found.  PMH and SH reviewed  Meds, vitals, and allergies reviewed.   ROS: Per HPI.  Unless specifically indicated otherwise in HPI, the patient denies:  General: fever. Eyes: acute vision changes ENT: sore throat Cardiovascular: chest pain Respiratory: SOB GI: vomiting GU: dysuria Musculoskeletal: acute back pain Derm: acute rash Neuro: acute motor dysfunction Psych: worsening mood Endocrine: polydipsia Heme: bleeding Allergy: hayfever  GEN: nad, alert and oriented HEENT: ncat NECK: supple w/o LA CV: rrr. PULM: ctab, no inc wob ABD: soft, +bs EXT: no edema SKIN: no acute rash  Diabetic foot exam: Normal inspection No skin breakdown No calluses  Normal DP pulses Normal sensation to light touch and  monofilament Nails normal  Health Maintenance  Topic Date Due  . TETANUS/TDAP  11/04/2019 (Originally 06/23/2018)  . OPHTHALMOLOGY EXAM  10/23/2019  . HEMOGLOBIN A1C  03/02/2020  . FOOT EXAM  09/12/2020  . INFLUENZA VACCINE  Completed  . DEXA SCAN  Completed  . PNA vac Low Risk Adult  Completed

## 2019-09-14 ENCOUNTER — Encounter: Payer: Self-pay | Admitting: Family Medicine

## 2019-09-15 DIAGNOSIS — R0789 Other chest pain: Secondary | ICD-10-CM | POA: Insufficient documentation

## 2019-09-15 MED ORDER — LISINOPRIL 10 MG PO TABS: 10.0000 mg | ORAL_TABLET | Freq: Every day | ORAL | Status: DC

## 2019-09-15 NOTE — Assessment & Plan Note (Signed)
Statin intolerant.  No chest pain now.  Discussed cardiology referral.  Ordered.  Routine cautions given.  If she has further symptoms then she will use nitroglycerin and seek care.  She agrees with plan.

## 2019-09-15 NOTE — Assessment & Plan Note (Signed)
Flu 2020 Shingles discussed with patient.   PNA up-to-date Tetanus discussed with patient Colon cancer screening not due given her age. Breast cancer screening declined by patient. Bone density test declined by patient. Advance directive- grandson TJ Nikeisha, Ellyson) designated if patient were incapacitated Cognitive function addressed- see scanned forms- and if abnormal then additional documentation follows.

## 2019-09-15 NOTE — Assessment & Plan Note (Signed)
Advance directive- grandson Cathy Larsen, Jr) designated if patient were incapacitated 

## 2019-09-15 NOTE — Assessment & Plan Note (Signed)
A1c is reasonable.  No change in meds.  She will update me as needed.  Recheck periodically.  She agrees.  See after visit summary.

## 2019-09-15 NOTE — Assessment & Plan Note (Signed)
I asked her to verify her lisinopril dose.  See follow-up my chart message.  No change in meds at this point otherwise.  Blood pressure is reasonable today.

## 2019-09-24 ENCOUNTER — Telehealth: Payer: Self-pay | Admitting: Cardiovascular Disease

## 2019-09-24 NOTE — Telephone Encounter (Signed)
New Message  Patient's grandson Tj is calling in to get approval to accompany patient to the her appointment with Dr. Acie Fredrickson on 09/28/19. States that patient needs his assistance walking and being able to hear. Please give patient's grandson Tj a call to confirm.

## 2019-09-24 NOTE — Telephone Encounter (Signed)
    COVID-19 Pre-Screening Questions:  . In the past 7 to 10 days have you had a cough,  shortness of breath, headache, congestion, fever (100 or greater) body aches, chills, sore throat, or sudden loss of taste or sense of smell? no . Have you been around anyone with known Covid 19. no . Have you been around anyone who is awaiting Covid 19 test results in the past 7 to 10 days? no . Have you been around anyone who has been exposed to Covid 19, or has mentioned symptoms of Covid 19 within the past 7 to 10 days? no  If you have any concerns/questions about symptoms patients report during screening (either on the phone or at threshold). Contact the provider seeing the patient or DOD for further guidance.  If neither are available contact a member of the leadership team.  Grandson answers no for himself and pt to above questions.   Pt is scheduled for new patient appointment with Dr Acie Fredrickson on 11/24. I spoke with grandson who reports he or another family member usually attend office visits with pt. He reports pt needs assistance walking and has trouble remembering instructions.  I told him it would be OK to come to appointment with pt.  Yolanda Bonine is aware pt should arrive 15 minutes prior to appointment and that he and pt need to wear a mask to appointment.

## 2019-09-27 ENCOUNTER — Encounter: Payer: Self-pay | Admitting: Cardiovascular Disease

## 2019-09-27 NOTE — Progress Notes (Signed)
Cardiology Office Note:    Date:  09/28/2019   ID:  Cathy Larsen, DOB 1926-01-17, MRN [id number]  PCP:  Tonia Ghent, MD  Cardiologist:  Daziah Hesler   Electrophysiologist:  None   Referring MD: Tonia Ghent, MD   Chief Complaint  Patient presents with  . Chest Pain    Problem List 1.   Chest pain  2. Heart murmur 3. Hyperlipidemia  4.  Essential HTN   History of Present Illness:    Cathy Larsen is a 85 y.o. female with a hx of chest disfomfort.  We are asked to see her today by Dr. Damita Dunnings for further evaluation of these chest pains  Seen with her grandson, Cathy Larsen Previous patient of Dr. Verl Blalock  She woke up with an episode of cp in the middle of the night.  Was not very severe.   Had some pain in her arms. Lasted for several hours  Had been doing some yard work ( picking up sticks) the day before  Had persistent left sided chest pain the following day   Had an episode of syncope many years ago .  Had a heart cath  Has a hx of hyperlipidemia .  Was on a statin but it caused severe muscle aches  Has hx of HTN.   Still eating salty foods.  Eats prepared meals very often .   Frozen pot pies.     Past Medical History:  Diagnosis Date  . Arthritis    feet and hands  . CAD (coronary artery disease)    cath 1998 with 40% lesion in LAD but no other disease  . Carpal tunnel syndrome   . Diabetes mellitus   . Gout 03/1998  . Hyperlipidemia 08/1997  . Hypertension 09/23/1995  . Skin cancer     Past Surgical History:  Procedure Laterality Date  . BIOPSY BREAST  03/2000   Ductogram right breast w/ biopsy- obstructing lesion benign by path. (Dr. Annamaria Boots)   . BLADDER SURGERY     bladder tack   . CARDIAC CATHETERIZATION  03/1997   40% occulsion lad   . CARPAL TUNNEL RELEASE Right 03/14/2014   Procedure: RIGHT CARPAL TUNNEL RELEASE;  Surgeon: Tennis Must, MD;  Location: [facility];  Service: Orthopedics;  Laterality: Right;  . CATARACT EXTRACTION    .  PARTIAL HYSTERECTOMY    . VAGINAL DELIVERY     x 3  . YAG LASER APPLICATION Right [id number]   Procedure: YAG LASER APPLICATION;  Surgeon: Williams Che, MD;  Location: [facility];  Service: Ophthalmology;  Laterality: Right;    Current Medications: Current Meds  Medication Sig  . BOSWELLIA SERRATA PO Take by mouth daily.  . Flaxseed, Linseed, (FLAX SEED OIL) 1000 MG CAPS Take 1 capsule by mouth daily.  . Ginger, Zingiber officinalis, (GINGER ROOT) 550 MG CAPS Take 96 mg by mouth 2 (two) times daily. (3 capsules)  . glucose blood (ONE TOUCH ULTRA TEST) test strip USE DAILY TO CHECK SUGAR.  Dx 11.9.  . Multiple Vitamin (MULTIVITAMIN WITH MINERALS) TABS Take 1 tablet by mouth daily. ALIVE Womens' 50+  . nitroGLYCERIN (NITROSTAT) 0.4 MG SL tablet Place 1 tablet (0.4 mg total) under the tongue every 5 (five) minutes as needed for chest pain (max 3 tabs in 15 minutes).  . Omega-3 Fatty Acids (FISH OIL) 1000 MG CAPS Take 1,000 mg by mouth daily.   . Turmeric 500 MG CAPS Take by mouth daily.  . [DISCONTINUED]  lisinopril (ZESTRIL) 10 MG tablet Take 1 tablet (10 mg total) by mouth daily.     Allergies:   Statins, Keflex [cephalexin], and Requip [ropinirole hcl]   Social History   Socioeconomic History  . Marital status: Married    Spouse name: Not on file  . Number of children: 3  . Years of education: Not on file  . Highest education level: Not on file  Occupational History  . 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 Financial resource strain: Not on file  . Food insecurity    Worry: Not on file    Inability: Not on file  . Transportation needs    Medical: Not on file    Non-medical: Not on file  Tobacco Use  . Smoking status: Never Smoker  . Smokeless tobacco: Never Used  Substance and Sexual Activity  . Alcohol use: No    Alcohol/week: 0.0 standard drinks  . Drug use: No  . Sexual activity: Not Currently  Lifestyle  . Physical activity    Days per week: Not on file    Minutes  per session: Not on file  . Stress: Not on file  Relationships  . Social Herbalist on phone: Not on file    Gets together: Not on file    Attends religious service: Not on file    Active member of club or organization: Not on file    Attends meetings of clubs or organizations: Not on file    Relationship status: Not on file  Other Topics Concern  . Not on file  Social History Narrative   Widowed 10/2012, husband had CVA and dementia   Grandson lives with patient as of 2018     Family History: The patient's family history includes Cancer in her brother, brother, father, and mother; Hypertension in her sister and sister; Kidney failure in her mother. There is no history of Diabetes or Stroke.  ROS:   Please see the history of present illness.     All other systems reviewed and are negative.  EKGs/Labs/Other Studies Reviewed:    The following studies were reviewed today:   EKG:  09/15/19   :  NSR , no ST or T wave changes.    Recent Labs: 09/02/2019: ALT 12; BUN 14; Creatinine, Ser 1.14; Hemoglobin 13.2; Platelets 327.0; Potassium 4.2; Sodium 142  Recent Lipid Panel    Component Value Date/Time   CHOL 233 (H) 09/02/2019 0934   TRIG 171.0 (H) 09/02/2019 0934   HDL 36.10 (L) 09/02/2019 0934   CHOLHDL 6 09/02/2019 0934   VLDL 34.2 09/02/2019 0934   LDLCALC 162 (H) 09/02/2019 0934   LDLDIRECT 142.1 01/04/2013 1001    Physical Exam:    VS:  BP (!) 162/110   Pulse 82   Ht 5\' 4"  (1.626 m)   Wt 182 lb 12.8 oz (82.9 kg)   SpO2 97%   BMI 31.38 kg/m     Wt Readings from Last 3 Encounters:  09/28/19 182 lb 12.8 oz (82.9 kg)  09/13/19 182 lb 3 oz (82.6 kg)  04/15/19 182 lb 6 oz (82.7 kg)     GEN:   Elderly female ,  NAD ,  Somewhat frail.     HEENT: Normal NECK: No JVD; No carotid bruits LYMPHATICS: No lymphadenopathy CARDIAC: RRR,  Very soft systolic murmur  RESPIRATORY:  Clear to auscultation without rales, wheezing or rhonchi  ABDOMEN: Soft,  non-tender, non-distended MUSCULOSKELETAL:  No edema; No deformity  SKIN: Warm and dry NEUROLOGIC:  Alert and oriented x 3 PSYCHIATRIC:  Normal affect   ASSESSMENT:    No diagnosis found. PLAN:      1. Chest discomfort: Mrs. Chagoya presents for further evaluation of an episode of chest pain.  She is somewhat elderly and frail and I hesitate to start down the path of an ischemic work-up that might lead to heart catheterization.  I think that she is probably relatively poor candidate for heart cath.  I do think there are several things that we can do to help with her chest pain even if it was real angina.  She is markedly hypertensive today.  She admits to eating a lot more salt than she should.  We will discontinue the lisinopril and start her on losartan 40 mg a day.  We will add hydrochlorothiazide 25 mg a day and potassium chloride 10 mEq a day.  We will have her return to see an APP in approximately 1 month.  We can consider adding low-dose  beta-blocker such as carvedilol, or titrating up her other medications as needed.  If she does end up having episodes that are concerning for angina I would start her on low-dose isosorbide.  I would hesitate to refer  her for invasive procedures such as heart catheterization.  2.  Hypertension: She admits to eating more salt than she should.  She eats banquet chicken pot pies almost on a daily basis.  She also has been on more medications but developed some orthostasis symptoms.  We will stop the lisinopril and start her losartan as noted above.  We will also add HCTZ 25 mg a day and potassium chloride 10 mg a day.  We will titrate up her medications as needed/as tolerated.  3.  Hyperlipidemia: The patient was tried on a statin but did not tolerate it.  I have asked her to work on eating lower fats in her diet.  We will consider adding Zetia in the future.  Medication Adjustments/Labs and Tests Ordered: Current medicines are reviewed at length with the  patient today.  Concerns regarding medicines are outlined above.  No orders of the defined types were placed in this encounter.  Meds ordered this encounter  Medications  . losartan (COZAAR) 50 MG tablet    Sig: Take 1 tablet (50 mg total) by mouth daily.    Dispense:  30 tablet    Refill:  11  . hydrochlorothiazide (HYDRODIURIL) 25 MG tablet    Sig: Take 1 tablet (25 mg total) by mouth daily.    Dispense:  30 tablet    Refill:  11  . potassium chloride (KLOR-CON) 10 MEQ tablet    Sig: Take 1 tablet (10 mEq total) by mouth daily.    Dispense:  30 tablet    Refill:  11    There are no Patient Instructions on file for this visit.   Signed, Mertie Moores, MD  09/28/2019 3:37 PM    [facility]

## 2019-09-28 ENCOUNTER — Ambulatory Visit: Payer: Medicare HMO | Admitting: Cardiovascular Disease

## 2019-09-28 ENCOUNTER — Other Ambulatory Visit: Payer: Self-pay

## 2019-09-28 ENCOUNTER — Encounter: Payer: Self-pay | Admitting: Cardiovascular Disease

## 2019-09-28 VITALS — BP 162/110 | HR 82 | Ht 64.0 in | Wt 182.8 lb

## 2019-09-28 DIAGNOSIS — R0789 Other chest pain: Secondary | ICD-10-CM | POA: Diagnosis not present

## 2019-09-28 DIAGNOSIS — I10 Essential (primary) hypertension: Secondary | ICD-10-CM | POA: Diagnosis not present

## 2019-09-28 MED ORDER — HYDROCHLOROTHIAZIDE 25 MG PO TABS: 25.0000 mg | ORAL_TABLET | Freq: Every day | ORAL | 11 refills | Status: DC

## 2019-09-28 MED ORDER — POTASSIUM CHLORIDE ER 10 MEQ PO TBCR: 10.0000 meq | EXTENDED_RELEASE_TABLET | Freq: Every day | ORAL | 11 refills | Status: DC

## 2019-09-28 MED ORDER — LOSARTAN POTASSIUM 50 MG PO TABS: 50.0000 mg | ORAL_TABLET | Freq: Every day | ORAL | 11 refills | Status: DC

## 2019-09-28 NOTE — Patient Instructions (Signed)
Medication Instructions:  Your physician has recommended you make the following change in your medication:  STOP Lisinopril START Losartan 50 mg once daily in the morning START HCTZ (Hydrochlorothiazide) 25 mg once daily in the morning START Kdur/Klor con (Potassium chloride) 10 mEq once daily  *If you need a refill on your cardiac medications before your next appointment, please call your pharmacy*   Lab Work: None Ordered    Testing/Procedures: None Ordered   Follow-Up: At [facility], you and your health needs are our priority.  As part of our continuing mission to provide you with exceptional heart care, we have created designated Provider Care Teams.  These Care Teams include your primary Cardiologist (physician) and Advanced Practice Providers (APPs -  Physician Assistants and Nurse Practitioners) who all work together to provide you with the care you need, when you need it.  Your next appointment:   4 week(s) on Friday December 18 at 9:30 am  The format for your next appointment:   In Person  Provider:   Robbie Lis, PA-C

## 2019-10-13 DIAGNOSIS — L820 Inflamed seborrheic keratosis: Secondary | ICD-10-CM | POA: Diagnosis not present

## 2019-10-13 DIAGNOSIS — L570 Actinic keratosis: Secondary | ICD-10-CM | POA: Diagnosis not present

## 2019-10-13 DIAGNOSIS — L821 Other seborrheic keratosis: Secondary | ICD-10-CM | POA: Diagnosis not present

## 2019-10-13 DIAGNOSIS — Z85828 Personal history of other malignant neoplasm of skin: Secondary | ICD-10-CM | POA: Diagnosis not present

## 2019-10-20 NOTE — Progress Notes (Signed)
Cardiology Office Note    Date:  10/22/2019   ID:  Cathy Larsen, DOB 27-Jan-1926, MRN [id number]  PCP:  Tonia Ghent, MD  Cardiologist:  Dr. Acie Fredrickson  Chief Complaint: 1  Months follow up  History of Present Illness:   Cathy Larsen is a 85 y.o. female with hx of HTN, HLD and DM presents for follow up.   Established care with Dr. Acie Fredrickson 09/2019 for chest pain which was concerning for anginal equivalent. Did not recommended ischemic evaluation due to age and frail. BP was elevated. Lisinopril changed to losartan. Added HCTZ.   Here today for follow-up with grandson.  She did not has any recurrent substernal chest pain.  She occasionally have on and off shoulder pain without chest pain, especially early morning after she walks up.  Consistent with musculoskeletal etiology at it improves with Tylenol.  She walks around the house and garden without recurrent substernal chest pressure/pain.  Denies orthopnea, PND, syncope, lower extremity edema or melena.  Blood pressure has been running normal.  Past Medical History:  Diagnosis Date  . Arthritis    feet and hands  . CAD (coronary artery disease)    cath 1998 with 40% lesion in LAD but no other disease  . Carpal tunnel syndrome   . Diabetes mellitus   . Gout 03/1998  . Hyperlipidemia 08/1997  . Hypertension 09/23/1995  . Skin cancer     Past Surgical History:  Procedure Laterality Date  . BIOPSY BREAST  03/2000   Ductogram right breast w/ biopsy- obstructing lesion benign by path. (Dr. Annamaria Boots)   . BLADDER SURGERY     bladder tack   . CARDIAC CATHETERIZATION  03/1997   40% occulsion lad   . CARPAL TUNNEL RELEASE Right 03/14/2014   Procedure: RIGHT CARPAL TUNNEL RELEASE;  Surgeon: Tennis Must, MD;  Location: [facility];  Service: Orthopedics;  Laterality: Right;  . CATARACT EXTRACTION    . PARTIAL HYSTERECTOMY    . VAGINAL DELIVERY     x 3  . YAG LASER APPLICATION Right [id number]   Procedure: YAG LASER  APPLICATION;  Surgeon: Cathy Che, MD;  Location: [facility];  Service: Ophthalmology;  Laterality: Right;    Current Medications: Prior to Admission medications   Medication Sig Start Date End Date Taking? Authorizing Provider  BOSWELLIA SERRATA PO Take by mouth daily.    [provider]  Flaxseed, Linseed, (FLAX SEED OIL) 1000 MG CAPS Take 1 capsule by mouth daily.    [provider]  Ginger, Zingiber officinalis, (GINGER ROOT) 550 MG CAPS Take 96 mg by mouth 2 (two) times daily. (3 capsules)    [provider]  glucose blood (ONE TOUCH ULTRA TEST) test strip USE DAILY TO CHECK SUGAR.  Dx 11.9. 03/12/19   Tonia Ghent, MD  hydrochlorothiazide (HYDRODIURIL) 25 MG tablet Take 1 tablet (25 mg total) by mouth daily. 09/28/19   Nahser, Wonda Cheng, MD  losartan (COZAAR) 50 MG tablet Take 1 tablet (50 mg total) by mouth daily. 09/28/19   Nahser, Wonda Cheng, MD  Multiple Vitamin (MULTIVITAMIN WITH MINERALS) TABS Take 1 tablet by mouth daily. ALIVE Womens' 50+    [provider]  nitroGLYCERIN (NITROSTAT) 0.4 MG SL tablet Place 1 tablet (0.4 mg total) under the tongue every 5 (five) minutes as needed for chest pain (max 3 tabs in 15 minutes). 09/13/19   Tonia Ghent, MD  Omega-3 Fatty Acids (FISH OIL) 1000 MG CAPS  Take 1,000 mg by mouth daily.     [provider]  potassium chloride (KLOR-CON) 10 MEQ tablet Take 1 tablet (10 mEq total) by mouth daily. 09/28/19   Nahser, Wonda Cheng, MD  Turmeric 500 MG CAPS Take by mouth daily.    [provider]    Allergies:   Statins, Keflex [cephalexin], and Requip [ropinirole hcl]   Social History   Socioeconomic History  . Marital status: Married    Spouse name: Not on file  . Number of children: 3  . Years of education: Not on file  . Highest education level: Not on file  Occupational History  . Occupation: [employer]   Tobacco Use  . Smoking status: Never Smoker  . Smokeless tobacco: Never Used    Substance and Sexual Activity  . Alcohol use: No    Alcohol/week: 0.0 standard drinks  . Drug use: No  . Sexual activity: Not Currently  Other Topics Concern  . Not on file  Social History Narrative   Widowed 10/2012, husband had CVA and dementia   Grandson lives with patient as of 2018   Social Determinants of Health   Financial Resource Strain:   . Difficulty of Paying Living Expenses: Not on file  Food Insecurity:   . Worried About Charity fundraiser in the Last Year: Not on file  . Ran Out of Food in the Last Year: Not on file  Transportation Needs:   . Lack of Transportation (Medical): Not on file  . Lack of Transportation (Non-Medical): Not on file  Physical Activity:   . Days of Exercise per Week: Not on file  . Minutes of Exercise per Session: Not on file  Stress:   . Feeling of Stress : Not on file  Social Connections:   . Frequency of Communication with Friends and Family: Not on file  . Frequency of Social Gatherings with Friends and Family: Not on file  . Attends Religious Services: Not on file  . Active Member of Clubs or Organizations: Not on file  . Attends Archivist Meetings: Not on file  . Marital Status: Not on file     Family History:  The patient's family history includes Cancer in her brother, brother, father, and mother; Hypertension in her sister and sister; Kidney failure in her mother.  ROS:   Please see the history of present illness.    ROS All other systems reviewed and are negative.   PHYSICAL EXAM:   VS:  BP 114/80   Pulse 97   Ht 5\' 4"  (1.626 m)   Wt 180 lb 12.8 oz (82 kg)   SpO2 96%   BMI 31.03 kg/m    GEN: Well nourished, well developed, in no acute distress  HEENT: normal  Neck: no JVD, carotid bruits, or masses Cardiac: RRR; no murmurs, rubs, or gallops,no edema  Respiratory:  clear to auscultation bilaterally, normal work of breathing GI: soft, nontender, nondistended, + BS MS: no deformity or atrophy  Skin:  warm and dry, no rash Neuro:  Alert and Oriented x 3, Strength and sensation are intact Psych: euthymic mood, full affect  Wt Readings from Last 3 Encounters:  10/22/19 180 lb 12.8 oz (82 kg)  09/28/19 182 lb 12.8 oz (82.9 kg)  09/13/19 182 lb 3 oz (82.6 kg)      Studies/Labs Reviewed:   EKG:  EKG is not ordered today.    Recent Labs: 09/02/2019: ALT 12; BUN 14; Creatinine, Ser 1.14; Hemoglobin 13.2;  Platelets 327.0; Potassium 4.2; Sodium 142   Lipid Panel    Component Value Date/Time   CHOL 233 (H) 09/02/2019 0934   TRIG 171.0 (H) 09/02/2019 0934   HDL 36.10 (L) 09/02/2019 0934   CHOLHDL 6 09/02/2019 0934   VLDL 34.2 09/02/2019 0934   LDLCALC 162 (H) 09/02/2019 0934   LDLDIRECT 142.1 01/04/2013 1001    Additional studies/ records that were reviewed today include:   Echocardiogram: 03/2017 Study Conclusions  - Left ventricle: The cavity size was normal. Wall thickness was   increased in a pattern of mild LVH. Systolic function was   vigorous. The estimated ejection fraction was in the range of 65%   to 70%. Wall motion was normal; there were no regional wall   motion abnormalities. Doppler parameters are consistent with   abnormal left ventricular relaxation (grade 1 diastolic   dysfunction). - Aortic valve: There was trivial regurgitation. - Mitral valve: There was mild regurgitation.  Impressions:  - Vigorous LV systolic function; mild diastolic dysfunction; mild   LVH; trace AI; mild MR.       ASSESSMENT & PLAN:    1. Chest pain No recurrence substernal chest pain/pressure since improved blood pressure.  She has some musculoskeletal pain.  She will walks around house and yard without any issue.  2.  Hypertension -Well-controlled on current medication.  No change. Check BMET.  3. HLD -Followed by PCP.    Medication Adjustments/Labs and Tests Ordered: Current medicines are reviewed at length with the patient today.  Concerns regarding medicines  are outlined above.  Medication changes, Labs and Tests ordered today are listed in the Patient Instructions below. Patient Instructions  Medication Instructions:  Your physician recommends that you continue on your current medications as directed. Please refer to the Current Medication list given to you today.  *If you need a refill on your cardiac medications before your next appointment, please call your pharmacy*  Lab Work: TODAY:  BMET  If you have labs (blood work) drawn today and your tests are completely normal, you will receive your results only by: Marland Kitchen MyChart Message (if you have MyChart) OR . A paper copy in the mail If you have any lab test that is abnormal or we need to change your treatment, we will call you to review the results.  Testing/Procedures: None ordered  Follow-Up: At [facility], you and your health needs are our priority.  As part of our continuing mission to provide you with exceptional heart care, we have created designated Provider Care Teams.  These Care Teams include your primary Cardiologist (physician) and Advanced Practice Providers (APPs -  Physician Assistants and Nurse Practitioners) who all work together to provide you with the care you need, when you need it.  Your next appointment:   6 month(s)  The format for your next appointment:   In Person / Virtual  Provider:   You may see No primary care provider on file. or one of the following Advanced Practice Providers on your designated Care Team:    Richardson Dopp, PA-C  [place], [place]  Daune Perch, NP   Other Instructions      Jarrett Soho, [place]  10/22/2019 10:11 AM    [address], [place], [place]  [address] Phone: [phone]; Fax: [phone]

## 2019-10-21 ENCOUNTER — Ambulatory Visit: Payer: Medicare HMO | Admitting: Cardiology

## 2019-10-22 ENCOUNTER — Ambulatory Visit: Payer: Medicare HMO | Admitting: Physician Assistant

## 2019-10-22 ENCOUNTER — Encounter: Payer: Self-pay | Admitting: Physician Assistant

## 2019-10-22 ENCOUNTER — Other Ambulatory Visit: Payer: Self-pay

## 2019-10-22 VITALS — BP 114/80 | HR 97 | Ht 64.0 in | Wt 180.8 lb

## 2019-10-22 DIAGNOSIS — I10 Essential (primary) hypertension: Secondary | ICD-10-CM

## 2019-10-22 DIAGNOSIS — R0789 Other chest pain: Secondary | ICD-10-CM

## 2019-10-22 DIAGNOSIS — Z79899 Other long term (current) drug therapy: Secondary | ICD-10-CM | POA: Diagnosis not present

## 2019-10-22 LAB — BASIC METABOLIC PANEL
BUN/Creatinine Ratio: 12 (ref 12–28)
BUN: 15 mg/dL (ref 10–36)
CO2: 26 mmol/L (ref 20–29)
Calcium: 10.1 mg/dL (ref 8.7–10.3)
Chloride: 98 mmol/L (ref 96–106)
Creatinine, Ser: 1.28 mg/dL — ABNORMAL HIGH (ref 0.57–1.00)
GFR calc Af Amer: 42 mL/min/{1.73_m2} — ABNORMAL LOW (ref >59)
GFR calc non Af Amer: 37 mL/min/{1.73_m2} — ABNORMAL LOW (ref >59)
Glucose: 157 mg/dL — ABNORMAL HIGH (ref 65–99)
Potassium: 4.7 mmol/L (ref 3.5–5.2)
Sodium: 139 mmol/L (ref 134–144)

## 2019-10-22 NOTE — Patient Instructions (Addendum)
Medication Instructions:  Your physician recommends that you continue on your current medications as directed. Please refer to the Current Medication list given to you today.  *If you need a refill on your cardiac medications before your next appointment, please call your pharmacy*  Lab Work: TODAY:  BMET  If you have labs (blood work) drawn today and your tests are completely normal, you will receive your results only by: Marland Kitchen MyChart Message (if you have MyChart) OR . A paper copy in the mail If you have any lab test that is abnormal or we need to change your treatment, we will call you to review the results.  Testing/Procedures: None ordered  Follow-Up: At [facility], you and your health needs are our priority.  As part of our continuing mission to provide you with exceptional heart care, we have created designated Provider Care Teams.  These Care Teams include your primary Cardiologist (physician) and Advanced Practice Providers (APPs -  Physician Assistants and Nurse Practitioners) who all work together to provide you with the care you need, when you need it.  Your next appointment:   6 month(s)  The format for your next appointment:   In Person / Virtual  Provider:   You may see No primary care provider on file. or one of the following Advanced Practice Providers on your designated Care Team:    Richardson Dopp, PA-C  [place], [place]  Daune Perch, NP   Other Instructions

## 2019-10-27 ENCOUNTER — Other Ambulatory Visit: Payer: Self-pay | Admitting: Family Medicine

## 2019-10-27 DIAGNOSIS — I10 Essential (primary) hypertension: Secondary | ICD-10-CM

## 2019-12-03 ENCOUNTER — Telehealth: Payer: Self-pay | Admitting: Family Medicine

## 2019-12-03 NOTE — Telephone Encounter (Signed)
Please call pt. It was reported that she occ had lower BP.  Please verify with patient.  See if she has been lightheaded at all.  If so or if BP <130/<80, then would cut losartan in half.  Please update me as needed.  Thanks.

## 2019-12-03 NOTE — Telephone Encounter (Signed)
Patient states it has run a little low occasionally but not often and she has not had any light-headedness.  Patient states that if her BP runs consistently below 130/80, she will cut the Losartan in half.

## 2019-12-05 NOTE — Telephone Encounter (Signed)
Noted. Thanks.

## 2019-12-30 ENCOUNTER — Other Ambulatory Visit (INDEPENDENT_AMBULATORY_CARE_PROVIDER_SITE_OTHER): Payer: Medicare HMO

## 2019-12-30 ENCOUNTER — Other Ambulatory Visit: Payer: Self-pay

## 2019-12-30 DIAGNOSIS — I10 Essential (primary) hypertension: Secondary | ICD-10-CM

## 2019-12-30 LAB — BASIC METABOLIC PANEL
BUN: 15 mg/dL (ref 6–23)
CO2: 32 mEq/L (ref 19–32)
Calcium: 9.7 mg/dL (ref 8.4–10.5)
Chloride: 99 mEq/L (ref 96–112)
Creatinine, Ser: 1.18 mg/dL (ref 0.40–1.20)
GFR: 42.89 mL/min — ABNORMAL LOW (ref >60.00)
Glucose, Bld: 216 mg/dL — ABNORMAL HIGH (ref 70–99)
Potassium: 4.1 mEq/L (ref 3.5–5.1)
Sodium: 138 mEq/L (ref 135–145)

## 2020-03-01 DIAGNOSIS — R69 Illness, unspecified: Secondary | ICD-10-CM | POA: Diagnosis not present

## 2020-03-13 ENCOUNTER — Ambulatory Visit (INDEPENDENT_AMBULATORY_CARE_PROVIDER_SITE_OTHER): Payer: Medicare HMO | Admitting: Family Medicine

## 2020-03-13 ENCOUNTER — Other Ambulatory Visit: Payer: Self-pay

## 2020-03-13 ENCOUNTER — Encounter: Payer: Self-pay | Admitting: Family Medicine

## 2020-03-13 VITALS — BP 120/78 | HR 96 | Temp 97.2°F | Ht 64.0 in | Wt 188.4 lb

## 2020-03-13 DIAGNOSIS — I10 Essential (primary) hypertension: Secondary | ICD-10-CM

## 2020-03-13 DIAGNOSIS — E119 Type 2 diabetes mellitus without complications: Secondary | ICD-10-CM | POA: Diagnosis not present

## 2020-03-13 LAB — POCT GLYCOSYLATED HEMOGLOBIN (HGB A1C): Hemoglobin A1C: 7.3 % — AB (ref 4.0–5.6)

## 2020-03-13 MED ORDER — LOSARTAN POTASSIUM 50 MG PO TABS
25.0000 mg | ORAL_TABLET | Freq: Every day | ORAL | Status: DC
Start: 2020-03-13 — End: 2020-09-14

## 2020-03-13 NOTE — Patient Instructions (Signed)
Your A1c is fine.   Recheck in about 6 months at a physical with labs ahead of time if possible.  Take care.  Glad to see you.

## 2020-03-13 NOTE — Progress Notes (Signed)
This visit occurred during the SARS-CoV-2 public health emergency.  Safety protocols were in place, including screening questions prior to the visit, additional usage of staff PPE, and extensive cleaning of exam room while observing appropriate contact time as indicated for disinfecting solutions.  She had cut back to 25mg  losartan and did well with that dose.  Ankle swelling improved in the meantime.   She had her covid vaccine.    Diabetes:  No meds.   Hypoglycemic episodes: no Hyperglycemic episodes: no Feet problems: no Blood Sugars averaging: ~170 on occ checks, occ lower.   eye exam within last year: d/w pt.  She is going to call about an appointment.   A1c 7.3.   She is working on her diet and using a sitdown exercise bike.  Meds, vitals, and allergies reviewed.   ROS: Per HPI unless specifically indicated in ROS section   GEN: nad, alert and oriented HEENT: ncat NECK: supple w/o LA CV: rrr. PULM: ctab, no inc wob ABD: soft, +bs EXT: no edema SKIN: Well-perfused.

## 2020-03-15 NOTE — Assessment & Plan Note (Signed)
No change in medications.  A1c 7.3.  Continue work on diet and exercise.  Recheck periodically.  She agrees.

## 2020-03-15 NOTE — Assessment & Plan Note (Signed)
Continue losartan 25 mg a day and hydrochlorothiazide 25 mg a day.

## 2020-03-22 DIAGNOSIS — R69 Illness, unspecified: Secondary | ICD-10-CM | POA: Diagnosis not present

## 2020-04-12 DIAGNOSIS — C44329 Squamous cell carcinoma of skin of other parts of face: Secondary | ICD-10-CM | POA: Diagnosis not present

## 2020-04-12 DIAGNOSIS — L821 Other seborrheic keratosis: Secondary | ICD-10-CM | POA: Diagnosis not present

## 2020-04-12 DIAGNOSIS — L720 Epidermal cyst: Secondary | ICD-10-CM | POA: Diagnosis not present

## 2020-04-12 DIAGNOSIS — L570 Actinic keratosis: Secondary | ICD-10-CM | POA: Diagnosis not present

## 2020-04-12 DIAGNOSIS — D485 Neoplasm of uncertain behavior of skin: Secondary | ICD-10-CM | POA: Diagnosis not present

## 2020-04-12 DIAGNOSIS — Z85828 Personal history of other malignant neoplasm of skin: Secondary | ICD-10-CM | POA: Diagnosis not present

## 2020-04-12 DIAGNOSIS — D2371 Other benign neoplasm of skin of right lower limb, including hip: Secondary | ICD-10-CM | POA: Diagnosis not present

## 2020-05-12 DIAGNOSIS — Z9841 Cataract extraction status, right eye: Secondary | ICD-10-CM | POA: Diagnosis not present

## 2020-05-12 DIAGNOSIS — H5213 Myopia, bilateral: Secondary | ICD-10-CM | POA: Diagnosis not present

## 2020-05-12 DIAGNOSIS — H16223 Keratoconjunctivitis sicca, not specified as Sjogren's, bilateral: Secondary | ICD-10-CM | POA: Diagnosis not present

## 2020-05-12 DIAGNOSIS — E119 Type 2 diabetes mellitus without complications: Secondary | ICD-10-CM | POA: Diagnosis not present

## 2020-05-12 DIAGNOSIS — H04123 Dry eye syndrome of bilateral lacrimal glands: Secondary | ICD-10-CM | POA: Diagnosis not present

## 2020-05-12 DIAGNOSIS — Z9842 Cataract extraction status, left eye: Secondary | ICD-10-CM | POA: Diagnosis not present

## 2020-05-12 LAB — HM DIABETES EYE EXAM

## 2020-06-05 ENCOUNTER — Encounter: Payer: Self-pay | Admitting: Family Medicine

## 2020-06-05 DIAGNOSIS — R69 Illness, unspecified: Secondary | ICD-10-CM | POA: Diagnosis not present

## 2020-07-17 DIAGNOSIS — C44712 Basal cell carcinoma of skin of right lower limb, including hip: Secondary | ICD-10-CM | POA: Diagnosis not present

## 2020-07-17 DIAGNOSIS — Z85828 Personal history of other malignant neoplasm of skin: Secondary | ICD-10-CM | POA: Diagnosis not present

## 2020-07-17 DIAGNOSIS — D485 Neoplasm of uncertain behavior of skin: Secondary | ICD-10-CM | POA: Diagnosis not present

## 2020-07-17 DIAGNOSIS — L720 Epidermal cyst: Secondary | ICD-10-CM | POA: Diagnosis not present

## 2020-07-17 DIAGNOSIS — C44612 Basal cell carcinoma of skin of right upper limb, including shoulder: Secondary | ICD-10-CM | POA: Diagnosis not present

## 2020-07-17 DIAGNOSIS — C44329 Squamous cell carcinoma of skin of other parts of face: Secondary | ICD-10-CM | POA: Diagnosis not present

## 2020-07-17 DIAGNOSIS — L570 Actinic keratosis: Secondary | ICD-10-CM | POA: Diagnosis not present

## 2020-08-27 ENCOUNTER — Other Ambulatory Visit: Payer: Self-pay | Admitting: Family Medicine

## 2020-08-27 DIAGNOSIS — E119 Type 2 diabetes mellitus without complications: Secondary | ICD-10-CM

## 2020-09-07 ENCOUNTER — Other Ambulatory Visit (INDEPENDENT_AMBULATORY_CARE_PROVIDER_SITE_OTHER): Payer: Medicare HMO

## 2020-09-07 ENCOUNTER — Other Ambulatory Visit: Payer: Self-pay

## 2020-09-07 DIAGNOSIS — E119 Type 2 diabetes mellitus without complications: Secondary | ICD-10-CM

## 2020-09-07 LAB — CBC WITH DIFFERENTIAL/PLATELET
Basophils Absolute: 0 10*3/uL (ref 0.0–0.1)
Basophils Relative: 0.3 % (ref 0.0–3.0)
Eosinophils Absolute: 0.2 10*3/uL (ref 0.0–0.7)
Eosinophils Relative: 2.9 % (ref 0.0–5.0)
HCT: 39.8 % (ref 36.0–46.0)
Hemoglobin: 13.4 g/dL (ref 12.0–15.0)
Lymphocytes Relative: 25.9 % (ref 12.0–46.0)
Lymphs Abs: 2 10*3/uL (ref 0.7–4.0)
MCHC: 33.7 g/dL (ref 30.0–36.0)
MCV: 87.1 fl (ref 78.0–100.0)
Monocytes Absolute: 0.7 10*3/uL (ref 0.1–1.0)
Monocytes Relative: 9.1 % (ref 3.0–12.0)
Neutro Abs: 4.7 10*3/uL (ref 1.4–7.7)
Neutrophils Relative %: 61.8 % (ref 43.0–77.0)
Platelets: 341 10*3/uL (ref 150.0–400.0)
RBC: 4.57 Mil/uL (ref 3.87–5.11)
RDW: 13.3 % (ref 11.5–15.5)
WBC: 7.7 10*3/uL (ref 4.0–10.5)

## 2020-09-07 LAB — COMPREHENSIVE METABOLIC PANEL
ALT: 12 U/L (ref 0–35)
AST: 18 U/L (ref 0–37)
Albumin: 4.2 g/dL (ref 3.5–5.2)
Alkaline Phosphatase: 53 U/L (ref 39–117)
BUN: 18 mg/dL (ref 6–23)
CO2: 32 mEq/L (ref 19–32)
Calcium: 9.5 mg/dL (ref 8.4–10.5)
Chloride: 99 mEq/L (ref 96–112)
Creatinine, Ser: 1.21 mg/dL — ABNORMAL HIGH (ref 0.40–1.20)
GFR: 39 mL/min — ABNORMAL LOW (ref >60.00)
Glucose, Bld: 142 mg/dL — ABNORMAL HIGH (ref 70–99)
Potassium: 4.1 mEq/L (ref 3.5–5.1)
Sodium: 136 mEq/L (ref 135–145)
Total Bilirubin: 0.5 mg/dL (ref 0.2–1.2)
Total Protein: 6.8 g/dL (ref 6.0–8.3)

## 2020-09-07 LAB — URIC ACID: Uric Acid, Serum: 5.8 mg/dL (ref 2.4–7.0)

## 2020-09-07 LAB — HEMOGLOBIN A1C: Hgb A1c MFr Bld: 7.7 % — ABNORMAL HIGH (ref 4.6–6.5)

## 2020-09-07 LAB — LIPID PANEL
Cholesterol: 225 mg/dL — ABNORMAL HIGH (ref 0–200)
HDL: 40 mg/dL (ref >39.00)
LDL Cholesterol: 154 mg/dL — ABNORMAL HIGH (ref 0–99)
NonHDL: 185.42
Total CHOL/HDL Ratio: 6
Triglycerides: 159 mg/dL — ABNORMAL HIGH (ref 0.0–149.0)
VLDL: 31.8 mg/dL (ref 0.0–40.0)

## 2020-09-14 ENCOUNTER — Ambulatory Visit (INDEPENDENT_AMBULATORY_CARE_PROVIDER_SITE_OTHER): Payer: Medicare HMO | Admitting: Family Medicine

## 2020-09-14 ENCOUNTER — Other Ambulatory Visit: Payer: Self-pay

## 2020-09-14 ENCOUNTER — Encounter: Payer: Self-pay | Admitting: Family Medicine

## 2020-09-14 VITALS — BP 160/90 | HR 88 | Temp 97.6°F | Ht 63.0 in | Wt 187.1 lb

## 2020-09-14 DIAGNOSIS — E119 Type 2 diabetes mellitus without complications: Secondary | ICD-10-CM | POA: Diagnosis not present

## 2020-09-14 DIAGNOSIS — Z0000 Encounter for general adult medical examination without abnormal findings: Secondary | ICD-10-CM | POA: Diagnosis not present

## 2020-09-14 DIAGNOSIS — Z7189 Other specified counseling: Secondary | ICD-10-CM

## 2020-09-14 DIAGNOSIS — Z23 Encounter for immunization: Secondary | ICD-10-CM | POA: Diagnosis not present

## 2020-09-14 DIAGNOSIS — I10 Essential (primary) hypertension: Secondary | ICD-10-CM | POA: Diagnosis not present

## 2020-09-14 MED ORDER — LOSARTAN POTASSIUM 50 MG PO TABS
50.0000 mg | ORAL_TABLET | Freq: Every day | ORAL | 3 refills | Status: DC
Start: 2020-09-14 — End: 2021-09-20

## 2020-09-14 MED ORDER — HYDROCHLOROTHIAZIDE 25 MG PO TABS
25.0000 mg | ORAL_TABLET | Freq: Every day | ORAL | 3 refills | Status: DC
Start: 2020-09-14 — End: 2021-05-03

## 2020-09-14 MED ORDER — POTASSIUM CHLORIDE ER 10 MEQ PO TBCR
10.0000 meq | EXTENDED_RELEASE_TABLET | Freq: Every day | ORAL | 3 refills | Status: DC
Start: 2020-09-14 — End: 2021-09-20

## 2020-09-14 NOTE — Progress Notes (Signed)
This visit occurred during the SARS-CoV-2 public health emergency.  Safety protocols were in place, including screening questions prior to the visit, additional usage of staff PPE, and extensive cleaning of exam room while observing appropriate contact time as indicated for disinfecting solutions.  I have personally reviewed the Medicare Annual Wellness questionnaire and have noted 1. The patient's medical and social history 2. Their use of alcohol, tobacco or illicit drugs 3. Their current medications and supplements 4. The patient's functional ability including ADL's, fall risks, home safety risks and hearing or visual             impairment. 5. Diet and physical activities 6. Evidence for depression or mood disorders  The patients weight, height, BMI have been recorded in the chart and visual acuity is per eye clinic.  I have made referrals, counseling and provided education to the patient based review of the above and I have provided the pt with a written personalized care plan for preventive services.  Provider list updated- see scanned forms.  Routine anticipatory guidance given to patient.  See health maintenance. The possibility exists that previously documented standard health maintenance information may have been brought forward from a previous encounter into this note.  If needed, that same information has been updated to reflect the current situation based on today's encounter.    Flu today.  Shingles 2009, discussed with patient PNA up-to-date Tetanus 2009, discussed with patient. covid vaccine 2021 Colon cancer screening not due given her age. Breast cancer screening declined by patient.  Discussed. Bone density test screening declined by patient.  Discussed. Advance directive- grandson TJ Makenzie, [place]) designated if patient were incapacitated Cognitive function addressed- see scanned forms- and if abnormal then additional documentation follows.   Hypertension:    Using medication without problems or lightheadedness: yes Chest pain with exertion:no Edema: improved with HCTZ, prev with L lateral ankle edema.   Short of breath:no Still on hydrochlorothiazide and losartan.  She occasionally had some lower blood pressures and she had decreased her losartan.  She subsequently increased her dose back to 50 mg a day and had felt well the meantime (this was after her blood pressure was noted to be higher on home checks).  She did not have exertional chest pain.  She did occasionally have some nocturnal chest discomfort at rest, usually between 3 and 5 AM.  She did not have exertional symptoms during the day.  In the meantime her symptoms (which sound like they could have been reflux related) are resolved.  Routine cautions given to patient.  DM2.  No meds.  Labs d/w pt.  Statin intolerant.  Sugar this AM was 143.  Some partial numbness in the feet, at baseline.    PMH and SH reviewed  Meds, vitals, and allergies reviewed.   ROS: Per HPI.  Unless specifically indicated otherwise in HPI, the patient denies:  General: fever. Eyes: acute vision changes ENT: sore throat Cardiovascular: chest pain Respiratory: SOB GI: vomiting GU: dysuria Musculoskeletal: acute back pain Derm: acute rash Neuro: acute motor dysfunction Psych: worsening mood Endocrine: polydipsia Heme: bleeding Allergy: hayfever  GEN: nad, alert and oriented HEENT: ncat NECK: supple w/o LA CV: rrr. PULM: ctab, no inc wob ABD: soft, +bs EXT: no edema SKIN: no acute rash  Diabetic foot exam: Normal inspection No skin breakdown No calluses  Normal DP pulses Normal sensation to light touch but decreased sensation to monofilament Nails normal

## 2020-09-14 NOTE — Patient Instructions (Signed)
Don't change your meds for now.  I sent your refills.  Plan on recheck in about 6 months with A1c at the visit.  You don't have to fast.  Take care.  Glad to see you.

## 2020-09-17 NOTE — Assessment & Plan Note (Signed)
Her blood pressure is controlled now and I think it makes sense to continue as is.  She is not having lightheadedness.  She is not having any chest pain.  She never had exertional pain.  She had some nocturnal discomfort at rest that could have been GERD related but this has resolved in the meantime.  I think it makes sense to continue as is with losartan and hydrochlorothiazide for now and have her update me as needed.  She agrees with plan.

## 2020-09-17 NOTE — Assessment & Plan Note (Signed)
Advance directive- grandson TJ Nikia, [place]) designated if patient were incapacitated

## 2020-09-17 NOTE — Assessment & Plan Note (Signed)
No meds.  Labs d/w pt.  Statin intolerant.  Sugar this AM was 143.  Some partial numbness in the feet, at baseline.   Foot care discussed with patient.  We can recheck periodically.  No change in meds at this point.  She agrees.

## 2020-09-17 NOTE — Assessment & Plan Note (Signed)
Flu today.  Shingles 2009, discussed with patient PNA up-to-date Tetanus 2009, discussed with patient. covid vaccine 2021 Colon cancer screening not due given her age. Breast cancer screening declined by patient.  Discussed. Bone density test screening declined by patient.  Discussed. Advance directive- grandson TJ Liya, [place]) designated if patient were incapacitated Cognitive function addressed- see scanned forms- and if abnormal then additional documentation follows.

## 2020-09-20 DIAGNOSIS — R69 Illness, unspecified: Secondary | ICD-10-CM | POA: Diagnosis not present

## 2020-10-07 ENCOUNTER — Other Ambulatory Visit: Payer: Self-pay | Admitting: Cardiovascular Disease

## 2020-10-12 DIAGNOSIS — L570 Actinic keratosis: Secondary | ICD-10-CM | POA: Diagnosis not present

## 2020-10-12 DIAGNOSIS — L821 Other seborrheic keratosis: Secondary | ICD-10-CM | POA: Diagnosis not present

## 2020-10-12 DIAGNOSIS — L814 Other melanin hyperpigmentation: Secondary | ICD-10-CM | POA: Diagnosis not present

## 2020-10-12 DIAGNOSIS — Z85828 Personal history of other malignant neoplasm of skin: Secondary | ICD-10-CM | POA: Diagnosis not present

## 2020-10-12 DIAGNOSIS — D225 Melanocytic nevi of trunk: Secondary | ICD-10-CM | POA: Diagnosis not present

## 2021-03-16 ENCOUNTER — Ambulatory Visit (INDEPENDENT_AMBULATORY_CARE_PROVIDER_SITE_OTHER): Payer: Medicare HMO | Admitting: Family Medicine

## 2021-03-16 ENCOUNTER — Other Ambulatory Visit: Payer: Self-pay

## 2021-03-16 ENCOUNTER — Encounter: Payer: Self-pay | Admitting: Family Medicine

## 2021-03-16 VITALS — BP 136/70 | HR 61 | Temp 96.7°F | Wt 193.6 lb

## 2021-03-16 DIAGNOSIS — E119 Type 2 diabetes mellitus without complications: Secondary | ICD-10-CM

## 2021-03-16 LAB — POCT GLYCOSYLATED HEMOGLOBIN (HGB A1C): Hemoglobin A1C: 7.8 % — AB (ref 4.0–5.6)

## 2021-03-16 MED ORDER — GLUCOSE BLOOD VI STRP
ORAL_STRIP | 3 refills | Status: DC
Start: 2021-03-16 — End: 2022-07-15

## 2021-03-16 NOTE — Progress Notes (Signed)
This visit occurred during the SARS-CoV-2 public health emergency.  Safety protocols were in place, including screening questions prior to the visit, additional usage of staff PPE, and extensive cleaning of exam room while observing appropriate contact time as indicated for disinfecting solutions.  Diabetes:  No meds.  Hypoglycemic episodes: no Hyperglycemic episodes: no Feet problems: no Blood Sugars averaging: usually ~130, occ higher eye exam within last year: yes, d/w pt.  She'll call about follow up.  A1c done at [facility].  Stable at 7.8.    No CP.  She has some stable mild SOB with sig exertion, still walking with cane and using a sit down exercise pedal/bike.  She is still gardening (cucumbers, tomatoes, and peppers).  We agreed that she would update me if she noted any changes.  Meds, vitals, and allergies reviewed.   ROS: Per HPI unless specifically indicated in ROS section   GEN: nad, alert and oriented HEENT: ncat NECK: supple w/o LA CV: rrr. PULM: ctab, no inc wob ABD: soft, +bs EXT: no edema SKIN: no acute rash

## 2021-03-16 NOTE — Patient Instructions (Signed)
Don't change your meds for now.  Recheck in about 6 months at a yearly visit. We can do labs at the visit if needed.   Take care.  Glad to see you.

## 2021-03-18 NOTE — Assessment & Plan Note (Signed)
No change in medications at this point.  A1c is stable.  Continue diet. Recheck in about 6 months at a yearly visit. We can do labs at the visit if needed.  She agrees with plan.

## 2021-04-23 DIAGNOSIS — L821 Other seborrheic keratosis: Secondary | ICD-10-CM | POA: Diagnosis not present

## 2021-04-23 DIAGNOSIS — D485 Neoplasm of uncertain behavior of skin: Secondary | ICD-10-CM | POA: Diagnosis not present

## 2021-04-23 DIAGNOSIS — D0472 Carcinoma in situ of skin of left lower limb, including hip: Secondary | ICD-10-CM | POA: Diagnosis not present

## 2021-04-23 DIAGNOSIS — L570 Actinic keratosis: Secondary | ICD-10-CM | POA: Diagnosis not present

## 2021-04-23 DIAGNOSIS — D0462 Carcinoma in situ of skin of left upper limb, including shoulder: Secondary | ICD-10-CM | POA: Diagnosis not present

## 2021-05-03 ENCOUNTER — Telehealth: Payer: Self-pay | Admitting: Family Medicine

## 2021-05-03 MED ORDER — HYDROCHLOROTHIAZIDE 25 MG PO TABS: 25.0000 mg | ORAL_TABLET | Freq: Every day | ORAL | 3 refills | Status: DC

## 2021-05-03 NOTE — Telephone Encounter (Signed)
Mrs. Cathy Larsen grandson tj called in and wanted to know if Dr. Damita Dunnings can write a script to [facility] due to she has been out of the medication for a couple of days and she is now having ankle swelling.   LAST APPOINTMENT DATE: 03/16/2021   NEXT APPOINTMENT DATE:@Visit  date not found  MEDICATION: hydrochlorothiazide   [facility] [address]  Let patient know to contact pharmacy at the end of the day to make sure medication is ready.  Please notify patient to allow 48-72 hours to process  Encourage patient to contact the pharmacy for refills or they can request refills through [place]:   LAST REFILL:  QTY:  REFILL DATE:    OTHER COMMENTS:    Okay for refill?  Please advise

## 2021-05-03 NOTE — Addendum Note (Signed)
Addended by: Sherrilee Gilles B on: 05/03/2021 04:29 PM   Modules accepted: Orders

## 2021-05-03 NOTE — Telephone Encounter (Signed)
Called and spoke with patients grandson. Swelling isnt too bad and patient has not been in pain; BP was up in the 160s but couldn't remember the actual number today. I advised TJ to keep a check on her BP and the swelling and if either gets worse to let us know. Refills have been sent to the pharmacy.

## 2021-05-11 DIAGNOSIS — H52223 Regular astigmatism, bilateral: Secondary | ICD-10-CM | POA: Diagnosis not present

## 2021-05-11 DIAGNOSIS — Z9841 Cataract extraction status, right eye: Secondary | ICD-10-CM | POA: Diagnosis not present

## 2021-05-11 DIAGNOSIS — H5213 Myopia, bilateral: Secondary | ICD-10-CM | POA: Diagnosis not present

## 2021-05-11 DIAGNOSIS — H04123 Dry eye syndrome of bilateral lacrimal glands: Secondary | ICD-10-CM | POA: Diagnosis not present

## 2021-05-11 DIAGNOSIS — E119 Type 2 diabetes mellitus without complications: Secondary | ICD-10-CM | POA: Diagnosis not present

## 2021-05-11 DIAGNOSIS — H16223 Keratoconjunctivitis sicca, not specified as Sjogren's, bilateral: Secondary | ICD-10-CM | POA: Diagnosis not present

## 2021-05-11 DIAGNOSIS — Z9842 Cataract extraction status, left eye: Secondary | ICD-10-CM | POA: Diagnosis not present

## 2021-05-11 LAB — HM DIABETES EYE EXAM

## 2021-05-20 ENCOUNTER — Ambulatory Visit (INDEPENDENT_AMBULATORY_CARE_PROVIDER_SITE_OTHER): Payer: Medicare HMO

## 2021-05-20 ENCOUNTER — Encounter (HOSPITAL_BASED_OUTPATIENT_CLINIC_OR_DEPARTMENT_OTHER): Payer: Self-pay

## 2021-05-20 ENCOUNTER — Ambulatory Visit
Admission: RE | Admit: 2021-05-20 | Discharge: 2021-05-20 | Disposition: A | Discharge status: Home / Self Care | Payer: Medicare HMO | Source: Ambulatory Visit | Attending: Family Medicine | Admitting: Family Medicine

## 2021-05-20 ENCOUNTER — Other Ambulatory Visit: Payer: Self-pay

## 2021-05-20 ENCOUNTER — Emergency Department (HOSPITAL_BASED_OUTPATIENT_CLINIC_OR_DEPARTMENT_OTHER): Payer: Medicare HMO

## 2021-05-20 ENCOUNTER — Emergency Department (HOSPITAL_BASED_OUTPATIENT_CLINIC_OR_DEPARTMENT_OTHER)
Admission: EM | Admit: 2021-05-20 | Discharge: 2021-05-20 | Disposition: A | Discharge status: Home / Self Care | Payer: Medicare HMO | Attending: Emergency Medicine | Admitting: Emergency Medicine

## 2021-05-20 VITALS — BP 201/105 | HR 95 | Temp 98.4°F | Resp 17 | Wt 182.6 lb

## 2021-05-20 DIAGNOSIS — I2510 Atherosclerotic heart disease of native coronary artery without angina pectoris: Secondary | ICD-10-CM | POA: Insufficient documentation

## 2021-05-20 DIAGNOSIS — I10 Essential (primary) hypertension: Secondary | ICD-10-CM | POA: Insufficient documentation

## 2021-05-20 DIAGNOSIS — R1032 Left lower quadrant pain: Secondary | ICD-10-CM | POA: Diagnosis not present

## 2021-05-20 DIAGNOSIS — E785 Hyperlipidemia, unspecified: Secondary | ICD-10-CM | POA: Insufficient documentation

## 2021-05-20 DIAGNOSIS — R102 Pelvic and perineal pain: Secondary | ICD-10-CM | POA: Insufficient documentation

## 2021-05-20 DIAGNOSIS — M1611 Unilateral primary osteoarthritis, right hip: Secondary | ICD-10-CM | POA: Diagnosis not present

## 2021-05-20 DIAGNOSIS — E1169 Type 2 diabetes mellitus with other specified complication: Secondary | ICD-10-CM | POA: Diagnosis not present

## 2021-05-20 DIAGNOSIS — S79929A Unspecified injury of unspecified thigh, initial encounter: Secondary | ICD-10-CM | POA: Diagnosis not present

## 2021-05-20 DIAGNOSIS — Z79899 Other long term (current) drug therapy: Secondary | ICD-10-CM | POA: Insufficient documentation

## 2021-05-20 DIAGNOSIS — S76219A Strain of adductor muscle, fascia and tendon of unspecified thigh, initial encounter: Secondary | ICD-10-CM | POA: Insufficient documentation

## 2021-05-20 DIAGNOSIS — M1612 Unilateral primary osteoarthritis, left hip: Secondary | ICD-10-CM | POA: Diagnosis not present

## 2021-05-20 DIAGNOSIS — X58XXXA Exposure to other specified factors, initial encounter: Secondary | ICD-10-CM | POA: Insufficient documentation

## 2021-05-20 DIAGNOSIS — M25552 Pain in left hip: Secondary | ICD-10-CM

## 2021-05-20 DIAGNOSIS — S39011A Strain of muscle, fascia and tendon of abdomen, initial encounter: Secondary | ICD-10-CM | POA: Diagnosis not present

## 2021-05-20 DIAGNOSIS — Z85828 Personal history of other malignant neoplasm of skin: Secondary | ICD-10-CM | POA: Diagnosis not present

## 2021-05-20 LAB — POCT URINALYSIS DIP (MANUAL ENTRY)
Bilirubin, UA: NEGATIVE
Glucose, UA: 100 mg/dL — AB
Ketones, POC UA: NEGATIVE mg/dL
Leukocytes, UA: NEGATIVE
Nitrite, UA: NEGATIVE
Protein Ur, POC: NEGATIVE mg/dL
Spec Grav, UA: 1.015 (ref 1.010–1.025)
Urobilinogen, UA: 0.2 E.U./dL
pH, UA: 6.5 (ref 5.0–8.0)

## 2021-05-20 NOTE — ED Notes (Signed)
Pt little a one person assistance getting out of bed but Pt did not need any assistance walking. Pt does walk with cane. Pt walked to the Bathroom and back with no problems.

## 2021-05-20 NOTE — ED Provider Notes (Signed)
[facility] Provider Note   CSN: [id number] Arrival date & time: 05/20/21  1557     History Chief Complaint  Patient presents with   Abdominal Pain    Cathy Larsen is a 87 y.o. female.  HPI 87 year old female presents with pelvic pain when she walks.  Ongoing for 2 or 3 days.  A few days ago she got out of a truck that her family member was driving and it was pretty high and she thinks he might of twinge something.  Now she has pain only when she ambulates.  If she is at rest she is fine.  No pain with range of motion of her hips.  She is able to walk but is now requiring a cane due to the pain.  Family gave some Tylenol prior to arrival and it has helped.  No urinary symptoms or diarrhea.  Past Medical History:  Diagnosis Date   Arthritis    feet and hands   CAD (coronary artery disease)    cath 1998 with 40% lesion in LAD but no other disease   Carpal tunnel syndrome    Diabetes mellitus    Gout 03/1998   Hyperlipidemia 08/1997   Hypertension 09/23/1995   Skin cancer     Patient Active Problem List   Diagnosis Date Noted   Atypical chest pain 09/15/2019   Dysphasia 11/08/2018   Cough 11/08/2018   Healthcare maintenance 09/07/2017   Other social stressor 09/07/2017   Rash 09/07/2017   Murmur 03/05/2017   Carpal tunnel syndrome    Advance care planning 08/31/2015   Knee pain, right 09/20/2014   Medicare annual wellness visit, subsequent 01/21/2014   Cyst of finger 08/05/2013   RLS (restless legs syndrome) 01/12/2013   History of TIA (transient ischemic attack) 07/04/2012   Diabetes mellitus without complication ([place]) 17/51/0258   TIA (transient ischemic attack) 07/04/2012   Vertigo 05/19/2012   FATIGUE 12/25/2009   HEEL PAIN, BILATERAL 06/23/2008   HLD (hyperlipidemia) 05/13/2007   GOUT 05/13/2007   Essential hypertension 05/13/2007   CAD 05/13/2007   ANOMALY, CONGENITAL, BREAST NEC 05/13/2007   HX, PERSONAL, ARTHRITIS  05/13/2007   FIBROCYSTIC BREAST DISEASE, HX OF 05/13/2007    Past Surgical History:  Procedure Laterality Date   BIOPSY BREAST  03/2000   Ductogram right breast w/ biopsy- obstructing lesion benign by path. (Dr. Annamaria Boots)    BLADDER SURGERY     bladder tack    CARDIAC CATHETERIZATION  03/1997   40% occulsion lad    CARPAL TUNNEL RELEASE Right 03/14/2014   Procedure: RIGHT CARPAL TUNNEL RELEASE;  Surgeon: Tennis Must, MD;  Location: [facility] [facility];  Service: Orthopedics;  Laterality: Right;   CATARACT EXTRACTION     PARTIAL HYSTERECTOMY     VAGINAL DELIVERY     x 3   YAG LASER APPLICATION Right 03/05/7781   Procedure: YAG LASER APPLICATION;  Surgeon: Williams Che, MD;  Location: [facility];  Service: Ophthalmology;  Laterality: Right;     OB History   No obstetric history on file.     Family History  Problem Relation Age of Onset   Cancer Mother        breast    Kidney failure Mother    Cancer Father        colon    Cancer Brother        bladder    Cancer Brother        lung  Hypertension Sister    Hypertension Sister    Diabetes Neg Hx    Stroke Neg Hx     Social History   Tobacco Use   Smoking status: Never   Smokeless tobacco: Never  Vaping Use   Vaping Use: Never used  Substance Use Topics   Alcohol use: No    Alcohol/week: 0.0 standard drinks   Drug use: No    Home Medications Prior to Admission medications   Medication Sig Start Date End Date Taking? Authorizing Provider  BOSWELLIA SERRATA PO Take by mouth daily.    [provider]  Flaxseed, Linseed, (FLAX SEED OIL) 1000 MG CAPS Take 1 capsule by mouth daily.    [provider]  Ginger, Zingiber officinalis, (GINGER ROOT) 550 MG CAPS Take 96 mg by mouth 2 (two) times daily. (3 capsules)    [provider]  glucose blood (ONE TOUCH ULTRA TEST) test strip USE DAILY TO CHECK SUGAR.  Dx 11.9. 03/16/21   Tonia Ghent, MD  hydrochlorothiazide (HYDRODIURIL) 25 MG  tablet Take 1 tablet (25 mg total) by mouth daily. 05/03/21   Tonia Ghent, MD  losartan (COZAAR) 50 MG tablet Take 1 tablet (50 mg total) by mouth daily. 09/14/20   Tonia Ghent, MD  Multiple Vitamin (MULTIVITAMIN WITH MINERALS) TABS Take 1 tablet by mouth daily. ALIVE Womens' 50+    [provider]  nitroGLYCERIN (NITROSTAT) 0.4 MG SL tablet Place 1 tablet (0.4 mg total) under the tongue every 5 (five) minutes as needed for chest pain (max 3 tabs in 15 minutes). 09/13/19   Tonia Ghent, MD  Omega-3 Fatty Acids (FISH OIL) 1000 MG CAPS Take 1,000 mg by mouth daily.     [provider]  potassium chloride (KLOR-CON) 10 MEQ tablet Take 1 tablet (10 mEq total) by mouth daily. 09/14/20   Tonia Ghent, MD  Turmeric 500 MG CAPS Take by mouth daily.    [provider]    Allergies    Statins, Keflex [cephalexin], and Requip [ropinirole hcl]  Review of Systems   Review of Systems  Gastrointestinal:  Negative for abdominal pain and diarrhea.  Genitourinary:  Negative for dysuria.  Musculoskeletal:  Negative for arthralgias and back pain.  All other systems reviewed and are negative.  Physical Exam Updated Vital Signs BP (!) 158/74 (BP Location: Right Arm)   Pulse 84   Temp 98.6 F (37 C) (Oral)   Resp 16   SpO2 98%   Physical Exam Vitals and nursing note reviewed.  Constitutional:      Appearance: She is well-developed.  HENT:     Head: Normocephalic and atraumatic.     Right Ear: External ear normal.     Left Ear: External ear normal.     Nose: Nose normal.  Eyes:     General:        Right eye: No discharge.        Left eye: No discharge.  Cardiovascular:     Rate and Rhythm: Normal rate and regular rhythm.     Heart sounds: Normal heart sounds.  Pulmonary:     Effort: Pulmonary effort is normal.     Breath sounds: Normal breath sounds.  Abdominal:     Palpations: Abdomen is soft.     Tenderness: There is no abdominal tenderness.   Musculoskeletal:     Right hip: No deformity or tenderness. Normal range of motion.     Left hip: No deformity or  tenderness. Normal range of motion.  Skin:    General: Skin is warm and dry.  Neurological:     Mental Status: She is alert.  Psychiatric:        Mood and Affect: Mood is not anxious.    ED Results / Procedures / Treatments   Labs (all labs ordered are listed, but only abnormal results are displayed) Labs Reviewed - No data to display  EKG None  Radiology CT PELVIS WO CONTRAST  Result Date: 05/20/2021 CLINICAL DATA:  Pelvic pain. EXAM: CT PELVIS WITHOUT CONTRAST TECHNIQUE: Multidetector CT imaging of the pelvis was performed following the standard protocol without intravenous contrast. COMPARISON:  Radiograph earlier today. FINDINGS: Urinary Tract: Decompressed included ureters. Partially distended urinary bladder. Bowel: Moderate colonic diverticulosis without diverticulitis normal appendix no acute bowel inflammation in the pelvis. Vascular/Lymphatic: Advanced aortic atherosclerosis. Prominent central mesenteric lymph nodes are partially included in the field of view, nonspecific. No iliac or inguinal adenopathy. Reproductive: Hysterectomy. Left ovary is slightly prominent for age measuring 2.9 x 2.2 cm, series 6, image 44, however no discrete mass or cyst. Right ovary is quiescent. Other: There is no pelvic free fluid. Small fat containing umbilical hernia. No inguinal hernia. Musculoskeletal: No fracture of the pelvis or hips. Mild bilateral degenerative hip joint space narrowing. Femoral heads are well seated. No proximal femur fractures. No large hip joint effusion. Intact pubic rami. Moderate degenerative change of the pubic symphysis. Mild degenerative change of the sacroiliac joints. No displaced sacral fracture. There is prominent degenerative change in the included lower lumbar spine. Anterolisthesis of L4 on L5 is facet mediated. Unremarkable pelvic musculature and  subcutaneous soft tissues. IMPRESSION: 1. No acute fracture of the pelvis or hips. Mild bilateral hip osteoarthritis. 2. Colonic diverticulosis without diverticulitis. 3. Left ovary is slightly prominent for age measuring 2.9 x 2.2 cm, however no discrete mass or cyst. Consider further evaluation with pelvic ultrasound on an elective outpatient basis. Aortic Atherosclerosis (ICD10-I70.0). Electronically Signed   By: Keith Rake M.D.   On: 05/20/2021 17:19   DG Hip Unilat W or Wo Pelvis 2-3 Views Left  Result Date: 05/20/2021 CLINICAL DATA:  Groin pain EXAM: DG HIP (WITH OR WITHOUT PELVIS) 2-3V LEFT COMPARISON:  None. FINDINGS: Hips are located. No evidence of pelvic fracture or sacral fracture. Mild degenerative joint space narrowing medially in LEFT and RIGHT hip. Dedicated view of the LEFT hip demonstrates no femoral neck fracture. IMPRESSION: No acute fracture or dislocation. Mild osteoarthritis. Electronically Signed   By: Suzy Bouchard M.D.   On: 05/20/2021 12:42    Procedures Procedures   Medications Ordered in ED Medications - No data to display  ED Course  I have reviewed the triage vital signs and the nursing notes.  Pertinent labs & imaging results that were available during my care of the patient were reviewed by me and considered in my medical decision making (see chart for details).    MDM Rules/Calculators/A&P                          I cannot reproduce the patient's pain.  From history it sounds like this is a muscle strain as it is only painful when she puts weight and walks.  However with the Tylenol her family gave her she is now able to walk much better.  CT was obtained given the negative x-ray at urgent care and this is unremarkable.  I did let her know about the  ovary being slightly enlarged and needing an outpatient ultrasound.  However no obvious fractures and she is ambulatory here.  Will discharge home with supportive care. Final Clinical Impression(s) / ED  Diagnoses Final diagnoses:  Groin strain, unspecified laterality, initial encounter    Rx / DC Orders ED Discharge Orders     None        Sherwood Gambler, MD 05/20/21 1906

## 2021-05-20 NOTE — ED Provider Notes (Signed)
[facility]    CSN: [id number] Arrival date & time: 05/20/21  1056      History   Chief Complaint No chief complaint on file.   HPI Cathy Larsen is a 87 y.o. female.   HPI Patient presents today with a complaint of low pelvis and left groin pain x 3 days which she endorses is exaggarated by walking. Of concern patients BP is elevated on arrival and she has taken blood pressure medications. Denies any associated chest pain , swelling, or weakness.  She is accompanied today by her family member who lives in the household with her and reports that she has not been eating well and has had a measurable weight loss of about 10 pounds in comparison with our scale on the scale at her primary doctor's office who she saw 2 months ago.  Patient does recall onset of pain occurring after stepping from a pickup truck 3 days ago and is unaware if she may have landed inappropriately causing pain in the left lower groin area.  She reports loss of urge to urinate she only goes when she thinks about urinating.  She is on a diuretic and is a diabetic. No history of recurrent urinary tract infections.  Patient has a history of mild osteoporosis and some occasional hip pain although does not extend as to the level of pain that she is experiencing at present. Past Medical History:  Diagnosis Date   Arthritis    feet and hands   CAD (coronary artery disease)    cath 1998 with 40% lesion in LAD but no other disease   Carpal tunnel syndrome    Diabetes mellitus    Gout 03/1998   Hyperlipidemia 08/1997   Hypertension 09/23/1995   Skin cancer     Patient Active Problem List   Diagnosis Date Noted   Atypical chest pain 09/15/2019   Dysphasia 11/08/2018   Cough 11/08/2018   Healthcare maintenance 09/07/2017   Other social stressor 09/07/2017   Rash 09/07/2017   Murmur 03/05/2017   Carpal tunnel syndrome    Advance care planning 08/31/2015   Knee pain, right 09/20/2014   Medicare annual  wellness visit, subsequent 01/21/2014   Cyst of finger 08/05/2013   RLS (restless legs syndrome) 01/12/2013   History of TIA (transient ischemic attack) 07/04/2012   Diabetes mellitus without complication ([place]) 04/27/7627   TIA (transient ischemic attack) 07/04/2012   Vertigo 05/19/2012   FATIGUE 12/25/2009   HEEL PAIN, BILATERAL 06/23/2008   HLD (hyperlipidemia) 05/13/2007   GOUT 05/13/2007   Essential hypertension 05/13/2007   CAD 05/13/2007   ANOMALY, CONGENITAL, BREAST NEC 05/13/2007   HX, PERSONAL, ARTHRITIS 05/13/2007   FIBROCYSTIC BREAST DISEASE, HX OF 05/13/2007    Past Surgical History:  Procedure Laterality Date   BIOPSY BREAST  03/2000   Ductogram right breast w/ biopsy- obstructing lesion benign by path. (Dr. Annamaria Boots)    BLADDER SURGERY     bladder tack    CARDIAC CATHETERIZATION  03/1997   40% occulsion lad    CARPAL TUNNEL RELEASE Right 03/14/2014   Procedure: RIGHT CARPAL TUNNEL RELEASE;  Surgeon: Tennis Must, MD;  Location: [facility] [place];  Service: Orthopedics;  Laterality: Right;   CATARACT EXTRACTION     PARTIAL HYSTERECTOMY     VAGINAL DELIVERY     x 3   YAG LASER APPLICATION Right 01/03/5175   Procedure: YAG LASER APPLICATION;  Surgeon: Williams Che, MD;  Location: [facility];  Service:  Ophthalmology;  Laterality: Right;    OB History   No obstetric history on file.      Home Medications    Prior to Admission medications   Medication Sig Start Date End Date Taking? Authorizing Provider  BOSWELLIA SERRATA PO Take by mouth daily.    [provider]  Flaxseed, Linseed, (FLAX SEED OIL) 1000 MG CAPS Take 1 capsule by mouth daily.    [provider]  Ginger, Zingiber officinalis, (GINGER ROOT) 550 MG CAPS Take 96 mg by mouth 2 (two) times daily. (3 capsules)    [provider]  glucose blood (ONE TOUCH ULTRA TEST) test strip USE DAILY TO CHECK SUGAR.  Dx 11.9. 03/16/21   Tonia Ghent, MD  hydrochlorothiazide  (HYDRODIURIL) 25 MG tablet Take 1 tablet (25 mg total) by mouth daily. 05/03/21   Tonia Ghent, MD  losartan (COZAAR) 50 MG tablet Take 1 tablet (50 mg total) by mouth daily. 09/14/20   Tonia Ghent, MD  Multiple Vitamin (MULTIVITAMIN WITH MINERALS) TABS Take 1 tablet by mouth daily. ALIVE Womens' 50+    [provider]  nitroGLYCERIN (NITROSTAT) 0.4 MG SL tablet Place 1 tablet (0.4 mg total) under the tongue every 5 (five) minutes as needed for chest pain (max 3 tabs in 15 minutes). 09/13/19   Tonia Ghent, MD  Omega-3 Fatty Acids (FISH OIL) 1000 MG CAPS Take 1,000 mg by mouth daily.     [provider]  potassium chloride (KLOR-CON) 10 MEQ tablet Take 1 tablet (10 mEq total) by mouth daily. 09/14/20   Tonia Ghent, MD  Turmeric 500 MG CAPS Take by mouth daily.    [provider]    Family History Family History  Problem Relation Age of Onset   Cancer Mother        breast    Kidney failure Mother    Cancer Father        colon    Cancer Brother        bladder    Cancer Brother        lung   Hypertension Sister    Hypertension Sister    Diabetes Neg Hx    Stroke Neg Hx     Social History Social History   Tobacco Use   Smoking status: Never   Smokeless tobacco: Never  Vaping Use   Vaping Use: Never used  Substance Use Topics   Alcohol use: No    Alcohol/week: 0.0 standard drinks   Drug use: No     Allergies   Statins, Keflex [cephalexin], and Requip [ropinirole hcl]   Review of Systems Review of Systems Pertinent negatives listed in HPI  Physical Exam Triage Vital Signs ED Triage Vitals  Enc Vitals Group     BP 05/20/21 1121 (!) 211/106     Pulse Rate 05/20/21 1121 95     Resp 05/20/21 1121 17     Temp 05/20/21 1121 98.4 F (36.9 C)     Temp Source 05/20/21 1121 Tympanic     SpO2 05/20/21 1121 95 %     Weight --      Height --      Head Circumference --      Peak Flow --      Pain Score 05/20/21 1127 0     Pain  Loc --      Pain Edu? --      Excl. in [place]? --    No data found.  Updated Vital Signs BP (!) 201/105 (BP Location: Right Arm)   Pulse 95   Temp 98.4 F (36.9 C) (Tympanic)   Resp 17   Wt 182 lb 9.6 oz (82.8 kg)   SpO2 95%   BMI 32.35 kg/m   Visual Acuity Right Eye Distance:   Left Eye Distance:   Bilateral Distance:    Right Eye Near:   Left Eye Near:    Bilateral Near:     Physical Exam Constitutional:      General: She is in acute distress.  Cardiovascular:     Rate and Rhythm: Normal rate and regular rhythm.  Pulmonary:     Effort: Pulmonary effort is normal.     Breath sounds: Normal breath sounds.  Abdominal:    Musculoskeletal:     Left hip: Tenderness present. Decreased strength.     Right lower leg: No edema.     Left lower leg: No edema.  Skin:    General: Skin is warm.  Neurological:     Mental Status: She is alert.      UC Treatments / Results  Labs (all labs ordered are listed, but only abnormal results are displayed) Labs Reviewed  POCT URINALYSIS DIP (MANUAL ENTRY) - Abnormal; Notable for the following components:      Result Value   Glucose, UA =100 (*)    Blood, UA trace-intact (*)    All other components within normal limits    EKG   Radiology DG Hip Unilat W or Wo Pelvis 2-3 Views Left  Result Date: 05/20/2021 CLINICAL DATA:  Groin pain EXAM: DG HIP (WITH OR WITHOUT PELVIS) 2-3V LEFT COMPARISON:  None. FINDINGS: Hips are located. No evidence of pelvic fracture or sacral fracture. Mild degenerative joint space narrowing medially in LEFT and RIGHT hip. Dedicated view of the LEFT hip demonstrates no femoral neck fracture. IMPRESSION: No acute fracture or dislocation. Mild osteoarthritis. Electronically Signed   By: Suzy Bouchard M.D.   On: 05/20/2021 12:42    Procedures Procedures (including critical care time)  Medications Ordered in UC Medications - No data to display  Initial Impression / Assessment and Plan / UC Course  I  have reviewed the triage vital signs and the nursing notes.  Pertinent labs & imaging results that were available during my care of the patient were reviewed by me and considered in my medical decision making (see chart for details).    Left groin pain acute pain in the pelvis imaging shows mild degenerative joint disease with mild narrowing although nothing to correlate with the patient's level of pain and distress with ambulation.  Referring patient to the ER for further work-up and evaluation as she has had a marked change in activity over the last 3 days and is unable to bear weight as she has in the past.  Unable to determine underlying cause of symptoms today given age and increased risk for falls and fractures patient warrants further work-up and evaluation in the setting of the emergency department. Final Clinical Impressions(s) / UC Diagnoses   Final diagnoses:  Left groin pain  Acute pain in female pelvis     Discharge Instructions      Concern given the degree of pain that you could have a muscle tear or some etiology involving the bladder that can not be detected on x-ray. I recommend evaluation in the setting of the emergency department for further diagnostic imaging and work-up to determine the source of pain.  ED Prescriptions   None    PDMP not reviewed this encounter.   Scot Jun, FNP 05/20/21 [phone]

## 2021-05-20 NOTE — ED Notes (Signed)
Patient is being discharged from the Urgent Care and sent to the Emergency Department via pov. Per Lavell Anchors FNP, patient is in need of higher level of care due to abd pain. Patient is aware and verbalizes understanding of plan of care.  Vitals:   05/20/21 1121 05/20/21 1128  BP: (!) 211/106 (!) 201/105  Pulse: 95   Resp: 17   Temp: 98.4 F (36.9 C)   SpO2: 95%

## 2021-05-20 NOTE — ED Triage Notes (Signed)
Pain in lower abd x 3days.  States she has to hold her abd when she walks help with the pain.

## 2021-05-20 NOTE — Discharge Instructions (Addendum)
Concern given the degree of pain that you could have a muscle tear or some etiology involving the bladder that can not be detected on x-ray. I recommend evaluation in the setting of the emergency department for further diagnostic imaging and work-up to determine the source of pain.

## 2021-05-20 NOTE — ED Triage Notes (Signed)
C/low abd. Pain ONLY WHILE ACTIVE AND UPRIGHT. She is in no distress. Seen at our [facility] today and they recommended she come here for recheck. She denies fever/n/v/d/dysuria.

## 2021-05-20 NOTE — Discharge Instructions (Addendum)
If you develop new or worsening pain, inability to bear weight, or any other new/concerning symptoms then return to the ER for evaluation.  Your CT scan showed that your left ovary was slightly larger than your right and you should get an outpatient ultrasound by your primary care physician to make sure nothing else is wrong.

## 2021-05-30 ENCOUNTER — Other Ambulatory Visit: Payer: Self-pay

## 2021-05-30 ENCOUNTER — Ambulatory Visit (INDEPENDENT_AMBULATORY_CARE_PROVIDER_SITE_OTHER): Payer: Medicare HMO | Admitting: Podiatry

## 2021-05-30 DIAGNOSIS — M79674 Pain in right toe(s): Secondary | ICD-10-CM

## 2021-05-30 DIAGNOSIS — B351 Tinea unguium: Secondary | ICD-10-CM | POA: Diagnosis not present

## 2021-05-30 DIAGNOSIS — M79675 Pain in left toe(s): Secondary | ICD-10-CM | POA: Diagnosis not present

## 2021-05-30 NOTE — Progress Notes (Signed)
   SUBJECTIVE Patient presents to office today complaining of elongated, thickened nails that cause pain while ambulating in shoes.  Patient is unable to trim their own nails. Patient is here for further evaluation and treatment.  Past Medical History:  Diagnosis Date   Arthritis    feet and hands   CAD (coronary artery disease)    cath 1998 with 40% lesion in LAD but no other disease   Carpal tunnel syndrome    Diabetes mellitus    Gout 03/1998   Hyperlipidemia 08/1997   Hypertension 09/23/1995   Skin cancer     OBJECTIVE General Patient is awake, alert, and oriented x 3 and in no acute distress. Derm Skin is dry and supple bilateral. Negative open lesions or macerations. Remaining integument unremarkable. Nails are tender, long, thickened and dystrophic with subungual debris, consistent with onychomycosis, 1-5 bilateral. No signs of infection noted. Vasc  DP and PT pedal pulses palpable bilaterally. Temperature gradient within normal limits.  Neuro Epicritic and protective threshold sensation grossly intact bilaterally.  Musculoskeletal Exam No symptomatic pedal deformities noted bilateral. Muscular strength within normal limits.  ASSESSMENT 1.  Pain due to onychomycosis of toenails both  PLAN OF CARE 1. Patient evaluated today.  2. Instructed to maintain good pedal hygiene and foot care.  3. Mechanical debridement of nails 1-5 bilaterally performed using a nail nipper. Filed with dremel without incident.  4. Return to clinic in 3 mos.   *Grandson's name is Tim.  He is her caregiver   Edrick Kins, DPM [facility]  Dr. Edrick Kins, DPM    [address], [place] [address]                Office [phone]  Fax [phone]

## 2021-09-20 ENCOUNTER — Telehealth: Payer: Self-pay | Admitting: Family Medicine

## 2021-09-20 MED ORDER — POTASSIUM CHLORIDE ER 10 MEQ PO TBCR: 10.0000 meq | EXTENDED_RELEASE_TABLET | Freq: Every day | ORAL | 3 refills | Status: DC

## 2021-09-20 MED ORDER — HYDROCHLOROTHIAZIDE 25 MG PO TABS: 25.0000 mg | ORAL_TABLET | Freq: Every day | ORAL | 3 refills | Status: DC

## 2021-09-20 MED ORDER — LOSARTAN POTASSIUM 50 MG PO TABS: 50.0000 mg | ORAL_TABLET | Freq: Every day | ORAL | 3 refills | Status: DC

## 2021-09-20 NOTE — Telephone Encounter (Signed)
Pt grandson called she needs refills on the following sent to [facility] in [place] -potassium chloride (KLOR-CON) 10 MEQ tablet -losartan (COZAAR) 50 MG tablet -hydrochlorothiazide (HYDRODIURIL) 25 MG tablet

## 2021-09-20 NOTE — Addendum Note (Signed)
Addended by: Sherrilee Gilles B on: 09/20/2021 03:55 PM   Modules accepted: Orders

## 2021-09-20 NOTE — Telephone Encounter (Signed)
Rxs sent

## 2021-10-16 ENCOUNTER — Encounter: Payer: Self-pay | Admitting: Family Medicine

## 2021-10-16 DIAGNOSIS — L570 Actinic keratosis: Secondary | ICD-10-CM | POA: Diagnosis not present

## 2021-10-16 DIAGNOSIS — L821 Other seborrheic keratosis: Secondary | ICD-10-CM | POA: Diagnosis not present

## 2021-10-16 DIAGNOSIS — C44722 Squamous cell carcinoma of skin of right lower limb, including hip: Secondary | ICD-10-CM | POA: Diagnosis not present

## 2021-10-16 DIAGNOSIS — C44629 Squamous cell carcinoma of skin of left upper limb, including shoulder: Secondary | ICD-10-CM | POA: Diagnosis not present

## 2021-10-16 DIAGNOSIS — L820 Inflamed seborrheic keratosis: Secondary | ICD-10-CM | POA: Diagnosis not present

## 2021-10-16 DIAGNOSIS — C44622 Squamous cell carcinoma of skin of right upper limb, including shoulder: Secondary | ICD-10-CM | POA: Diagnosis not present

## 2021-10-16 DIAGNOSIS — D485 Neoplasm of uncertain behavior of skin: Secondary | ICD-10-CM | POA: Diagnosis not present

## 2021-11-10 ENCOUNTER — Encounter (HOSPITAL_COMMUNITY)
Admission: EM | Disposition: A | Discharge status: Home / Self Care | Payer: Self-pay | Source: Home / Self Care | Attending: Cardiovascular Disease

## 2021-11-10 ENCOUNTER — Inpatient Hospital Stay (HOSPITAL_COMMUNITY)
Admission: EM | Admit: 2021-11-10 | Discharge: 2021-11-12 | DRG: 247 | Disposition: A | Discharge status: Home / Self Care | Payer: Medicare HMO | Attending: Cardiovascular Disease | Admitting: Cardiovascular Disease

## 2021-11-10 ENCOUNTER — Encounter (HOSPITAL_COMMUNITY): Payer: Self-pay | Admitting: Emergency Medicine

## 2021-11-10 ENCOUNTER — Other Ambulatory Visit: Payer: Self-pay

## 2021-11-10 DIAGNOSIS — I10 Essential (primary) hypertension: Secondary | ICD-10-CM | POA: Diagnosis not present

## 2021-11-10 DIAGNOSIS — Z28311 Partially vaccinated for covid-19: Secondary | ICD-10-CM

## 2021-11-10 DIAGNOSIS — Z79899 Other long term (current) drug therapy: Secondary | ICD-10-CM | POA: Diagnosis not present

## 2021-11-10 DIAGNOSIS — Z8249 Family history of ischemic heart disease and other diseases of the circulatory system: Secondary | ICD-10-CM | POA: Diagnosis not present

## 2021-11-10 DIAGNOSIS — M109 Gout, unspecified: Secondary | ICD-10-CM | POA: Diagnosis present

## 2021-11-10 DIAGNOSIS — R079 Chest pain, unspecified: Secondary | ICD-10-CM | POA: Diagnosis not present

## 2021-11-10 DIAGNOSIS — M19071 Primary osteoarthritis, right ankle and foot: Secondary | ICD-10-CM | POA: Diagnosis present

## 2021-11-10 DIAGNOSIS — M19072 Primary osteoarthritis, left ankle and foot: Secondary | ICD-10-CM | POA: Diagnosis present

## 2021-11-10 DIAGNOSIS — I2111 ST elevation (STEMI) myocardial infarction involving right coronary artery: Principal | ICD-10-CM | POA: Diagnosis present

## 2021-11-10 DIAGNOSIS — I2510 Atherosclerotic heart disease of native coronary artery without angina pectoris: Secondary | ICD-10-CM | POA: Diagnosis present

## 2021-11-10 DIAGNOSIS — Z20822 Contact with and (suspected) exposure to covid-19: Secondary | ICD-10-CM | POA: Diagnosis not present

## 2021-11-10 DIAGNOSIS — E119 Type 2 diabetes mellitus without complications: Secondary | ICD-10-CM | POA: Diagnosis present

## 2021-11-10 DIAGNOSIS — Z881 Allergy status to other antibiotic agents status: Secondary | ICD-10-CM

## 2021-11-10 DIAGNOSIS — Z888 Allergy status to other drugs, medicaments and biological substances status: Secondary | ICD-10-CM | POA: Diagnosis not present

## 2021-11-10 DIAGNOSIS — M19042 Primary osteoarthritis, left hand: Secondary | ICD-10-CM | POA: Diagnosis present

## 2021-11-10 DIAGNOSIS — I499 Cardiac arrhythmia, unspecified: Secondary | ICD-10-CM | POA: Diagnosis not present

## 2021-11-10 DIAGNOSIS — Z809 Family history of malignant neoplasm, unspecified: Secondary | ICD-10-CM

## 2021-11-10 DIAGNOSIS — Z955 Presence of coronary angioplasty implant and graft: Secondary | ICD-10-CM

## 2021-11-10 DIAGNOSIS — M19041 Primary osteoarthritis, right hand: Secondary | ICD-10-CM | POA: Diagnosis present

## 2021-11-10 DIAGNOSIS — R9431 Abnormal electrocardiogram [ECG] [EKG]: Secondary | ICD-10-CM | POA: Diagnosis not present

## 2021-11-10 DIAGNOSIS — Z85828 Personal history of other malignant neoplasm of skin: Secondary | ICD-10-CM | POA: Diagnosis not present

## 2021-11-10 DIAGNOSIS — Z743 Need for continuous supervision: Secondary | ICD-10-CM | POA: Diagnosis not present

## 2021-11-10 DIAGNOSIS — E785 Hyperlipidemia, unspecified: Secondary | ICD-10-CM | POA: Diagnosis present

## 2021-11-10 DIAGNOSIS — I440 Atrioventricular block, first degree: Secondary | ICD-10-CM | POA: Diagnosis not present

## 2021-11-10 DIAGNOSIS — I213 ST elevation (STEMI) myocardial infarction of unspecified site: Secondary | ICD-10-CM | POA: Diagnosis not present

## 2021-11-10 HISTORY — PX: LEFT HEART CATH AND CORONARY ANGIOGRAPHY: CATH118249

## 2021-11-10 HISTORY — PX: CORONARY/GRAFT ACUTE MI REVASCULARIZATION: CATH118305

## 2021-11-10 LAB — CBC WITH DIFFERENTIAL/PLATELET
Abs Immature Granulocytes: 0.04 10*3/uL (ref 0.00–0.07)
Basophils Absolute: 0 10*3/uL (ref 0.0–0.1)
Basophils Relative: 0 %
Eosinophils Absolute: 0.1 10*3/uL (ref 0.0–0.5)
Eosinophils Relative: 1 %
HCT: 42 % (ref 36.0–46.0)
Hemoglobin: 13.7 g/dL (ref 12.0–15.0)
Immature Granulocytes: 0 %
Lymphocytes Relative: 20 %
Lymphs Abs: 2 10*3/uL (ref 0.7–4.0)
MCH: 29.8 pg (ref 26.0–34.0)
MCHC: 32.6 g/dL (ref 30.0–36.0)
MCV: 91.5 fL (ref 80.0–100.0)
Monocytes Absolute: 0.9 10*3/uL (ref 0.1–1.0)
Monocytes Relative: 9 %
Neutro Abs: 7.1 10*3/uL (ref 1.7–7.7)
Neutrophils Relative %: 70 %
Platelets: 310 10*3/uL (ref 150–400)
RBC: 4.59 MIL/uL (ref 3.87–5.11)
RDW: 12.7 % (ref 11.5–15.5)
WBC: 10.2 10*3/uL (ref 4.0–10.5)
nRBC: 0 % (ref 0.0–0.2)

## 2021-11-10 LAB — COMPREHENSIVE METABOLIC PANEL
ALT: 21 U/L (ref 0–44)
AST: 34 U/L (ref 15–41)
Albumin: 3.7 g/dL (ref 3.5–5.0)
Alkaline Phosphatase: 60 U/L (ref 38–126)
Anion gap: 11 (ref 5–15)
BUN: 18 mg/dL (ref 8–23)
CO2: 24 mmol/L (ref 22–32)
Calcium: 9.1 mg/dL (ref 8.9–10.3)
Chloride: 99 mmol/L (ref 98–111)
Creatinine, Ser: 1.2 mg/dL — ABNORMAL HIGH (ref 0.44–1.00)
GFR, Estimated: 42 mL/min — ABNORMAL LOW (ref >60)
Glucose, Bld: 249 mg/dL — ABNORMAL HIGH (ref 70–99)
Potassium: 4 mmol/L (ref 3.5–5.1)
Sodium: 134 mmol/L — ABNORMAL LOW (ref 135–145)
Total Bilirubin: 0.7 mg/dL (ref 0.3–1.2)
Total Protein: 6.7 g/dL (ref 6.5–8.1)

## 2021-11-10 LAB — CREATININE, SERUM
Creatinine, Ser: 1.13 mg/dL — ABNORMAL HIGH (ref 0.44–1.00)
GFR, Estimated: 45 mL/min — ABNORMAL LOW (ref >60)

## 2021-11-10 LAB — RESP PANEL BY RT-PCR (FLU A&B, COVID) ARPGX2
Influenza A by PCR: NEGATIVE
Influenza B by PCR: NEGATIVE
SARS Coronavirus 2 by RT PCR: NEGATIVE

## 2021-11-10 LAB — HEMOGLOBIN A1C
Hgb A1c MFr Bld: 8.1 % — ABNORMAL HIGH (ref 4.8–5.6)
Mean Plasma Glucose: 185.77 mg/dL

## 2021-11-10 LAB — APTT: aPTT: 28 seconds (ref 24–36)

## 2021-11-10 LAB — PROTIME-INR
INR: 1.1 (ref 0.8–1.2)
Prothrombin Time: 14.1 seconds (ref 11.4–15.2)

## 2021-11-10 LAB — LIPID PANEL
Cholesterol: 233 mg/dL — ABNORMAL HIGH (ref 0–200)
HDL: 38 mg/dL — ABNORMAL LOW (ref >40)
LDL Cholesterol: 141 mg/dL — ABNORMAL HIGH (ref 0–99)
Total CHOL/HDL Ratio: 6.1 RATIO
Triglycerides: 268 mg/dL — ABNORMAL HIGH (ref <150)
VLDL: 54 mg/dL — ABNORMAL HIGH (ref 0–40)

## 2021-11-10 LAB — TROPONIN I (HIGH SENSITIVITY)
Troponin I (High Sensitivity): 171 ng/L (ref <18)
Troponin I (High Sensitivity): >24000 ng/L (ref <18)

## 2021-11-10 LAB — POCT ACTIVATED CLOTTING TIME
Activated Clotting Time: 263 seconds
Activated Clotting Time: 275 seconds

## 2021-11-10 SURGERY — CORONARY/GRAFT ACUTE MI REVASCULARIZATION
Anesthesia: LOCAL

## 2021-11-10 MED ORDER — EZETIMIBE 10 MG PO TABS
10.0000 mg | ORAL_TABLET | Freq: Every day | ORAL | Status: DC
  Administered 2021-11-10 – 2021-11-12 (×3): 10 mg via ORAL
  Filled 2021-11-10 (×3): qty 1

## 2021-11-10 MED ORDER — SODIUM CHLORIDE 0.9 % IV SOLN: 250.0000 mL | INTRAVENOUS | Status: DC | PRN

## 2021-11-10 MED ORDER — HEPARIN SODIUM (PORCINE) 5000 UNIT/ML IJ SOLN: 60.0000 [IU]/kg | Freq: Once | INTRAMUSCULAR | Status: DC

## 2021-11-10 MED ORDER — ENOXAPARIN SODIUM 40 MG/0.4ML IJ SOSY
40.0000 mg | PREFILLED_SYRINGE | INTRAMUSCULAR | Status: DC
  Administered 2021-11-11 – 2021-11-12 (×2): 40 mg via SUBCUTANEOUS
  Filled 2021-11-10 (×2): qty 0.4

## 2021-11-10 MED ORDER — VERAPAMIL HCL 2.5 MG/ML IV SOLN
INTRAVENOUS | Status: DC | PRN
  Administered 2021-11-10: 10 mL via INTRA_ARTERIAL

## 2021-11-10 MED ORDER — HYDRALAZINE HCL 20 MG/ML IJ SOLN: 10.0000 mg | INTRAMUSCULAR | Status: AC | PRN

## 2021-11-10 MED ORDER — SODIUM CHLORIDE 0.9 % IV SOLN
INTRAVENOUS | Status: DC
Start: 2021-11-10 — End: 2021-11-12

## 2021-11-10 MED ORDER — LIDOCAINE HCL (PF) 1 % IJ SOLN
INTRAMUSCULAR | Status: AC
  Filled 2021-11-10: qty 30

## 2021-11-10 MED ORDER — SODIUM CHLORIDE 0.9% FLUSH
3.0000 mL | Freq: Two times a day (BID) | INTRAVENOUS | Status: DC
  Administered 2021-11-11 – 2021-11-12 (×4): 3 mL via INTRAVENOUS

## 2021-11-10 MED ORDER — MIDAZOLAM HCL 2 MG/2ML IJ SOLN
INTRAMUSCULAR | Status: DC | PRN
  Administered 2021-11-10: 1 mg via INTRAVENOUS

## 2021-11-10 MED ORDER — CARVEDILOL 3.125 MG PO TABS
3.1250 mg | ORAL_TABLET | Freq: Two times a day (BID) | ORAL | Status: DC
  Administered 2021-11-11 (×2): 3.125 mg via ORAL
  Filled 2021-11-10 (×3): qty 1

## 2021-11-10 MED ORDER — SODIUM CHLORIDE 0.9 % IV SOLN
INTRAVENOUS | Status: DC | PRN
  Administered 2021-11-10: 10 mL/h via INTRAVENOUS

## 2021-11-10 MED ORDER — HEPARIN (PORCINE) IN NACL 1000-0.9 UT/500ML-% IV SOLN
INTRAVENOUS | Status: AC
  Filled 2021-11-10: qty 1000

## 2021-11-10 MED ORDER — TICAGRELOR 90 MG PO TABS
ORAL_TABLET | ORAL | Status: AC
  Filled 2021-11-10: qty 2

## 2021-11-10 MED ORDER — HEPARIN (PORCINE) IN NACL 1000-0.9 UT/500ML-% IV SOLN
INTRAVENOUS | Status: DC | PRN
  Administered 2021-11-10 (×3): 500 mL

## 2021-11-10 MED ORDER — ONDANSETRON HCL 4 MG/2ML IJ SOLN: 4.0000 mg | Freq: Four times a day (QID) | INTRAMUSCULAR | Status: DC | PRN

## 2021-11-10 MED ORDER — LIDOCAINE HCL (PF) 1 % IJ SOLN
INTRAMUSCULAR | Status: DC | PRN
  Administered 2021-11-10: 2 mL

## 2021-11-10 MED ORDER — HEPARIN SODIUM (PORCINE) 1000 UNIT/ML IJ SOLN
INTRAMUSCULAR | Status: AC
  Filled 2021-11-10: qty 10

## 2021-11-10 MED ORDER — IOHEXOL 350 MG/ML SOLN
INTRAVENOUS | Status: DC | PRN
  Administered 2021-11-10: 130 mL

## 2021-11-10 MED ORDER — SODIUM CHLORIDE 0.9 % IV SOLN: INTRAVENOUS | Status: AC

## 2021-11-10 MED ORDER — TICAGRELOR 90 MG PO TABS
ORAL_TABLET | ORAL | Status: DC | PRN
  Administered 2021-11-10: 180 mg via ORAL

## 2021-11-10 MED ORDER — TICAGRELOR 90 MG PO TABS
90.0000 mg | ORAL_TABLET | Freq: Two times a day (BID) | ORAL | Status: DC
  Administered 2021-11-11 (×2): 90 mg via ORAL
  Filled 2021-11-10 (×2): qty 1

## 2021-11-10 MED ORDER — SODIUM CHLORIDE 0.9% FLUSH: 3.0000 mL | INTRAVENOUS | Status: DC | PRN

## 2021-11-10 MED ORDER — HEPARIN SODIUM (PORCINE) 5000 UNIT/ML IJ SOLN
4000.0000 [IU] | Freq: Once | INTRAMUSCULAR | Status: AC
  Administered 2021-11-10: 4000 [IU] via INTRAVENOUS

## 2021-11-10 MED ORDER — MIDAZOLAM HCL 2 MG/2ML IJ SOLN
INTRAMUSCULAR | Status: AC
  Filled 2021-11-10: qty 2

## 2021-11-10 MED ORDER — LABETALOL HCL 5 MG/ML IV SOLN: 10.0000 mg | INTRAVENOUS | Status: AC | PRN

## 2021-11-10 MED ORDER — HEPARIN SODIUM (PORCINE) 1000 UNIT/ML IJ SOLN
INTRAMUSCULAR | Status: DC | PRN
  Administered 2021-11-10: 5000 [IU] via INTRAVENOUS
  Administered 2021-11-10 (×2): 2000 [IU] via INTRAVENOUS

## 2021-11-10 MED ORDER — VERAPAMIL HCL 2.5 MG/ML IV SOLN
INTRAVENOUS | Status: AC
  Filled 2021-11-10: qty 2

## 2021-11-10 MED ORDER — HEPARIN (PORCINE) IN NACL 1000-0.9 UT/500ML-% IV SOLN
INTRAVENOUS | Status: AC
  Filled 2021-11-10: qty 500

## 2021-11-10 MED ORDER — ACETAMINOPHEN 325 MG PO TABS: 650.0000 mg | ORAL_TABLET | ORAL | Status: DC | PRN

## 2021-11-10 MED ORDER — ASPIRIN 81 MG PO CHEW: 324.0000 mg | CHEWABLE_TABLET | Freq: Once | ORAL | Status: DC

## 2021-11-10 MED ORDER — ASPIRIN 81 MG PO CHEW
81.0000 mg | CHEWABLE_TABLET | Freq: Every day | ORAL | Status: DC
  Administered 2021-11-11 – 2021-11-12 (×2): 81 mg via ORAL
  Filled 2021-11-10 (×2): qty 1

## 2021-11-10 SURGICAL SUPPLY — 19 items
BALLN SAPPHIRE 2.0X12 (BALLOONS) ×2
BALLN SAPPHIRE ~~LOC~~ 3.5X18 (BALLOONS) ×1 IMPLANT
BALLOON SAPPHIRE 2.0X12 (BALLOONS) IMPLANT
CATH INFINITI 5 FR JL3.5 (CATHETERS) ×1 IMPLANT
CATH OPTITORQUE TIG 4.0 5F (CATHETERS) ×1 IMPLANT
CATH VISTA GUIDE 6FR JR4 (CATHETERS) ×1 IMPLANT
DEVICE RAD COMP TR BAND LRG (VASCULAR PRODUCTS) ×1 IMPLANT
GLIDESHEATH SLEND SS 6F .021 (SHEATH) ×1 IMPLANT
GUIDEWIRE INQWIRE 1.5J.035X260 (WIRE) IMPLANT
INQWIRE 1.5J .035X260CM (WIRE) ×2
KIT ENCORE 26 ADVANTAGE (KITS) ×1 IMPLANT
KIT HEART LEFT (KITS) ×3 IMPLANT
PACK CARDIAC CATHETERIZATION (CUSTOM PROCEDURE TRAY) ×3 IMPLANT
SHEATH PROBE COVER 6X72 (BAG) ×1 IMPLANT
STENT ONYX FRONTIER 3.5X22 (Permanent Stent) ×1 IMPLANT
TRANSDUCER W/STOPCOCK (MISCELLANEOUS) ×3 IMPLANT
TUBING CIL FLEX 10 FLL-RA (TUBING) ×3 IMPLANT
WIRE COUGAR XT STRL 190CM (WIRE) ×1 IMPLANT
WIRE HI TORQ VERSACORE-J 145CM (WIRE) ×1 IMPLANT

## 2021-11-10 NOTE — ED Triage Notes (Signed)
Pt here via [facility] from home for acute onset bil arm heaviness and fatigue.  EKG showed st elevations. Given 325 asa by family and 1 sl nitro en-route which improved the heaviness.  Vs  Hr: 92 Bp 172/87 Spo2 98%  Pt ao x 4.

## 2021-11-10 NOTE — Progress Notes (Signed)
°   11/10/21 1543  Clinical Encounter Type  Visited With Patient not available  Visit Type Critical Care (Code Stemi)  Referral From Nurse  Consult/Referral To Chaplain   Chaplain Jorene Guest responded to the level 2 page by speaking with the unit secretary. The unit secretary will call if chaplain support is needed. This note was prepared by Jeanine Luz, M.Div..  For questions please contact by phone [phone].

## 2021-11-10 NOTE — H&P (Signed)
Cardiology Admission History and Physical:   Patient ID: Cathy Larsen MRN: [id number]; DOB: Dec 21, 1925   Admission date: 11/10/2021  PCP:  Tonia Ghent, MD   [facility] Providers Cardiologist:  Mertie Moores, MD        Chief Complaint: New onset chest and arm pain  Patient Profile:   Cathy Larsen is a 88 y.o. female who developed new onset arm/chest pain today.  A code STEMI was activated in the field and she is brought emergently to [facility] emergency room.  History of Present Illness:   Cathy Larsen is a 88 year old fairly healthy female who has a history of hypertension for which she has been on losartan and HCTZ.  There is a reported history of statin allergy.  She remains active, and drives and lives with her grandson.  This morning she went and got her hair done.  After lunch today she began to notice bilateral arm discomfort.  Her pain persisted.  Ultimately [facility] was notified.  ECG showed inferolateral findings consistent with STEMI.  A code STEMI was activated.  She was transported to [facility] ER.  The emergency room she was still complaining of bilateral arm heaviness.  She was treated with 4000 units of heparin, aspirin and was evaluated by me.  She is now transported to the Cath Lab for emergent cardiac catheterization.   Past Medical History:  Diagnosis Date   Arthritis    feet and hands   CAD (coronary artery disease)    cath 1998 with 40% lesion in LAD but no other disease   Carpal tunnel syndrome    Diabetes mellitus    Gout 03/1998   Hyperlipidemia 08/1997   Hypertension 09/23/1995   Skin cancer     Past Surgical History:  Procedure Laterality Date   BIOPSY BREAST  03/2000   Ductogram right breast w/ biopsy- obstructing lesion benign by path. (Dr. Annamaria Boots)    BLADDER SURGERY     bladder tack    CARDIAC CATHETERIZATION  03/1997   40% occulsion lad    CARPAL TUNNEL RELEASE Right 03/14/2014   Procedure: RIGHT CARPAL TUNNEL RELEASE;  Surgeon: Tennis Must, MD;  Location: [place];  Service: Orthopedics;  Laterality: Right;   CATARACT EXTRACTION     PARTIAL HYSTERECTOMY     VAGINAL DELIVERY     x 3   YAG LASER APPLICATION Right 11/10/3843   Procedure: YAG LASER APPLICATION;  Surgeon: Williams Che, MD;  Location: [facility];  Service: Ophthalmology;  Laterality: Right;     Medications Prior to Admission: Prior to Admission medications   Medication Sig Start Date End Date Taking? Authorizing Provider  BOSWELLIA SERRATA PO Take by mouth daily.    [provider]  Flaxseed, Linseed, (FLAX SEED OIL) 1000 MG CAPS Take 1 capsule by mouth daily.    [provider]  Ginger, Zingiber officinalis, (GINGER ROOT) 550 MG CAPS Take 96 mg by mouth 2 (two) times daily. (3 capsules)    [provider]  glucose blood (ONE TOUCH ULTRA TEST) test strip USE DAILY TO CHECK SUGAR.  Dx 11.9. 03/16/21   Tonia Ghent, MD  hydrochlorothiazide (HYDRODIURIL) 25 MG tablet Take 1 tablet (25 mg total) by mouth daily. 09/20/21   Tonia Ghent, MD  losartan (COZAAR) 50 MG tablet Take 1 tablet (50 mg total) by mouth daily. 09/20/21   Tonia Ghent, MD  Multiple Vitamin (MULTIVITAMIN WITH MINERALS) TABS Take 1 tablet by  mouth daily. ALIVE Womens' 50+    [provider]  nitroGLYCERIN (NITROSTAT) 0.4 MG SL tablet Place 1 tablet (0.4 mg total) under the tongue every 5 (five) minutes as needed for chest pain (max 3 tabs in 15 minutes). 09/13/19   Tonia Ghent, MD  Omega-3 Fatty Acids (FISH OIL) 1000 MG CAPS Take 1,000 mg by mouth daily.     [provider]  potassium chloride (KLOR-CON) 10 MEQ tablet Take 1 tablet (10 mEq total) by mouth daily. 09/20/21   Tonia Ghent, MD  Turmeric 500 MG CAPS Take by mouth daily.    [provider]     Allergies:    Allergies  Allergen Reactions   Statins     Myalgias.    Keflex [Cephalexin] Other (See Comments)    Migraine   Requip [Ropinirole Hcl]  Other (See Comments)    headache    Social History:   Social History   Socioeconomic History   Marital status: Married    Spouse name: Not on file   Number of children: 3   Years of education: Not on file   Highest education level: Not on file  Occupational History   Occupation: Farms   Tobacco Use   Smoking status: Never   Smokeless tobacco: Never  Vaping Use   Vaping Use: Never used  Substance and Sexual Activity   Alcohol use: No    Alcohol/week: 0.0 standard drinks   Drug use: No   Sexual activity: Not Currently  Other Topics Concern   Not on file  Social History Narrative   Widowed 10/2012, husband had CVA and dementia   Grandson lives with patient as of 2018   Social Determinants of Health   Financial Resource Strain: Not on file  Food Insecurity: Not on file  Transportation Needs: Not on file  Physical Activity: Not on file  Stress: Not on file  Social Connections: Not on file  Intimate Partner Violence: Not on file    She lives at her grandson's house.  She drives and remains active.  Family History:   The patient's family history includes Cancer in her brother, brother, father, and mother; Hypertension in her sister and sister; Kidney failure in her mother. There is no history of Diabetes or Stroke.    ROS:  Please see the history of present illness.  Positive for hypertension, diabetes not on medication All other ROS reviewed and negative.     Physical Exam/Data:   Vitals:   11/10/21 1725 11/10/21 1729 11/10/21 1734 11/10/21 1739  BP: 134/72 125/75 120/73 126/73  Pulse: 96 90 78 78  Resp: 18 18 20  (!) 23  Temp:      TempSrc:      SpO2: 100% 100% 99% 98%  Weight:      Height:       No intake or output data in the 24 hours ending 11/10/21 1806 Last 3 Weights 11/10/2021 11/10/2021 05/20/2021  Weight (lbs) 180 lb 180 lb 182 lb 9.6 oz  Weight (kg) 81.647 kg 81.647 kg 82.827 kg     Body mass index is 31.89 kg/m.  General: Young appearing  88 year old female has remained active.  She is well nourished, well developed complaining of bilateral arm pain. HEENT: normal Neck: no JVD Vascular: No carotid bruits; Distal pulses 2+ bilaterally   Cardiac:  normal S1, S2; RRR; #6 systolic murmur. Lungs:  clear to auscultation bilaterally, no wheezing, rhonchi or rales  Abd: soft, nontender, no hepatomegaly  Ext: no edema Musculoskeletal:  No deformities, BUE and BLE strength normal and equal Skin: warm and dry  Neuro:  CNs 2-12 intact, no focal abnormalities noted Psych:  Normal affect    EKG:  The ECG that was done  was personally reviewed and demonstrates normal sinus rhythm at 94 bpm, 1 to 2 mm inferior ST elevation with J-point elevation V5 and V6, T wave inversion in leads I, aVL and V2.  First-degree AV block with a period of 3 to 5 ms.  Relevant CV Studies:   Laboratory Data:  High Sensitivity Troponin:  No results for input(s): TROPONINIHS in the last 720 hours.    ChemistryNo results for input(s): NA, K, CL, CO2, GLUCOSE, BUN, CREATININE, CALCIUM, MG, GFRNONAA, GFRAA, ANIONGAP in the last 168 hours.  No results for input(s): PROT, ALBUMIN, AST, ALT, ALKPHOS, BILITOT in the last 168 hours. Lipids No results for input(s): CHOL, TRIG, HDL, LABVLDL, LDLCALC, CHOLHDL in the last 168 hours. Hematology Recent Labs  Lab 11/10/21 1636  WBC 10.2  RBC 4.59  HGB 13.7  HCT 42.0  MCV 91.5  MCH 29.8  MCHC 32.6  RDW 12.7  PLT 310   Thyroid No results for input(s): TSH, FREET4 in the last 168 hours. BNPNo results for input(s): BNP, PROBNP in the last 168 hours.  DDimer No results for input(s): DDIMER in the last 168 hours.   Radiology/Studies:  No results found.   Assessment and Plan:   Inferior STEMI: Patient developed new onset bilateral arm heaviness this afternoon.  She has ECG findings of inferior STEMI with precordial ST depression and lateral ST depression.  I discussed emergent cardiac catheterization with she  and her family in the emergency room.  She hs agreed to proceed for urgent catheterization.  Discussed the risk benefits of the procedure. History of hypertension, currently has been treated with losartan and hydrochlorothiazide. Hyperlipidemia: Reported allergy to statins but question if myalgias and not a true allergy. Diabetes mellitus: Not on therapy, followed by Dr. Elsie Stain.   Risk Assessment/Risk Scores:    TIMI Risk Score for ST  Elevation MI:   The patient's TIMI risk score is  , which indicates a  % risk of all cause mortality at 30 days.        Severity of Illness: The appropriate patient status for this patient is INPATIENT. Inpatient status is judged to be reasonable and necessary in order to provide the required intensity of service to ensure the patient's safety. The patient's presenting symptoms, physical exam findings, and initial radiographic and laboratory data in the context of their chronic comorbidities is felt to place them at high risk for further clinical deterioration. Furthermore, it is not anticipated that the patient will be medically stable for discharge from the hospital within 2 midnights of admission.   * I certify that at the point of admission it is my clinical judgment that the patient will require inpatient hospital care spanning beyond 2 midnights from the point of admission due to high intensity of service, high risk for further deterioration and high frequency of surveillance required.*   For questions or updates, please contact [place] Please consult [email or web address] for contact info under     Signed, Shelva Majestic, MD  11/10/2021 6:06 PM

## 2021-11-10 NOTE — Progress Notes (Addendum)
°   11/10/21 1651  Clinical Encounter Type  Visited With Family  Visit Type Follow-up;Spiritual support;Patient in surgery  Referral From Nurse  Consult/Referral To Chaplain   Chaplain Jorene Guest received a page from ED Nurse stating the patient's daughter, Thayer Headings and her husband, Gershon Mussel  is in the [facility] waiting area and requesting a chaplain. The daughter displayed appropriate anticipated grief. Ike Bene understood and provided a listening presence as the daughter spoke about her mother's health scare. Ike Bene prayed while holding hands with the family per their request. The patient's daughter said they had diabetes and requested a snack. [place] provided hospitality, provided crackers, and juice. A chaplain remains available for support as needed. This note was prepared by Jeanine Luz, M.Div..  For questions please contact by phone [phone].

## 2021-11-10 NOTE — ED Provider Notes (Addendum)
[place] EMERGENCY DEPARTMENT Provider Note   CSN: [id number] Arrival date & time: 11/10/21  1614     History  No chief complaint on file.   Cathy Larsen is a 88 y.o. female.  4:34 PM Dr. Claiborne Billings at bedside Patient to go to cath lab  88 yo female ho hypertension presents today as code STEMI activated prehospital by ems.  Patient complaining of bilateral arm heaviness today for several hours at 10/10.  Denies covid, endorses covid vaccines but not last booster. Denies blood thinners, chest pain, n/v/ Patient received prehospital aspirin and nitro without relief       Home Medications Prior to Admission medications   Medication Sig Start Date End Date Taking? Authorizing Provider  BOSWELLIA SERRATA PO Take by mouth daily.    [provider]  Flaxseed, Linseed, (FLAX SEED OIL) 1000 MG CAPS Take 1 capsule by mouth daily.    [provider]  Ginger, Zingiber officinalis, (GINGER ROOT) 550 MG CAPS Take 96 mg by mouth 2 (two) times daily. (3 capsules)    [provider]  glucose blood (ONE TOUCH ULTRA TEST) test strip USE DAILY TO CHECK SUGAR.  Dx 11.9. 03/16/21   Tonia Ghent, MD  hydrochlorothiazide (HYDRODIURIL) 25 MG tablet Take 1 tablet (25 mg total) by mouth daily. 09/20/21   Tonia Ghent, MD  losartan (COZAAR) 50 MG tablet Take 1 tablet (50 mg total) by mouth daily. 09/20/21   Tonia Ghent, MD  Multiple Vitamin (MULTIVITAMIN WITH MINERALS) TABS Take 1 tablet by mouth daily. ALIVE Womens' 50+    [provider]  nitroGLYCERIN (NITROSTAT) 0.4 MG SL tablet Place 1 tablet (0.4 mg total) under the tongue every 5 (five) minutes as needed for chest pain (max 3 tabs in 15 minutes). 09/13/19   Tonia Ghent, MD  Omega-3 Fatty Acids (FISH OIL) 1000 MG CAPS Take 1,000 mg by mouth daily.     [provider]  potassium chloride (KLOR-CON) 10 MEQ tablet Take 1 tablet (10 mEq total) by mouth daily. 09/20/21   Tonia Ghent, MD  Turmeric 500 MG CAPS Take by mouth daily.    [provider]      Allergies    Statins, Keflex [cephalexin], and Requip [ropinirole hcl]    Review of Systems   Review of Systems  All other systems reviewed and are negative.  Physical Exam Updated Vital Signs There were no vitals taken for this visit. Physical Exam Vitals and nursing note reviewed.  Constitutional:      Appearance: Normal appearance.  HENT:     Head: Normocephalic.     Right Ear: External ear normal.     Left Ear: External ear normal.     Nose: Nose normal.     Mouth/Throat:     Pharynx: Oropharynx is clear.  Eyes:     Extraocular Movements: Extraocular movements intact.     Pupils: Pupils are equal, round, and reactive to light.  Cardiovascular:     Rate and Rhythm: Normal rate and regular rhythm.  Pulmonary:     Effort: Pulmonary effort is normal.     Breath sounds: Normal breath sounds.  Abdominal:     General: Abdomen is flat. Bowel sounds are normal.     Palpations: Abdomen is soft.  Musculoskeletal:        General: No swelling or tenderness. Normal range of motion.     Cervical back: Normal range of motion.  Skin:  General: Skin is warm.     Capillary Refill: Capillary refill takes less than 2 seconds.  Neurological:     General: No focal deficit present.     Mental Status: She is alert.  Psychiatric:        Mood and Affect: Mood normal.    ED Results / Procedures / Treatments   Labs (all labs ordered are listed, but only abnormal results are displayed) Labs Reviewed - No data to display  EKG EKG Interpretation  Date/Time:  Saturday November 10 2021 16:20:06 EST Ventricular Rate:  94 PR Interval:  305 QRS Duration: 92 QT Interval:  357 QTC Calculation: 447 R Axis:   -16 Text Interpretation: Sinus or ectopic atrial rhythm Prolonged PR interval Inferior infarct, acute (RCA) Lateral leads are also involved Probable RV involvement, suggest recording right  precordial leads >>> Acute MI <<< Confirmed by Pattricia Boss [phone]) on 11/10/2021 4:22:12 PM  Radiology No results found.  Procedures .Critical Care Performed by: Pattricia Boss, MD Authorized by: Pattricia Boss, MD   Critical care provider statement:    Critical care time (minutes):  45   Critical care was necessary to treat or prevent imminent or life-threatening deterioration of the following conditions:  Cardiac failure   Critical care was time spent personally by me on the following activities:  Development of treatment plan with patient or surrogate, discussions with consultants, evaluation of patient's response to treatment, examination of patient, ordering and review of laboratory studies, ordering and review of radiographic studies, ordering and performing treatments and interventions, pulse oximetry, re-evaluation of patient's condition and review of old charts    Medications Ordered in ED Medications  heparin injection 4,000 Units (has no administration in time range)    ED Course/ Medical Decision Making/ A&P Clinical Course as of 11/10/21 1634  Sat Nov 10, 2021  1631 Vss here Patient with 10/10 arm discomfort [DR]    Clinical Course User Index [DR] Pattricia Boss, MD                           Medical Decision Making Patient with chest heaviness onset today. No chest pain or associated symptoms Prehospital stemi initiated  Amount and/or Complexity of Data Reviewed Independent Historian: EMS External Data Reviewed: ECG.    Details: prehospital ekgs reviewed with st elevation 2.3. avf with somereciprocal changes in v1 and v2- 4 tracings reviewed and interpreted Labs: ordered. Radiology: ordered. ECG/medicine tests: ordered.  Critical Care Total time providing critical care: 30-74 minutes Final Clinical Impression(s) / ED Diagnoses Final diagnoses:  ST elevation myocardial infarction (STEMI), unspecified artery [facility])    Rx / DC Orders ED Discharge Orders      None         Pattricia Boss, MD 11/10/21 1634    Pattricia Boss, MD 11/10/21 [phone]

## 2021-11-11 ENCOUNTER — Other Ambulatory Visit (HOSPITAL_COMMUNITY): Payer: Medicare HMO

## 2021-11-11 ENCOUNTER — Inpatient Hospital Stay (HOSPITAL_COMMUNITY): Payer: Medicare HMO

## 2021-11-11 DIAGNOSIS — I2111 ST elevation (STEMI) myocardial infarction involving right coronary artery: Secondary | ICD-10-CM | POA: Diagnosis not present

## 2021-11-11 LAB — CBC WITH DIFFERENTIAL/PLATELET
Abs Immature Granulocytes: 0.05 10*3/uL (ref 0.00–0.07)
Basophils Absolute: 0 10*3/uL (ref 0.0–0.1)
Basophils Relative: 0 %
Eosinophils Absolute: 0.2 10*3/uL (ref 0.0–0.5)
Eosinophils Relative: 1 %
HCT: 36.9 % (ref 36.0–46.0)
Hemoglobin: 12.5 g/dL (ref 12.0–15.0)
Immature Granulocytes: 1 %
Lymphocytes Relative: 14 %
Lymphs Abs: 1.5 10*3/uL (ref 0.7–4.0)
MCH: 30.1 pg (ref 26.0–34.0)
MCHC: 33.9 g/dL (ref 30.0–36.0)
MCV: 88.9 fL (ref 80.0–100.0)
Monocytes Absolute: 1 10*3/uL (ref 0.1–1.0)
Monocytes Relative: 9 %
Neutro Abs: 7.9 10*3/uL — ABNORMAL HIGH (ref 1.7–7.7)
Neutrophils Relative %: 75 %
Platelets: 296 10*3/uL (ref 150–400)
RBC: 4.15 MIL/uL (ref 3.87–5.11)
RDW: 12.9 % (ref 11.5–15.5)
WBC: 10.6 10*3/uL — ABNORMAL HIGH (ref 4.0–10.5)
nRBC: 0 % (ref 0.0–0.2)

## 2021-11-11 LAB — BASIC METABOLIC PANEL
Anion gap: 8 (ref 5–15)
BUN: 12 mg/dL (ref 8–23)
CO2: 24 mmol/L (ref 22–32)
Calcium: 8.2 mg/dL — ABNORMAL LOW (ref 8.9–10.3)
Chloride: 97 mmol/L — ABNORMAL LOW (ref 98–111)
Creatinine, Ser: 1.21 mg/dL — ABNORMAL HIGH (ref 0.44–1.00)
GFR, Estimated: 42 mL/min — ABNORMAL LOW (ref >60)
Glucose, Bld: 221 mg/dL — ABNORMAL HIGH (ref 70–99)
Potassium: 3.7 mmol/L (ref 3.5–5.1)
Sodium: 129 mmol/L — ABNORMAL LOW (ref 135–145)

## 2021-11-11 LAB — ECHOCARDIOGRAM COMPLETE
AR max vel: 2.31 cm2
AV Area VTI: 2.29 cm2
AV Area mean vel: 2.22 cm2
AV Mean grad: 2 mmHg
AV Peak grad: 4 mmHg
Ao pk vel: 1 m/s
Area-P 1/2: 4.21 cm2
Height: 63 in
MV M vel: 6.42 m/s
MV Peak grad: 164.9 mmHg
Radius: 0.7 cm
S' Lateral: 2.6 cm
Weight: 3082.91 oz

## 2021-11-11 LAB — LIPID PANEL
Cholesterol: 203 mg/dL — ABNORMAL HIGH (ref 0–200)
HDL: 32 mg/dL — ABNORMAL LOW (ref >40)
LDL Cholesterol: 136 mg/dL — ABNORMAL HIGH (ref 0–99)
Total CHOL/HDL Ratio: 6.3 RATIO
Triglycerides: 175 mg/dL — ABNORMAL HIGH (ref <150)
VLDL: 35 mg/dL (ref 0–40)

## 2021-11-11 LAB — MRSA NEXT GEN BY PCR, NASAL: MRSA by PCR Next Gen: NOT DETECTED

## 2021-11-11 MED ORDER — ROSUVASTATIN CALCIUM 5 MG PO TABS
10.0000 mg | ORAL_TABLET | Freq: Every day | ORAL | Status: DC
  Administered 2021-11-11 – 2021-11-12 (×2): 10 mg via ORAL
  Filled 2021-11-11 (×2): qty 2

## 2021-11-11 MED ORDER — CHLORHEXIDINE GLUCONATE CLOTH 2 % EX PADS
6.0000 | MEDICATED_PAD | Freq: Every day | CUTANEOUS | Status: DC
  Administered 2021-11-11: 6 via TOPICAL

## 2021-11-11 NOTE — Progress Notes (Signed)
°  Echocardiogram 2D Echocardiogram has been performed.  Merrie Roof F 11/11/2021, 5:02 PM

## 2021-11-11 NOTE — Care Management (Signed)
°  Transition of Care [facility]) Screening Note   Patient Details  Name: Cathy Larsen Date of Birth: 1926-10-28   Transition of Care [facility]) CM/SW Contact:    Bethena Roys, RN Phone Number: 11/11/2021, 3:52 PM    Transition of Care Department [facility]) has reviewed the patient and benefits check submitted for [place]. Case Manager will follow for cost.

## 2021-11-11 NOTE — Progress Notes (Signed)
Progress Note  Patient Name: Cathy Larsen Date of Encounter: 11/11/2021  [facility] Cardiologist: Mertie Moores, MD   Subjective   Sitting up in chair, feeling well.  No residual chest pain no shortness of breath.  She is a very active 88 year old.  She states that she walks with a cane just for safety.  She still drives.  Every Saturday morning she goes and gets her hair done 11 miles away.  She is here with her grandson who lives with her.  Inpatient Medications    Scheduled Meds:  aspirin  324 mg Oral Once   aspirin  81 mg Oral Daily   carvedilol  3.125 mg Oral BID WC   Chlorhexidine Gluconate Cloth  6 each Topical Daily   enoxaparin (LOVENOX) injection  40 mg Subcutaneous Q24H   ezetimibe  10 mg Oral Daily   sodium chloride flush  3 mL Intravenous Q12H   ticagrelor  90 mg Oral BID   Continuous Infusions:  sodium chloride     sodium chloride     PRN Meds: sodium chloride, acetaminophen, ondansetron (ZOFRAN) IV, sodium chloride flush   Vital Signs    Vitals:   11/11/21 0400 11/11/21 0500 11/11/21 0600 11/11/21 0700  BP: (!) 123/54 118/60 (!) 119/53   Pulse: 78 71 73   Resp: 15 20 19    Temp: 97.8 F (36.6 C)   97.9 F (36.6 C)  TempSrc: Oral   Oral  SpO2: 96% 97% 96%   Weight:      Height:        Intake/Output Summary (Last 24 hours) at 11/11/2021 0849 Last data filed at 11/11/2021 0600 Gross per 24 hour  Intake 400.37 ml  Output 600 ml  Net -199.63 ml   Last 3 Weights 11/10/2021 11/10/2021 11/10/2021  Weight (lbs) 192 lb 10.9 oz 180 lb 180 lb  Weight (kg) 87.4 kg 81.647 kg 81.647 kg      Telemetry    Sinus rhythm with first-degree AV block.  9 beats of nonsustained ventricular tachycardia at 2:40 AM.- Personally Reviewed  ECG    11/11/2021-sinus rhythm 63 first-degree AV block subtle ST segment depression in 1 and aVL- Personally Reviewed  Physical Exam   GEN: No acute distress.  Sitting in chair Neck: No JVD Cardiac: RRR, no murmurs, rubs, or  gallops.  Respiratory: Clear to auscultation bilaterally. GI: Soft, nontender, non-distended  MS: No edema; No deformity.  Mild bruising noted at catheterization site right radial Neuro:  Nonfocal  Psych: Normal affect   Labs    High Sensitivity Troponin:   Recent Labs  Lab 11/10/21 1636 11/10/21 1836  TROPONINIHS 171* >24,000*     Chemistry Recent Labs  Lab 11/10/21 1636 11/10/21 1836  NA 134*  --   K 4.0  --   CL 99  --   CO2 24  --   GLUCOSE 249*  --   BUN 18  --   CREATININE 1.20* 1.13*  CALCIUM 9.1  --   PROT 6.7  --   ALBUMIN 3.7  --   AST 34  --   ALT 21  --   ALKPHOS 60  --   BILITOT 0.7  --   GFRNONAA 42* 45*  ANIONGAP 11  --     Lipids  Recent Labs  Lab 11/10/21 1636  CHOL 233*  TRIG 268*  HDL 38*  LDLCALC 141*  CHOLHDL 6.1    Hematology Recent Labs  Lab 11/10/21 1636  WBC 10.2  RBC 4.59  HGB 13.7  HCT 42.0  MCV 91.5  MCH 29.8  MCHC 32.6  RDW 12.7  PLT 310   Thyroid No results for input(s): TSH, FREET4 in the last 168 hours.  BNPNo results for input(s): BNP, PROBNP in the last 168 hours.  DDimer No results for input(s): DDIMER in the last 168 hours.   Radiology    CARDIAC CATHETERIZATION  Result Date: 11/10/2021   Ost RCA to Prox RCA lesion is 30% stenosed.   Prox RCA lesion is 100% stenosed.   Dist RCA lesion is 50% stenosed.   Prox Cx lesion is 90% stenosed.   Mid Cx lesion is 85% stenosed.   Dist Cx lesion is 100% stenosed.   Ost LAD to Prox LAD lesion is 60% stenosed.   Mid LAD-1 lesion is 80% stenosed.   Mid LAD-2 lesion is 50% stenosed.   A drug-eluting stent was successfully placed.   Post intervention, there is a 0% residual stenosis.   There is mild left ventricular systolic dysfunction.   LV end diastolic pressure is mildly elevated.   The left ventricular ejection fraction is 50-55% by visual estimate. Acute STEMI secondary to total proximal to mid occlusion of a large dominant RCA. Significant multivessel CAD with evidence for  coronary calcification in the left coronary system with 60% proximal LAD stenosis followed by focal 80% stenosis with 50% mid stenosis; severe left circumflex stenoses of 90% proximally followed by focal 85% and then total occlusion of the distal circumflex after a moderate sized marginal vessel and total occlusion of the proximal to mid RCA. Mild LV dysfunction with EF estimated approximately 50% with mid to basal inferior hypocontractility.  LVEDP 21 mmHg. Successful PCI to the totally occluded RCA with ultimate insertion of a 3.5 x 22 mm Medtronic Onyx Frontier stent postdilated to 3.55 mm with the 100% occlusion being reduced to 0%.  There is evidence for 50% stenosis in the distal RCA proximal to the PDA takeoff. RECOMMENDATION: DAPT for minimum of 1 year.  The patient has significant disease involving her left coronary system.  Will review with colleagues regarding medical therapy versus attempt at circumflex intervention.  The patient reportedly has an allergy to statin.  Will initiate Zetia.  However may consider rechallenge low-dose statin if myalgias were only an issue.  Optimal blood pressure control.    Cardiac Studies   Cath  Ost RCA to Prox RCA lesion is 30% stenosed.   Prox RCA lesion is 100% stenosed.   Dist RCA lesion is 50% stenosed.   Prox Cx lesion is 90% stenosed.   Mid Cx lesion is 85% stenosed.   Dist Cx lesion is 100% stenosed.   Ost LAD to Prox LAD lesion is 60% stenosed.   Mid LAD-1 lesion is 80% stenosed.   Mid LAD-2 lesion is 50% stenosed.   A drug-eluting stent was successfully placed.   Post intervention, there is a 0% residual stenosis.   There is mild left ventricular systolic dysfunction.   LV end diastolic pressure is mildly elevated.   The left ventricular ejection fraction is 50-55% by visual estimate.   Acute STEMI secondary to total proximal to mid occlusion of a large dominant RCA.   Significant multivessel CAD with evidence for coronary calcification in  the left coronary system with 60% proximal LAD stenosis followed by focal 80% stenosis with 50% mid stenosis; severe left circumflex stenoses of 90% proximally followed by focal 85% and then total occlusion of the distal  circumflex after a moderate sized marginal vessel and total occlusion of the proximal to mid RCA.   Mild LV dysfunction with EF estimated approximately 50% with mid to basal inferior hypocontractility.  LVEDP 21 mmHg.   Successful PCI to the totally occluded RCA with ultimate insertion of a 3.5 x 22 mm Medtronic Onyx Frontier stent postdilated to 3.55 mm with the 100% occlusion being reduced to 0%.  There is evidence for 50% stenosis in the distal RCA proximal to the PDA takeoff.   RECOMMENDATION: DAPT for minimum of 1 year.  The patient has significant disease involving her left coronary system.  Will review with colleagues regarding medical therapy versus attempt at circumflex intervention.  The patient reportedly has an allergy to statin.  Will initiate Zetia.  However may consider rechallenge low-dose statin if myalgias were only an issue.  Optimal blood pressure control.   Diagnostic Dominance: Right Intervention    Patient Profile     88 y.o. female here with inferior STEMI hyperlipidemia and diabetes hypertension patient of Dr. Damita Dunnings  Assessment & Plan    Inferior ST elevation myocardial infarction troponin greater than 24,000 - Symptoms were bilateral arm heaviness on 1/7 in the afternoon.  EKG findings inferior STEMI.  Precordial ST depression lateral ST depression. - Successful RCA proximal stent.  Residual LAD and circumflex disease as described above. - Cardiac rehab - Goal-directed medical therapy dual antiplatelet therapy -Creatinine 1.1 potassium 4.0 LDL 141 hemoglobin 13.7 hemoglobin A1c 8.1  First-degree AV block - We will be careful with carvedilol.  Currently on very low-dose 3.125 mg twice a day.  Hyperlipidemia - Previously had an issue with  statins apparently.  I talked to her about this and it was a few years ago she was having more leg pain with walking.  I think it is still a good idea to trial Crestor perhaps at a slightly lower dose, 10 mg.  Zetia was initiated as well.  Lets use this combination.  LDL goal less than 55 given her ACS.  Her grandson is present, agrees that we should try Crestor again.  Helps with plaque stabilization etc.  Diabetes and hypertension - Treated at home with losartan and hydrochlorothiazide.  BP is currently stable without these medications. no specific diabetic medication. - Could consider Jardiance  For questions or updates, please contact [place] Please consult [email or web address] for contact info under        Signed, Candee Furbish, MD  11/11/2021, 8:49 AM

## 2021-11-12 ENCOUNTER — Encounter (HOSPITAL_COMMUNITY): Payer: Self-pay | Admitting: Cardiovascular Disease

## 2021-11-12 ENCOUNTER — Telehealth (HOSPITAL_COMMUNITY): Payer: Self-pay | Admitting: Pharmacist

## 2021-11-12 ENCOUNTER — Other Ambulatory Visit (HOSPITAL_COMMUNITY): Payer: Self-pay

## 2021-11-12 DIAGNOSIS — I2111 ST elevation (STEMI) myocardial infarction involving right coronary artery: Secondary | ICD-10-CM | POA: Diagnosis not present

## 2021-11-12 MED ORDER — LOSARTAN POTASSIUM 25 MG PO TABS: 12.5000 mg | ORAL_TABLET | Freq: Every day | ORAL | Status: DC

## 2021-11-12 MED ORDER — NITROGLYCERIN 0.4 MG SL SUBL: 0.4000 mg | SUBLINGUAL_TABLET | SUBLINGUAL | Status: DC | PRN

## 2021-11-12 MED ORDER — CLOPIDOGREL BISULFATE 300 MG PO TABS
600.0000 mg | ORAL_TABLET | Freq: Once | ORAL | Status: AC
  Administered 2021-11-12: 600 mg via ORAL
  Filled 2021-11-12: qty 2

## 2021-11-12 MED ORDER — EMPAGLIFLOZIN 10 MG PO TABS
10.0000 mg | ORAL_TABLET | Freq: Every day | ORAL | Status: DC
  Administered 2021-11-12: 10 mg via ORAL
  Filled 2021-11-12: qty 1

## 2021-11-12 MED ORDER — CARVEDILOL 12.5 MG PO TABS
12.5000 mg | ORAL_TABLET | Freq: Two times a day (BID) | ORAL | 11 refills | Status: DC
  Filled 2021-11-12: qty 60, 30d supply, fill #0

## 2021-11-12 MED ORDER — LOSARTAN POTASSIUM 25 MG PO TABS: 25.0000 mg | ORAL_TABLET | Freq: Every day | ORAL | Status: DC

## 2021-11-12 MED ORDER — CLOPIDOGREL BISULFATE 75 MG PO TABS: 75.0000 mg | ORAL_TABLET | Freq: Every day | ORAL | Status: DC

## 2021-11-12 MED ORDER — EMPAGLIFLOZIN 10 MG PO TABS
10.0000 mg | ORAL_TABLET | Freq: Every day | ORAL | 11 refills | Status: DC
  Filled 2021-11-12: qty 30, 30d supply, fill #0

## 2021-11-12 MED ORDER — ASPIRIN EC 81 MG PO TBEC
81.0000 mg | DELAYED_RELEASE_TABLET | Freq: Every day | ORAL | 3 refills | Status: DC
  Filled 2021-11-12: qty 90, 90d supply, fill #0

## 2021-11-12 MED ORDER — CLOPIDOGREL BISULFATE 75 MG PO TABS
75.0000 mg | ORAL_TABLET | Freq: Every day | ORAL | 3 refills | Status: DC
  Filled 2021-11-12: qty 90, 90d supply, fill #0

## 2021-11-12 MED ORDER — EZETIMIBE 10 MG PO TABS
10.0000 mg | ORAL_TABLET | Freq: Every day | ORAL | 11 refills | Status: DC
  Filled 2021-11-12: qty 30, 30d supply, fill #0

## 2021-11-12 MED ORDER — CARVEDILOL 12.5 MG PO TABS
12.5000 mg | ORAL_TABLET | Freq: Two times a day (BID) | ORAL | Status: DC
  Administered 2021-11-12: 12.5 mg via ORAL
  Filled 2021-11-12: qty 1

## 2021-11-12 MED ORDER — ROSUVASTATIN CALCIUM 10 MG PO TABS
10.0000 mg | ORAL_TABLET | Freq: Every day | ORAL | 11 refills | Status: DC
  Filled 2021-11-12: qty 30, 30d supply, fill #0

## 2021-11-12 NOTE — Discharge Summary (Addendum)
Discharge Summary    Patient ID: Cathy Larsen MRN: [id number]; DOB: 1926/07/21  Admit date: 11/10/2021 Discharge date: 11/12/2021  PCP:  Tonia Ghent, MD   [facility] Providers Cardiologist:  Mertie Moores, MD   {   Discharge Diagnoses    Principal Problem:   ST elevation myocardial infarction involving right coronary artery [facility]) Active Problems:   HLD (hyperlipidemia)   Essential hypertension   DM    Diagnostic Studies/Procedures    Echo 11/11/21  1. Left ventricular ejection fraction, by estimation, is 60 to 65%. The  left ventricle has normal function. The left ventricle demonstrates  regional wall motion abnormalities (see scoring diagram/findings for  description). There is mild concentric left  ventricular hypertrophy. Indeterminate diastolic filling due to E-A  fusion. There is mild hypokinesis of the left ventricular, basal inferior  wall.   2. Right ventricular systolic function is normal. The right ventricular  size is normal. There is moderately elevated pulmonary artery systolic  pressure. The estimated right ventricular systolic pressure is 62.2 mmHg.   3. Left atrial size was moderately dilated.   4. Right atrial size was mild to moderately dilated.   5. The mitral valve is grossly normal. Moderate mitral valve  regurgitation.   6. The aortic valve is grossly normal. There is mild calcification of the  aortic valve. Aortic valve regurgitation is trivial. No aortic stenosis is  present.   7. Aortic dilatation noted. There is borderline dilatation of the  ascending aorta, measuring 38 mm.   8. The inferior vena cava is dilated in size with <50% respiratory  variability, suggesting right atrial pressure of 15 mmHg.   Comparison(s): Changes from prior study are noted.   Cath 11/10/21  Ost RCA to Prox RCA lesion is 30% stenosed.   Prox RCA lesion is 100% stenosed.   Dist RCA lesion is 50% stenosed.   Prox Cx lesion is 90% stenosed.   Mid Cx lesion  is 85% stenosed.   Dist Cx lesion is 100% stenosed.   Ost LAD to Prox LAD lesion is 60% stenosed.   Mid LAD-1 lesion is 80% stenosed.   Mid LAD-2 lesion is 50% stenosed.   A drug-eluting stent was successfully placed.   Post intervention, there is a 0% residual stenosis.   There is mild left ventricular systolic dysfunction.   LV end diastolic pressure is mildly elevated.   The left ventricular ejection fraction is 50-55% by visual estimate.   Acute STEMI secondary to total proximal to mid occlusion of a large dominant RCA.   Significant multivessel CAD with evidence for coronary calcification in the left coronary system with 60% proximal LAD stenosis followed by focal 80% stenosis with 50% mid stenosis; severe left circumflex stenoses of 90% proximally followed by focal 85% and then total occlusion of the distal circumflex after a moderate sized marginal vessel and total occlusion of the proximal to mid RCA.   Mild LV dysfunction with EF estimated approximately 50% with mid to basal inferior hypocontractility.  LVEDP 21 mmHg.   Successful PCI to the totally occluded RCA with ultimate insertion of a 3.5 x 22 mm Medtronic Onyx Frontier stent postdilated to 3.55 mm with the 100% occlusion being reduced to 0%.  There is evidence for 50% stenosis in the distal RCA proximal to the PDA takeoff.   RECOMMENDATION: DAPT for minimum of 1 year.  The patient has significant disease involving her left coronary system.  Will review with colleagues regarding medical therapy  versus attempt at circumflex intervention.  The patient reportedly has an allergy to statin.  Will initiate Zetia.  However may consider rechallenge low-dose statin if myalgias were only an issue.  Optimal blood pressure control.   Diagnostic Dominance: Right Intervention       History of Present Illness     Cathy Larsen is a 88 y.o. female with Hx of HTN, HLD, and DM presented as code STEMI.   Established care with Dr. Acie Fredrickson  09/2019 for chest pain which was concerning for anginal equivalent. Did not recommended ischemic evaluation due to age and frail. BP was elevated. Adjusted antihypertensive.   She had acute onset chest pain with bilateral arm discomfort. Her pain persisted and EMS was called. ECG showed inferolateral findings consistent with STEMI.  A code STEMI was activated.  She was transported to [facility] ER.  In the emergency room she was still complaining of bilateral arm heaviness.  She was treated with 4000 units of heparin and aspirin.  She was transported to the Cath Lab for emergent cardiac catheterization.  Hospital Course     Consultants: None   Inferior ST elevation myocardial infarction: Cath showed multivessel CAD as above. S/p successful PCI to the totally occluded RCA. She has residual disease which will be treated medically given stable LVEF at 60-65%. Continue DAPT with ASA and Plavix (stopped brilinta due to cost). No chest pain. Ambulated well. Added Coreg and Crestor.   HLD: - 11/11/2021: Cholesterol 203; HDL 32; LDL Cholesterol 136; Triglycerides 175; VLDL 35  - Hx of statin intolerance - Added Crestor and Zetia - PCSK9 inhibitor as outpatient if unable to tolerate or LDL not at goal  HTN: -Will resume Losartan. Stop HCTZ and supplemental Kdur - Added Coreg this admission   DM: -not on any meds at home - HgbA1c 8.1 this admission - Added Jardiance - Follow up with PCP  Did the patient have an acute coronary syndrome (MI, NSTEMI, STEMI, etc) this admission?:  Yes                               AHA/ACC Clinical Performance & Quality Measures: Aspirin prescribed? - Yes ADP Receptor Inhibitor (Plavix/Clopidogrel, Brilinta/Ticagrelor or Effient/Prasugrel) prescribed (includes medically managed patients)? - Yes Beta Blocker prescribed? - Yes High Intensity Statin (Lipitor 40-80mg  or Crestor 20-40mg ) prescribed? - No - Statin intolerance but agreed to try low dose  EF assessed during THIS  hospitalization? - Yes For EF <40%, was ACEI/ARB prescribed? - Yes For EF <40%, Aldosterone Antagonist (Spironolactone or Eplerenone) prescribed? - Not Applicable (EF >/= 16%) Cardiac Rehab Phase II ordered (including medically managed patients)? - Yes     The patient will be scheduled for a TOC follow up appointment in 14 days.  A message has been sent to the [facility] and Scheduling Pool at the office where the patient should be seen for follow up.   Discharge Vitals Blood pressure (!) 160/74, pulse 88, temperature 98.2 F (36.8 C), temperature source Oral, resp. rate (!) 22, height 5\' 3"  (1.6 m), weight 87.4 kg, SpO2 96 %.  Filed Weights   11/10/21 1653 11/10/21 1709 11/10/21 1756  Weight: 81.6 kg 81.6 kg 87.4 kg    Labs & Radiologic Studies    CBC Recent Labs    11/10/21 1636 11/11/21 1831  WBC 10.2 10.6*  NEUTROABS 7.1 7.9*  HGB 13.7 12.5  HCT 42.0 36.9  MCV 91.5 88.9  PLT 310 675   Basic Metabolic Panel Recent Labs    11/10/21 1636 11/10/21 1836 11/11/21 1831  NA 134*  --  129*  K 4.0  --  3.7  CL 99  --  97*  CO2 24  --  24  GLUCOSE 249*  --  221*  BUN 18  --  12  CREATININE 1.20* 1.13* 1.21*  CALCIUM 9.1  --  8.2*   Liver Function Tests Recent Labs    11/10/21 1636  AST 34  ALT 21  ALKPHOS 60  BILITOT 0.7  PROT 6.7  ALBUMIN 3.7   High Sensitivity Troponin:   Recent Labs  Lab 11/10/21 1636 11/10/21 1836  TROPONINIHS 171* >24,000*    Hemoglobin A1C Recent Labs    11/10/21 1636  HGBA1C 8.1*   Fasting Lipid Panel Recent Labs    11/11/21 1831  CHOL 203*  HDL 32*  LDLCALC 136*  TRIG 175*  CHOLHDL 6.3   _____________  CARDIAC CATHETERIZATION  Result Date: 11/10/2021   Ost RCA to Prox RCA lesion is 30% stenosed.   Prox RCA lesion is 100% stenosed.   Dist RCA lesion is 50% stenosed.   Prox Cx lesion is 90% stenosed.   Mid Cx lesion is 85% stenosed.   Dist Cx lesion is 100% stenosed.   Ost LAD to Prox LAD lesion is 60% stenosed.   Mid  LAD-1 lesion is 80% stenosed.   Mid LAD-2 lesion is 50% stenosed.   A drug-eluting stent was successfully placed.   Post intervention, there is a 0% residual stenosis.   There is mild left ventricular systolic dysfunction.   LV end diastolic pressure is mildly elevated.   The left ventricular ejection fraction is 50-55% by visual estimate. Acute STEMI secondary to total proximal to mid occlusion of a large dominant RCA. Significant multivessel CAD with evidence for coronary calcification in the left coronary system with 60% proximal LAD stenosis followed by focal 80% stenosis with 50% mid stenosis; severe left circumflex stenoses of 90% proximally followed by focal 85% and then total occlusion of the distal circumflex after a moderate sized marginal vessel and total occlusion of the proximal to mid RCA. Mild LV dysfunction with EF estimated approximately 50% with mid to basal inferior hypocontractility.  LVEDP 21 mmHg. Successful PCI to the totally occluded RCA with ultimate insertion of a 3.5 x 22 mm Medtronic Onyx Frontier stent postdilated to 3.55 mm with the 100% occlusion being reduced to 0%.  There is evidence for 50% stenosis in the distal RCA proximal to the PDA takeoff. RECOMMENDATION: DAPT for minimum of 1 year.  The patient has significant disease involving her left coronary system.  Will review with colleagues regarding medical therapy versus attempt at circumflex intervention.  The patient reportedly has an allergy to statin.  Will initiate Zetia.  However may consider rechallenge low-dose statin if myalgias were only an issue.  Optimal blood pressure control.   ECHOCARDIOGRAM COMPLETE  Result Date: 11/11/2021    ECHOCARDIOGRAM REPORT   Patient Name:   LASONIA CASINO Date of Exam: 11/11/2021 Medical Rec #:  [id number]      Height:       63.0 in Accession #:    [id number]     Weight:       192.7 lb Date of Birth:  July 14, 1926       BSA:          1.903 m Patient Age:    32 years  BP:           136/63  mmHg Patient Gender: F              HR:           82 bpm. Exam Location:  Inpatient Procedure: 2D Echo, Cardiac Doppler and Color Doppler Indications:    Acute myocardial infarction, unspecified I21.9  History:        Patient has prior history of Echocardiogram examinations, most                 recent 03/05/2017. CAD and Acute MI; Cardiac cath yesterday.  Sonographer:    Merrie Roof RDCS Referring Phys: [place]  1. Left ventricular ejection fraction, by estimation, is 60 to 65%. The left ventricle has normal function. The left ventricle demonstrates regional wall motion abnormalities (see scoring diagram/findings for description). There is mild concentric left ventricular hypertrophy. Indeterminate diastolic filling due to E-A fusion. There is mild hypokinesis of the left ventricular, basal inferior wall.  2. Right ventricular systolic function is normal. The right ventricular size is normal. There is moderately elevated pulmonary artery systolic pressure. The estimated right ventricular systolic pressure is 02.4 mmHg.  3. Left atrial size was moderately dilated.  4. Right atrial size was mild to moderately dilated.  5. The mitral valve is grossly normal. Moderate mitral valve regurgitation.  6. The aortic valve is grossly normal. There is mild calcification of the aortic valve. Aortic valve regurgitation is trivial. No aortic stenosis is present.  7. Aortic dilatation noted. There is borderline dilatation of the ascending aorta, measuring 38 mm.  8. The inferior vena cava is dilated in size with <50% respiratory variability, suggesting right atrial pressure of 15 mmHg. Comparison(s): Changes from prior study are noted. Conclusion(s)/Recommendation(s): Mild hypokinesis of basal inferior wall with overall preserved LVEF. Moderate MR. FINDINGS  Left Ventricle: Left ventricular ejection fraction, by estimation, is 60 to 65%. The left ventricle has normal function. The left ventricle demonstrates  regional wall motion abnormalities. Mild hypokinesis of the left ventricular, basal inferior wall. The left ventricular internal cavity size was normal in size. There is mild concentric left ventricular hypertrophy. Indeterminate diastolic filling due to E-A fusion. Right Ventricle: The right ventricular size is normal. No increase in right ventricular wall thickness. Right ventricular systolic function is normal. There is moderately elevated pulmonary artery systolic pressure. The tricuspid regurgitant velocity is 2.85 m/s, and with an assumed right atrial pressure of 15 mmHg, the estimated right ventricular systolic pressure is 09.7 mmHg. Left Atrium: Left atrial size was moderately dilated. Right Atrium: Right atrial size was mild to moderately dilated. Pericardium: There is no evidence of pericardial effusion. Presence of epicardial fat layer. Mitral Valve: The mitral valve is grossly normal. Mild to moderate mitral annular calcification. Moderate mitral valve regurgitation. Tricuspid Valve: The tricuspid valve is normal in structure. Tricuspid valve regurgitation is mild . No evidence of tricuspid stenosis. Aortic Valve: The aortic valve is grossly normal. There is mild calcification of the aortic valve. Aortic valve regurgitation is trivial. No aortic stenosis is present. Aortic valve mean gradient measures 2.0 mmHg. Aortic valve peak gradient measures 4.0  mmHg. Aortic valve area, by VTI measures 2.29 cm. Pulmonic Valve: The pulmonic valve was not well visualized. Pulmonic valve regurgitation is trivial. No evidence of pulmonic stenosis. Aorta: Aortic dilatation noted. There is borderline dilatation of the ascending aorta, measuring 38 mm. Venous: The inferior vena cava is dilated in size with  less than 50% respiratory variability, suggesting right atrial pressure of 15 mmHg. IAS/Shunts: The atrial septum is grossly normal.  LEFT VENTRICLE PLAX 2D LVIDd:         3.90 cm LVIDs:         2.60 cm LV PW:          1.30 cm LV IVS:        1.20 cm LVOT diam:     1.80 cm LV SV:         44 LV SV Index:   23 LVOT Area:     2.54 cm  RIGHT VENTRICLE          IVC RV Basal diam:  3.30 cm  IVC diam: 2.40 cm LEFT ATRIUM             Index        RIGHT ATRIUM           Index LA diam:        4.30 cm 2.26 cm/m   RA Area:     17.50 cm LA Vol (A2C):   95.4 ml 50.12 ml/m  RA Volume:   40.30 ml  21.17 ml/m LA Vol (A4C):   73.0 ml 38.36 ml/m LA Biplane Vol: 84.8 ml 44.56 ml/m  AORTIC VALVE AV Area (Vmax):    2.31 cm AV Area (Vmean):   2.22 cm AV Area (VTI):     2.29 cm AV Vmax:           99.50 cm/s AV Vmean:          66.600 cm/s AV VTI:            0.192 m AV Peak Grad:      4.0 mmHg AV Mean Grad:      2.0 mmHg LVOT Vmax:         90.30 cm/s LVOT Vmean:        58.100 cm/s LVOT VTI:          0.173 m LVOT/AV VTI ratio: 0.90  AORTA Ao Root diam: 3.70 cm Ao Asc diam:  3.80 cm MITRAL VALVE                  TRICUSPID VALVE MV Area (PHT): 4.21 cm       TR Peak grad:   32.5 mmHg MV Decel Time: 180 msec       TR Vmax:        285.00 cm/s MR Peak grad:    164.9 mmHg MR Mean grad:    101.0 mmHg   SHUNTS MR Vmax:         642.00 cm/s  Systemic VTI:  0.17 m MR Vmean:        472.0 cm/s   Systemic Diam: 1.80 cm MR PISA:         3.08 cm MR PISA Eff ROA: 17 mm MR PISA Radius:  0.70 cm MV E velocity: 114.00 cm/s MV A velocity: 127.00 cm/s MV E/A ratio:  0.90 Buford Dresser MD Electronically signed by Buford Dresser MD Signature Date/Time: 11/11/2021/6:43:15 PM    Final    Disposition   Pt is being discharged home today in good condition.  Follow-up Plans & Appointments     Follow-up Information     Loel Dubonnet, NP Follow up on 11/26/2021.   Specialty: Cardiology Why: @ 10.05am for hospital follow up with Dr. Elmarie Shiley NP/PA. Please arrive 15 minutes early Contact information: [address] [place] [address] [phone]  Discharge Instructions     Diet - low sodium heart healthy    Complete by: As directed    Discharge instructions   Complete by: As directed    No driving for 2 weeks. No lifting over 10 lbs for 4 weeks. No sexual activity for 4 weeks. You may not return to work until cleared by your cardiologist. Keep procedure site clean & dry. If you notice increased pain, swelling, bleeding or pus, call/return!  You may shower, but no soaking baths/hot tubs/pools for 1 week.   Increase activity slowly   Complete by: As directed        Discharge Medications   Allergies as of 11/12/2021       Reactions   Statins Other (See Comments)   Myalgias.    Keflex [cephalexin] Other (See Comments)   Migraine   Requip [ropinirole Hcl] Other (See Comments)   headache        Medication List     STOP taking these medications    aspirin 325 MG tablet Replaced by: aspirin EC 81 MG tablet   hydrochlorothiazide 25 MG tablet Commonly known as: HYDRODIURIL   potassium chloride 10 MEQ tablet Commonly known as: KLOR-CON       TAKE these medications    aspirin EC 81 MG tablet Take 1 tablet (81 mg total) by mouth daily. Swallow whole. Replaces: aspirin 325 MG tablet   carvedilol 12.5 MG tablet Commonly known as: COREG Take 1 tablet (12.5 mg total) by mouth 2 (two) times daily with a meal.   clopidogrel 75 MG tablet Commonly known as: PLAVIX Take 1 tablet (75 mg total) by mouth daily. Start taking on: November 13, 2021   empagliflozin 10 MG Tabs tablet Commonly known as: JARDIANCE Take 1 tablet (10 mg total) by mouth daily. Start taking on: November 13, 2021   ezetimibe 10 MG tablet Commonly known as: ZETIA Take 1 tablet (10 mg total) by mouth daily. Start taking on: November 13, 2021   famotidine 10 MG tablet Commonly known as: PEPCID Take 10 mg by mouth once.   Fish Oil 1200 MG Caps Take 1,200 mg by mouth daily.   Ginger Root 500 MG Caps Take 500 mg by mouth at bedtime.   glucose blood test strip Commonly known as: ONE TOUCH ULTRA TEST USE  DAILY TO CHECK SUGAR.  Dx 11.9.   losartan 50 MG tablet Commonly known as: COZAAR Take 1 tablet (50 mg total) by mouth daily.   Magnesium 200 MG Tabs Take 200 mg by mouth at bedtime.   multivitamin with minerals Tabs tablet Take 1 tablet by mouth at bedtime.   nitroGLYCERIN 0.4 MG SL tablet Commonly known as: NITROSTAT Place 1 tablet (0.4 mg total) under the tongue every 5 (five) minutes as needed for chest pain (max 3 tabs in 15 minutes).   OMEGA-3-6-9 PO Take 2,400 mg by mouth 3 (three) times a week. liquid   rosuvastatin 10 MG tablet Commonly known as: CRESTOR Take 1 tablet (10 mg total) by mouth daily. Start taking on: November 13, 2021   Theratears 0.25 % Soln Generic drug: Carboxymethylcellulose Sodium Place 1 drop into both eyes at bedtime.   Turmeric 500 MG Caps Take 500 mg by mouth at bedtime.        Outstanding Labs/Studies   Consider OP f/u labs 6-8 weeks given statin initiation this admission.   Duration of Discharge Encounter   Greater than 30 minutes including physician time.  Signed, Leanor Kail, PA  11/12/2021, 10:47 AM    ATTENDING ATTESTATION:  After conducting a review of all available clinical information with the care team, interviewing the patient, and performing a physical exam, I agree with the findings and plan described in this note.   GEN: No acute distress.   Cardiac: RRR, no murmurs, rubs, or gallops.  Respiratory: Clear to auscultation bilaterally. GI: Soft, nontender, non-distended  MS: No edema; No deformity. Neuro:  Nonfocal   Patient doing well after treatment for inferior STEMI.  Patient does have moderate residual disease.  Her ejection fraction is preserved.  We will increase Coreg to 12.5 mg.  We will resume losartan and start Jardiance.  The patient had very limited atrial fibrillation less than 48 hours duration.  We will monitor for now with no initiation of DOAC.  The patient does have bilateral atrial enlargement  and so does have substrate for atrial fibrillation.  She will be discharged today with cardiology follow-up and will check BMP at that time as well.  Lenna Sciara, MD Pager [phone]

## 2021-11-12 NOTE — Discharge Instructions (Signed)

## 2021-11-12 NOTE — TOC Benefit Eligibility Note (Addendum)
Patient Teacher, English as a foreign language completed.    The patient is currently admitted and upon discharge could be taking Brilinta 90 mg.  The current 30 day co-pay is, $100.00.   The patient is currently admitted and upon discharge could be taking Jardiance 10 mg.  The current 30 day co-pay is, $47.00.   The patient is insured through [place], [place] Patient Advocate Specialist [place] Patient Advocate Team Direct Number: [phone]  Fax: [phone]

## 2021-11-12 NOTE — Telephone Encounter (Signed)
Hello,  The Pharmacy team is conducting a discharge transitions of care quality improvement initiative. The recommendations below are for your consideration.    Cathy Larsen is a 88 y.o. female (MRN: [id number], DOB: 03/11/1926) who was recently hospitalized on 11/10/2021 for STEMI. They are anticipated to visit your clinic for post-discharge follow-up and may benefit from assistance with medication initiation and/or access.     Please consider the following therapy recommendations at follow-up appointment below:   -She is statin intolerant (myalgias) and crestor was added. Follow for tolerance and increase to high intensity if able   Other relevant medication issues from their recent admission include: -She was transitioned from Brilinta to plavix due to cost (~ $100) -Jardiance was added and the cost should be $47 per month -Hydrochlorothiazide and potassium chloride was discontinued  We appreciate your assistance with the implementation of these recommendations. Please let us know if there is anything we can help you with at this time.       Thank you, Hildred Laser, PharmD Clinical Pharmacist **Pharmacist phone directory can now be found on [email or web address] ([email or web address]).  Listed under [place].

## 2021-11-12 NOTE — Evaluation (Signed)
Occupational Therapy Evaluation Patient Details Name: Cathy Larsen MRN: [id number] DOB: 09/03/1926 Today's Date: 11/12/2021   History of Present Illness 88 yo female presenting 1/7 with BUE heaviness and fatigue. Work up revealed STEMI and pt was taken for cardiac cath with PCI to RCA. PMH includes: HTN, CAD, DM II, HLD, and gout.   Clinical Impression   PTA, pt was living with her grandson and was independent; using a SPC for mobility. Currently, pt performing ADLs and functional mobility at Mod I level with increased time. Provided education and handout on energy conservation techniques for ADLs and IADLs to decrease fatigue. Answered all pt questions. Recommend dc home once medically stable per physician. All acute OT needs met and will sign off. Thank you.      Recommendations for follow up therapy are one component of a multi-disciplinary discharge planning process, led by the attending physician.  Recommendations may be updated based on patient status, additional functional criteria and insurance authorization.   Follow Up Recommendations  No OT follow up    Assistance Recommended at Discharge PRN  Patient can return home with the following A little help with walking and/or transfers;Assistance with cooking/housework    Functional Status Assessment  Patient has had a recent decline in their functional status and demonstrates the ability to make significant improvements in function in a reasonable and predictable amount of time.  Equipment Recommendations  None recommended by OT    Recommendations for Other Services       Precautions / Restrictions Precautions Precautions: Fall Restrictions Weight Bearing Restrictions: No      Mobility Bed Mobility Overal bed mobility: Independent                  Transfers Overall transfer level: Modified independent Equipment used: Rolling walker (2 wheels)               General transfer comment: pt able to complete  without assist, standing on arrival of PT      Balance Overall balance assessment: Needs assistance Sitting-balance support: No upper extremity supported Sitting balance-Leahy Scale: Good     Standing balance support: Single extremity supported Standing balance-Leahy Scale: Fair Standing balance comment: static stand without UE support, minG with single UE support for gait                           ADL either performed or assessed with clinical judgement   ADL Overall ADL's : Modified independent                                       General ADL Comments: Provided education on comepensatory techniques to decrease fatigue (such as bringing BLEs up when dressing LB) and energy conservation for ADLs and IADLs. Pt verbalized understand     Vision         Perception     Praxis      Pertinent Vitals/Pain Pain Assessment: No/denies pain     Hand Dominance Right   Extremity/Trunk Assessment Upper Extremity Assessment Upper Extremity Assessment: RUE deficits/detail RUE Deficits / Details: pt with some baseline deficits in R hand, unable to fully clasp grip   Lower Extremity Assessment Lower Extremity Assessment: Defer to PT evaluation   Cervical / Trunk Assessment Cervical / Trunk Assessment: Kyphotic   Communication Communication Communication: No difficulties   Cognition Arousal/Alertness: Awake/alert  Behavior During Therapy: WFL for tasks assessed/performed Overall Cognitive Status: Within Functional Limits for tasks assessed                                 General Comments: pt with slight delay in responses but able to answer all home information acurately     General Comments  VSS on RA. Grandson present and also verablized understanding of education    Exercises     Shoulder Instructions      Home Living Family/patient expects to be discharged to:: Private residence Living Arrangements: Children Available Help  at Discharge: Family;Available 24 hours/day Type of Home: House Home Access: Stairs to enter [employer] of Steps: 2 Entrance Stairs-Rails:  (column she holds) Home Layout: One level     Bathroom Shower/Tub: Occupational psychologist: Standard     Home Equipment: Conservation officer, nature (2 wheels);Cane - quad;Cane - single point;Grab bars - tub/shower;Shower seat          Prior Functioning/Environment Prior Level of Function : Independent/Modified Independent;Driving             Mobility Comments: pt reports no falls, uses cane in the home[place] ADLs Comments: pt reports full independence in ADLs, grandon does IADLs        OT Problem List: Decreased activity tolerance      OT Treatment/Interventions:      OT Goals(Current goals can be found in the care plan section) Acute Rehab OT Goals Patient Stated Goal: Go home OT Goal Formulation: All assessment and education complete, DC therapy  OT Frequency:      Co-evaluation              AM-PAC OT "6 Clicks" Daily Activity     Outcome Measure Help from another person eating meals?: None Help from another person taking care of personal grooming?: None Help from another person toileting, which includes using toliet, bedpan, or urinal?: None Help from another person bathing (including washing, rinsing, drying)?: None Help from another person to put on and taking off regular upper body clothing?: None Help from another person to put on and taking off regular lower body clothing?: None 6 Click Score: 24   End of Session Nurse Communication: Mobility status  Activity Tolerance: Patient tolerated treatment well Patient left: in chair;with nursing/sitter in room  OT Visit Diagnosis: Unsteadiness on feet (R26.81);Other abnormalities of gait and mobility (R26.89);Muscle weakness (generalized) (M62.81)                Time: 7972-8206 OT Time Calculation (min): 12 min Charges:   OT General Charges $OT Visit: 1 Visit OT Evaluation $OT Eval Low Complexity: 1 Low  Aadit Hagood MSOT, OTR/L Acute Rehab Pager: [phone] Office: [place] 11/12/2021, 12:22 PM

## 2021-11-12 NOTE — Evaluation (Signed)
Physical Therapy Evaluation Patient Details Name: Cathy Larsen MRN: [id number] DOB: Dec 31, 1925 Today's Date: 11/12/2021  History of Present Illness  The pt is a 88 yo female presenting 1/7 with BUE heaviness and fatigue. Work up revealed STEMI and pt was taken for cardiac cath with PCI to RCA. PMH includes: HTN, CAD, DM II, HLD, and gout.   Clinical Impression  Pt OOB in chair upon arrival of PT, agreeable to evaluation at this time. Prior to admission the pt was independent with use of SPC for mobility, reports she was independent with ADLs, driving short distances, and living with her grandson who is able to assist with IADLs. The pt was able to demo god independence with sit-stand transfers, and was able to complete ~200 ft ambulation with use of RW and supervision and then single UE support and minG. The pt does use slowed gait speed and demos slight increase in lateral sway, but reports she is close to her baseline mobility at this time, only feeling a little weak as a result of being more sedentary here than she is at home. All VSS with mobility, HR elevation to 113bpm. The pt and her family were educated on progressive return to ambulation and walking program, both expressed understanding. The pt will continue to benefit from skilled PT acutely, but is safe to return home with family once medically cleared.   Gait Speed: 0.13m/s using RW and with supervision (gait speed <0.82m/s indicates increased risk of falls and dependence in ADLs)     Recommendations for follow up therapy are one component of a multi-disciplinary discharge planning process, led by the attending physician.  Recommendations may be updated based on patient status, additional functional criteria and insurance authorization.  Follow Up Recommendations No PT follow up    Assistance Recommended at Discharge Intermittent Supervision/Assistance  Patient can return home with the following  Assistance with  cooking/housework;Assist for transportation    Equipment Recommendations None recommended by PT (pt has needed DME)  Recommendations for Other Services       Functional Status Assessment Patient has had a recent decline in their functional status and demonstrates the ability to make significant improvements in function in a reasonable and predictable amount of time.     Precautions / Restrictions Precautions Precautions: Fall Restrictions Weight Bearing Restrictions: No      Mobility  Bed Mobility Overal bed mobility: Independent                  Transfers Overall transfer level: Modified independent Equipment used: Rolling walker (2 wheels)               General transfer comment: pt able to complete without assist, standing on arrival of PT    Ambulation/Gait Ambulation/Gait assistance: Min guard Gait Distance (Feet): 190 Feet (+ 190 ft) Assistive device: Rolling walker (2 wheels);1 person hand held assist Gait Pattern/deviations: Step-through pattern;Decreased stride length Gait velocity: 0.34 m/s Gait velocity interpretation: <1.31 ft/sec, indicative of household ambulator   General Gait Details: pt with slight sway and slowed speed, but no overt LOB. able to complete with RW and supervsion or HHA to mimic SPC and minG    Balance Overall balance assessment: Needs assistance Sitting-balance support: No upper extremity supported Sitting balance-Leahy Scale: Good     Standing balance support: Single extremity supported Standing balance-Leahy Scale: Fair Standing balance comment: static stand without UE support, minG with single UE support for gait  Pertinent Vitals/Pain Pain Assessment: No/denies pain    Home Living Family/patient expects to be discharged to:: Private residence Living Arrangements: Children Available Help at Discharge: Family;Available 24 hours/day Type of Home: House Home Access: Stairs to  enter Entrance Stairs-Rails:  (column she holds) Technical brewer of Steps: 2   Home Layout: One level Home Equipment: Conservation officer, nature (2 wheels);Cane - quad;Cane - single point;Grab bars - tub/shower;Shower seat      Prior Function Prior Level of Function : Independent/Modified Independent;Driving             Mobility Comments: pt reports no falls, uses cane in the home[place] ADLs Comments: pt reports full independence in ADLs, grandon does IADLs     Hand Dominance   Dominant Hand: Right    Extremity/Trunk Assessment   Upper Extremity Assessment Upper Extremity Assessment: Defer to OT evaluation;RUE deficits/detail RUE Deficits / Details: pt with some baseline deficits in R hand, unable to fully clasp grip    Lower Extremity Assessment Lower Extremity Assessment: Generalized weakness;Overall WFL for tasks assessed (p with mild weakness bilaterally, but able to complete sit-stand without issue)    Cervical / Trunk Assessment Cervical / Trunk Assessment: Kyphotic  Communication   Communication: No difficulties  Cognition Arousal/Alertness: Awake/alert Behavior During Therapy: WFL for tasks assessed/performed Overall Cognitive Status: Within Functional Limits for tasks assessed                                 General Comments: pt with slight delay in responses but able to answer all home information acurately        General Comments General comments (skin integrity, edema, etc.): VSS on RA. HR to 113 after ~400 ft walking. 3/4 DOE bu SpO2 95%    Exercises     Assessment/Plan    PT Assessment Patient needs continued PT services  PT Problem List Decreased strength;Decreased activity tolerance;Decreased balance;Decreased mobility;Cardiopulmonary status limiting activity       PT Treatment Interventions DME instruction;Stair training;Gait training;Functional mobility training;Therapeutic exercise;Therapeutic  activities;Balance training;Patient/family education    PT Goals (Current goals can be found in the Care Plan section)  Acute Rehab PT Goals Patient Stated Goal: return home today PT Goal Formulation: With patient Time For Goal Achievement: 11/26/21 Potential to Achieve Goals: Good    Frequency Min 3X/week        AM-PAC PT "6 Clicks" Mobility  Outcome Measure Help needed turning from your back to your side while in a flat bed without using bedrails?: None Help needed moving from lying on your back to sitting on the side of a flat bed without using bedrails?: None Help needed moving to and from a bed to a chair (including a wheelchair)?: None Help needed standing up from a chair using your arms (e.g., wheelchair or bedside chair)?: None Help needed to walk in hospital room?: A Little Help needed climbing 3-5 steps with a railing? : A Little 6 Click Score: 22    End of Session Equipment Utilized During Treatment: Gait belt Activity Tolerance: Patient tolerated treatment well Patient left: in chair;with call bell/phone within reach;with family/visitor present Nurse Communication: Mobility status PT Visit Diagnosis: Other abnormalities of gait and mobility (R26.89);Muscle weakness (generalized) (M62.81)    Time: 2229-7989 PT Time Calculation (min) (ACUTE ONLY): 18 min   Charges:   PT Evaluation $PT Eval Moderate Complexity: 1 Mod  Mickey Farber, PT, DPT  Sandra Cockayne 11/12/2021, 9:59 AM

## 2021-11-12 NOTE — Progress Notes (Signed)
OT Cancellation Note  Patient Details Name: Cathy Larsen MRN: [id number] DOB: 1926-03-08   Cancelled Treatment:    Reason Eval/Treat Not Completed: Other (comment) (With cardiac rehab. Will return as schedule allows. Thank you.)  [place], OTR/L Acute Rehab Pager: [phone] Office: [phone] 11/12/2021, 10:39 AM

## 2021-11-12 NOTE — Progress Notes (Signed)
CARDIAC REHAB PHASE I   Stent education completed with pt and family. Pt educated on importance of ASA, Brilinta, statin, and NTG. Pt given MI book along with heart healthy and diabetic diets. Reviewed site care and restrictions. Encouraged ambulation with emphasis on safety. Will refer to CRP II [place].  [id number] Rufina Falco, RN BSN 11/12/2021 11:02 AM

## 2021-11-12 NOTE — Progress Notes (Addendum)
Progress Note  Patient Name: Cathy Larsen Date of Encounter: 11/12/2021  [facility] Cardiologist: Mertie Moores, MD   Subjective   No acute events overnight.  Tired this morning; not able to sleep in unit due to constant noise and interruptions.  Inpatient Medications    Scheduled Meds:  aspirin  324 mg Oral Once   aspirin  81 mg Oral Daily   carvedilol  12.5 mg Oral BID WC   Chlorhexidine Gluconate Cloth  6 each Topical Daily   empagliflozin  10 mg Oral Daily   enoxaparin (LOVENOX) injection  40 mg Subcutaneous Q24H   ezetimibe  10 mg Oral Daily   rosuvastatin  10 mg Oral Daily   sodium chloride flush  3 mL Intravenous Q12H   ticagrelor  90 mg Oral BID   Continuous Infusions:  sodium chloride     sodium chloride     PRN Meds: sodium chloride, acetaminophen, nitroGLYCERIN, ondansetron (ZOFRAN) IV, sodium chloride flush   Vital Signs    Vitals:   11/12/21 0300 11/12/21 0400 11/12/21 0500 11/12/21 0737  BP: (!) 160/82 (!) 140/107 130/90   Pulse: 88 89 89   Resp: (!) 21 (!) 21 (!) 21   Temp:    98.2 F (36.8 C)  TempSrc:    Oral  SpO2: 98% 97% 93%   Weight:      Height:        Intake/Output Summary (Last 24 hours) at 11/12/2021 0811 Last data filed at 11/12/2021 0200 Gross per 24 hour  Intake 670 ml  Output 550 ml  Net 120 ml   Last 3 Weights 11/10/2021 11/10/2021 11/10/2021  Weight (lbs) 192 lb 10.9 oz 180 lb 180 lb  Weight (kg) 87.4 kg 81.647 kg 81.647 kg      Telemetry    SR now, self limited AF- Personally Reviewed  ECG    SR 1 AVB, improved inferolateral STE- Personally Reviewed  Physical Exam   GEN: No acute distress.   Neck: No JVD Cardiac: RRR, no murmurs, rubs, or gallops.  Respiratory: Clear to auscultation bilaterally. GI: Soft, nontender, non-distended  MS: No edema; No deformity. Neuro:  Nonfocal  Psych: Normal affect   Labs    High Sensitivity Troponin:   Recent Labs  Lab 11/10/21 1636 11/10/21 1836  TROPONINIHS 171*  >24,000*     Chemistry Recent Labs  Lab 11/10/21 1636 11/10/21 1836 11/11/21 1831  NA 134*  --  129*  K 4.0  --  3.7  CL 99  --  97*  CO2 24  --  24  GLUCOSE 249*  --  221*  BUN 18  --  12  CREATININE 1.20* 1.13* 1.21*  CALCIUM 9.1  --  8.2*  PROT 6.7  --   --   ALBUMIN 3.7  --   --   AST 34  --   --   ALT 21  --   --   ALKPHOS 60  --   --   BILITOT 0.7  --   --   GFRNONAA 42* 45* 42*  ANIONGAP 11  --  8    Lipids  Recent Labs  Lab 11/11/21 1831  CHOL 203*  TRIG 175*  HDL 32*  LDLCALC 136*  CHOLHDL 6.3    Hematology Recent Labs  Lab 11/10/21 1636 11/11/21 1831  WBC 10.2 10.6*  RBC 4.59 4.15  HGB 13.7 12.5  HCT 42.0 36.9  MCV 91.5 88.9  MCH 29.8 30.1  MCHC 32.6  33.9  RDW 12.7 12.9  PLT 310 296   Thyroid No results for input(s): TSH, FREET4 in the last 168 hours.  BNPNo results for input(s): BNP, PROBNP in the last 168 hours.  DDimer No results for input(s): DDIMER in the last 168 hours.   Radiology    CARDIAC CATHETERIZATION  Result Date: 11/10/2021   Ost RCA to Prox RCA lesion is 30% stenosed.   Prox RCA lesion is 100% stenosed.   Dist RCA lesion is 50% stenosed.   Prox Cx lesion is 90% stenosed.   Mid Cx lesion is 85% stenosed.   Dist Cx lesion is 100% stenosed.   Ost LAD to Prox LAD lesion is 60% stenosed.   Mid LAD-1 lesion is 80% stenosed.   Mid LAD-2 lesion is 50% stenosed.   A drug-eluting stent was successfully placed.   Post intervention, there is a 0% residual stenosis.   There is mild left ventricular systolic dysfunction.   LV end diastolic pressure is mildly elevated.   The left ventricular ejection fraction is 50-55% by visual estimate. Acute STEMI secondary to total proximal to mid occlusion of a large dominant RCA. Significant multivessel CAD with evidence for coronary calcification in the left coronary system with 60% proximal LAD stenosis followed by focal 80% stenosis with 50% mid stenosis; severe left circumflex stenoses of 90% proximally  followed by focal 85% and then total occlusion of the distal circumflex after a moderate sized marginal vessel and total occlusion of the proximal to mid RCA. Mild LV dysfunction with EF estimated approximately 50% with mid to basal inferior hypocontractility.  LVEDP 21 mmHg. Successful PCI to the totally occluded RCA with ultimate insertion of a 3.5 x 22 mm Medtronic Onyx Frontier stent postdilated to 3.55 mm with the 100% occlusion being reduced to 0%.  There is evidence for 50% stenosis in the distal RCA proximal to the PDA takeoff. RECOMMENDATION: DAPT for minimum of 1 year.  The patient has significant disease involving her left coronary system.  Will review with colleagues regarding medical therapy versus attempt at circumflex intervention.  The patient reportedly has an allergy to statin.  Will initiate Zetia.  However may consider rechallenge low-dose statin if myalgias were only an issue.  Optimal blood pressure control.   ECHOCARDIOGRAM COMPLETE  Result Date: 11/11/2021    ECHOCARDIOGRAM REPORT   Patient Name:   [name] Date of Exam: 11/11/2021 Medical Rec #:  [id number]      Height:       63.0 in Accession #:    [id number]     Weight:       192.7 lb Date of Birth:  Jan 04, 1926       BSA:          1.903 m Patient Age:    17 years       BP:           136/63 mmHg Patient Gender: F              HR:           82 bpm. Exam Location:  Inpatient Procedure: 2D Echo, Cardiac Doppler and Color Doppler Indications:    Acute myocardial infarction, unspecified I21.9  History:        Patient has prior history of Echocardiogram examinations, most                 recent 03/05/2017. CAD and Acute MI; Cardiac cath yesterday.  Sonographer:    Apolonio Schneiders  Orene Desanctis RDCS Referring Phys: [place]  1. Left ventricular ejection fraction, by estimation, is 60 to 65%. The left ventricle has normal function. The left ventricle demonstrates regional wall motion abnormalities (see scoring diagram/findings for  description). There is mild concentric left ventricular hypertrophy. Indeterminate diastolic filling due to E-A fusion. There is mild hypokinesis of the left ventricular, basal inferior wall.  2. Right ventricular systolic function is normal. The right ventricular size is normal. There is moderately elevated pulmonary artery systolic pressure. The estimated right ventricular systolic pressure is 06.3 mmHg.  3. Left atrial size was moderately dilated.  4. Right atrial size was mild to moderately dilated.  5. The mitral valve is grossly normal. Moderate mitral valve regurgitation.  6. The aortic valve is grossly normal. There is mild calcification of the aortic valve. Aortic valve regurgitation is trivial. No aortic stenosis is present.  7. Aortic dilatation noted. There is borderline dilatation of the ascending aorta, measuring 38 mm.  8. The inferior vena cava is dilated in size with <50% respiratory variability, suggesting right atrial pressure of 15 mmHg. Comparison(s): Changes from prior study are noted. Conclusion(s)/Recommendation(s): Mild hypokinesis of basal inferior wall with overall preserved LVEF. Moderate MR. FINDINGS  Left Ventricle: Left ventricular ejection fraction, by estimation, is 60 to 65%. The left ventricle has normal function. The left ventricle demonstrates regional wall motion abnormalities. Mild hypokinesis of the left ventricular, basal inferior wall. The left ventricular internal cavity size was normal in size. There is mild concentric left ventricular hypertrophy. Indeterminate diastolic filling due to E-A fusion. Right Ventricle: The right ventricular size is normal. No increase in right ventricular wall thickness. Right ventricular systolic function is normal. There is moderately elevated pulmonary artery systolic pressure. The tricuspid regurgitant velocity is 2.85 m/s, and with an assumed right atrial pressure of 15 mmHg, the estimated right ventricular systolic pressure is 01.6 mmHg.  Left Atrium: Left atrial size was moderately dilated. Right Atrium: Right atrial size was mild to moderately dilated. Pericardium: There is no evidence of pericardial effusion. Presence of epicardial fat layer. Mitral Valve: The mitral valve is grossly normal. Mild to moderate mitral annular calcification. Moderate mitral valve regurgitation. Tricuspid Valve: The tricuspid valve is normal in structure. Tricuspid valve regurgitation is mild . No evidence of tricuspid stenosis. Aortic Valve: The aortic valve is grossly normal. There is mild calcification of the aortic valve. Aortic valve regurgitation is trivial. No aortic stenosis is present. Aortic valve mean gradient measures 2.0 mmHg. Aortic valve peak gradient measures 4.0  mmHg. Aortic valve area, by VTI measures 2.29 cm. Pulmonic Valve: The pulmonic valve was not well visualized. Pulmonic valve regurgitation is trivial. No evidence of pulmonic stenosis. Aorta: Aortic dilatation noted. There is borderline dilatation of the ascending aorta, measuring 38 mm. Venous: The inferior vena cava is dilated in size with less than 50% respiratory variability, suggesting right atrial pressure of 15 mmHg. IAS/Shunts: The atrial septum is grossly normal.  LEFT VENTRICLE PLAX 2D LVIDd:         3.90 cm LVIDs:         2.60 cm LV PW:         1.30 cm LV IVS:        1.20 cm LVOT diam:     1.80 cm LV SV:         44 LV SV Index:   23 LVOT Area:     2.54 cm  RIGHT VENTRICLE  IVC RV Basal diam:  3.30 cm  IVC diam: 2.40 cm LEFT ATRIUM             Index        RIGHT ATRIUM           Index LA diam:        4.30 cm 2.26 cm/m   RA Area:     17.50 cm LA Vol (A2C):   95.4 ml 50.12 ml/m  RA Volume:   40.30 ml  21.17 ml/m LA Vol (A4C):   73.0 ml 38.36 ml/m LA Biplane Vol: 84.8 ml 44.56 ml/m  AORTIC VALVE AV Area (Vmax):    2.31 cm AV Area (Vmean):   2.22 cm AV Area (VTI):     2.29 cm AV Vmax:           99.50 cm/s AV Vmean:          66.600 cm/s AV VTI:            0.192 m AV  Peak Grad:      4.0 mmHg AV Mean Grad:      2.0 mmHg LVOT Vmax:         90.30 cm/s LVOT Vmean:        58.100 cm/s LVOT VTI:          0.173 m LVOT/AV VTI ratio: 0.90  AORTA Ao Root diam: 3.70 cm Ao Asc diam:  3.80 cm MITRAL VALVE                  TRICUSPID VALVE MV Area (PHT): 4.21 cm       TR Peak grad:   32.5 mmHg MV Decel Time: 180 msec       TR Vmax:        285.00 cm/s MR Peak grad:    164.9 mmHg MR Mean grad:    101.0 mmHg   SHUNTS MR Vmax:         642.00 cm/s  Systemic VTI:  0.17 m MR Vmean:        472.0 cm/s   Systemic Diam: 1.80 cm MR PISA:         3.08 cm MR PISA Eff ROA: 17 mm MR PISA Radius:  0.70 cm MV E velocity: 114.00 cm/s MV A velocity: 127.00 cm/s MV E/A ratio:  0.90 Buford Dresser MD Electronically signed by Buford Dresser MD Signature Date/Time: 11/11/2021/6:43:15 PM    Final     Cardiac Studies   Cath  Ost RCA to Prox RCA lesion is 30% stenosed.   Prox RCA lesion is 100% stenosed.   Dist RCA lesion is 50% stenosed.   Prox Cx lesion is 90% stenosed.   Mid Cx lesion is 85% stenosed.   Dist Cx lesion is 100% stenosed.   Ost LAD to Prox LAD lesion is 60% stenosed.   Mid LAD-1 lesion is 80% stenosed.   Mid LAD-2 lesion is 50% stenosed.   A drug-eluting stent was successfully placed.   Post intervention, there is a 0% residual stenosis.   There is mild left ventricular systolic dysfunction.   LV end diastolic pressure is mildly elevated.   The left ventricular ejection fraction is 50-55% by visual estimate.   Acute STEMI secondary to total proximal to mid occlusion of a large dominant RCA.   Significant multivessel CAD with evidence for coronary calcification in the left coronary system with 60% proximal LAD stenosis followed by focal 80% stenosis with 50% mid stenosis; severe left  circumflex stenoses of 90% proximally followed by focal 85% and then total occlusion of the distal circumflex after a moderate sized marginal vessel and total occlusion of the proximal to  mid RCA.   Mild LV dysfunction with EF estimated approximately 50% with mid to basal inferior hypocontractility.  LVEDP 21 mmHg.   Successful PCI to the totally occluded RCA with ultimate insertion of a 3.5 x 22 mm Medtronic Onyx Frontier stent postdilated to 3.55 mm with the 100% occlusion being reduced to 0%.  There is evidence for 50% stenosis in the distal RCA proximal to the PDA takeoff.Cath  Ost RCA to Prox RCA lesion is 30% stenosed.   Prox RCA lesion is 100% stenosed.   Dist RCA lesion is 50% stenosed.   Prox Cx lesion is 90% stenosed.   Mid Cx lesion is 85% stenosed.   Dist Cx lesion is 100% stenosed.   Ost LAD to Prox LAD lesion is 60% stenosed.   Mid LAD-1 lesion is 80% stenosed.   Mid LAD-2 lesion is 50% stenosed.   A drug-eluting stent was successfully placed.   Post intervention, there is a 0% residual stenosis.   There is mild left ventricular systolic dysfunction.   LV end diastolic pressure is mildly elevated.   The left ventricular ejection fraction is 50-55% by visual estimate.   Acute STEMI secondary to total proximal to mid occlusion of a large dominant RCA.   Significant multivessel CAD with evidence for coronary calcification in the left coronary system with 60% proximal LAD stenosis followed by focal 80% stenosis with 50% mid stenosis; severe left circumflex stenoses of 90% proximally followed by focal 85% and then total occlusion of the distal circumflex after a moderate sized marginal vessel and total occlusion of the proximal to mid RCA.   Mild LV dysfunction with EF estimated approximately 50% with mid to basal inferior hypocontractility.  LVEDP 21 mmHg.   Successful PCI to the totally occluded RCA with ultimate insertion of a 3.5 x 22 mm Medtronic Onyx Frontier stent postdilated to 3.55 mm with the 100% occlusion being reduced to 0%.  There is evidence for 50% stenosis in the distal RCA proximal to the PDA takeoff.  Intervention       1. Left ventricular  ejection fraction, by estimation, is 60 to 65%. The  left ventricle has normal function. The left ventricle demonstrates  regional wall motion abnormalities (see scoring diagram/findings for  description). There is mild concentric left  ventricular hypertrophy. Indeterminate diastolic filling due to E-A  fusion. There is mild hypokinesis of the left ventricular, basal inferior  wall.   2. Right ventricular systolic function is normal. The right ventricular  size is normal. There is moderately elevated pulmonary artery systolic  pressure. The estimated right ventricular systolic pressure is 56.8 mmHg.   3. Left atrial size was moderately dilated.   4. Right atrial size was mild to moderately dilated.   5. The mitral valve is grossly normal. Moderate mitral valve  regurgitation.   6. The aortic valve is grossly normal. There is mild calcification of the  aortic valve. Aortic valve regurgitation is trivial. No aortic stenosis is  present.   7. Aortic dilatation noted. There is borderline dilatation of the  ascending aorta, measuring 38 mm.   8. The inferior vena cava is dilated in size with <50% respiratory  variability, suggesting right atrial pressure of 15 mmHg.   Comparison(s): Changes from prior study are noted.   Conclusion(s)/Recommendation(s): Mild hypokinesis of  basal inferior wall  with overall preserved LVEF. Moderate MR.   Patient Profile     88 y.o. female here with inferior STEMI hyperlipidemia and diabetes hypertension patient of Dr. Damita Dunnings  Assessment & Plan    Inferior ST elevation myocardial infarction:  Increase coreg to 12.5mg  bid, start losartan 12.5 qpm, start prn NTG.  Will trial Crestor 10mg  QPM and if cannot tolerate, will manage as outpatient (re PCSK9 inhibitor).  Patient has failed another statin some time ago (unclear which statin).  Cont DAPT x 1 year.  EF preserved with moderate MR (start losartan as above).  Discharge today with cardiac rehab and  cardiology follow up.  Last saw Dr. Acie Fredrickson 2 years ago.  Patient unable to afford copay for Ticagrelor, discussed with pharmacy, load with plavix 600mg  and start 75mg  qday.   Hyperlipidemia:  On Crestor 10mg , will assess as outpatient.   T2DM:  Start Jardiance 10mg , cont ASA, trial statin.  CAD:  Cont BB, trial statin, PRN NTG, ASA.  Moderate residual disease.  Given advanced age and relatively good EF, will treat medically.  AF:  Transient (<48hr duration).  Monitor for now.  Does have atrial enlargement so would not be surprised if develops more burden later.    Dispo:  Discharge today.  For questions or updates, please contact [place] Please consult [email or web address] for contact info under        Signed, Early Osmond, MD  11/12/2021, 8:11 AM

## 2021-11-16 ENCOUNTER — Emergency Department (HOSPITAL_COMMUNITY): Payer: Medicare HMO

## 2021-11-16 ENCOUNTER — Observation Stay (HOSPITAL_COMMUNITY)
Admission: EM | Admit: 2021-11-16 | Discharge: 2021-11-17 | Disposition: A | Discharge status: Home / Self Care | Payer: Medicare HMO | Attending: Internal Medicine | Admitting: Internal Medicine

## 2021-11-16 ENCOUNTER — Other Ambulatory Visit: Payer: Self-pay

## 2021-11-16 ENCOUNTER — Encounter (HOSPITAL_COMMUNITY): Payer: Self-pay | Admitting: Emergency Medicine

## 2021-11-16 DIAGNOSIS — Z85828 Personal history of other malignant neoplasm of skin: Secondary | ICD-10-CM | POA: Insufficient documentation

## 2021-11-16 DIAGNOSIS — Z79899 Other long term (current) drug therapy: Secondary | ICD-10-CM | POA: Insufficient documentation

## 2021-11-16 DIAGNOSIS — I130 Hypertensive heart and chronic kidney disease with heart failure and stage 1 through stage 4 chronic kidney disease, or unspecified chronic kidney disease: Secondary | ICD-10-CM | POA: Diagnosis not present

## 2021-11-16 DIAGNOSIS — I249 Acute ischemic heart disease, unspecified: Secondary | ICD-10-CM

## 2021-11-16 DIAGNOSIS — I509 Heart failure, unspecified: Secondary | ICD-10-CM

## 2021-11-16 DIAGNOSIS — Z20822 Contact with and (suspected) exposure to covid-19: Secondary | ICD-10-CM | POA: Insufficient documentation

## 2021-11-16 DIAGNOSIS — I495 Sick sinus syndrome: Secondary | ICD-10-CM | POA: Diagnosis not present

## 2021-11-16 DIAGNOSIS — Z7902 Long term (current) use of antithrombotics/antiplatelets: Secondary | ICD-10-CM | POA: Diagnosis not present

## 2021-11-16 DIAGNOSIS — Z7982 Long term (current) use of aspirin: Secondary | ICD-10-CM | POA: Diagnosis not present

## 2021-11-16 DIAGNOSIS — I2510 Atherosclerotic heart disease of native coronary artery without angina pectoris: Secondary | ICD-10-CM | POA: Insufficient documentation

## 2021-11-16 DIAGNOSIS — I200 Unstable angina: Secondary | ICD-10-CM

## 2021-11-16 DIAGNOSIS — R778 Other specified abnormalities of plasma proteins: Secondary | ICD-10-CM | POA: Diagnosis not present

## 2021-11-16 DIAGNOSIS — I441 Atrioventricular block, second degree: Secondary | ICD-10-CM | POA: Diagnosis not present

## 2021-11-16 DIAGNOSIS — I10 Essential (primary) hypertension: Secondary | ICD-10-CM | POA: Diagnosis not present

## 2021-11-16 DIAGNOSIS — N1832 Chronic kidney disease, stage 3b: Secondary | ICD-10-CM | POA: Insufficient documentation

## 2021-11-16 DIAGNOSIS — I4430 Unspecified atrioventricular block: Secondary | ICD-10-CM

## 2021-11-16 DIAGNOSIS — I25110 Atherosclerotic heart disease of native coronary artery with unstable angina pectoris: Secondary | ICD-10-CM

## 2021-11-16 DIAGNOSIS — R0602 Shortness of breath: Secondary | ICD-10-CM | POA: Diagnosis not present

## 2021-11-16 DIAGNOSIS — E1122 Type 2 diabetes mellitus with diabetic chronic kidney disease: Secondary | ICD-10-CM | POA: Diagnosis not present

## 2021-11-16 DIAGNOSIS — R0609 Other forms of dyspnea: Secondary | ICD-10-CM

## 2021-11-16 DIAGNOSIS — J811 Chronic pulmonary edema: Secondary | ICD-10-CM | POA: Diagnosis not present

## 2021-11-16 DIAGNOSIS — E119 Type 2 diabetes mellitus without complications: Secondary | ICD-10-CM

## 2021-11-16 DIAGNOSIS — R0789 Other chest pain: Secondary | ICD-10-CM | POA: Diagnosis not present

## 2021-11-16 DIAGNOSIS — R0902 Hypoxemia: Secondary | ICD-10-CM | POA: Diagnosis not present

## 2021-11-16 DIAGNOSIS — I5020 Unspecified systolic (congestive) heart failure: Secondary | ICD-10-CM

## 2021-11-16 DIAGNOSIS — I517 Cardiomegaly: Secondary | ICD-10-CM | POA: Diagnosis not present

## 2021-11-16 DIAGNOSIS — R079 Chest pain, unspecified: Secondary | ICD-10-CM | POA: Diagnosis not present

## 2021-11-16 DIAGNOSIS — I5033 Acute on chronic diastolic (congestive) heart failure: Secondary | ICD-10-CM | POA: Diagnosis not present

## 2021-11-16 DIAGNOSIS — R2689 Other abnormalities of gait and mobility: Secondary | ICD-10-CM | POA: Diagnosis not present

## 2021-11-16 DIAGNOSIS — Z7984 Long term (current) use of oral hypoglycemic drugs: Secondary | ICD-10-CM | POA: Diagnosis not present

## 2021-11-16 DIAGNOSIS — R001 Bradycardia, unspecified: Secondary | ICD-10-CM

## 2021-11-16 LAB — BRAIN NATRIURETIC PEPTIDE: B Natriuretic Peptide: 1161.8 pg/mL — ABNORMAL HIGH (ref 0.0–100.0)

## 2021-11-16 LAB — COMPREHENSIVE METABOLIC PANEL
ALT: 24 U/L (ref 0–44)
AST: 21 U/L (ref 15–41)
Albumin: 3.3 g/dL — ABNORMAL LOW (ref 3.5–5.0)
Alkaline Phosphatase: 64 U/L (ref 38–126)
Anion gap: 7 (ref 5–15)
BUN: 15 mg/dL (ref 8–23)
CO2: 29 mmol/L (ref 22–32)
Calcium: 9 mg/dL (ref 8.9–10.3)
Chloride: 103 mmol/L (ref 98–111)
Creatinine, Ser: 1.47 mg/dL — ABNORMAL HIGH (ref 0.44–1.00)
GFR, Estimated: 33 mL/min — ABNORMAL LOW (ref >60)
Glucose, Bld: 153 mg/dL — ABNORMAL HIGH (ref 70–99)
Potassium: 4.5 mmol/L (ref 3.5–5.1)
Sodium: 139 mmol/L (ref 135–145)
Total Bilirubin: 0.7 mg/dL (ref 0.3–1.2)
Total Protein: 6.1 g/dL — ABNORMAL LOW (ref 6.5–8.1)

## 2021-11-16 LAB — CBC
HCT: 36 % (ref 36.0–46.0)
Hemoglobin: 11.5 g/dL — ABNORMAL LOW (ref 12.0–15.0)
MCH: 29.5 pg (ref 26.0–34.0)
MCHC: 31.9 g/dL (ref 30.0–36.0)
MCV: 92.3 fL (ref 80.0–100.0)
Platelets: 326 10*3/uL (ref 150–400)
RBC: 3.9 MIL/uL (ref 3.87–5.11)
RDW: 13.2 % (ref 11.5–15.5)
WBC: 9.9 10*3/uL (ref 4.0–10.5)
nRBC: 0 % (ref 0.0–0.2)

## 2021-11-16 LAB — GLUCOSE, CAPILLARY
Glucose-Capillary: 195 mg/dL — ABNORMAL HIGH (ref 70–99)
Glucose-Capillary: 125 mg/dL — ABNORMAL HIGH (ref 70–99)

## 2021-11-16 LAB — CK: Total CK: 39 U/L (ref 38–234)

## 2021-11-16 LAB — TSH: TSH: 3.031 u[IU]/mL (ref 0.350–4.500)

## 2021-11-16 LAB — RESP PANEL BY RT-PCR (FLU A&B, COVID) ARPGX2
Influenza A by PCR: NEGATIVE
Influenza B by PCR: NEGATIVE
SARS Coronavirus 2 by RT PCR: NEGATIVE

## 2021-11-16 LAB — TROPONIN I (HIGH SENSITIVITY)
Troponin I (High Sensitivity): 967 ng/L (ref <18)
Troponin I (High Sensitivity): 871 ng/L (ref <18)

## 2021-11-16 MED ORDER — POLYVINYL ALCOHOL 1.4 % OP SOLN: 1.0000 [drp] | OPHTHALMIC | Status: DC | PRN

## 2021-11-16 MED ORDER — ONDANSETRON HCL 4 MG/2ML IJ SOLN: 4.0000 mg | Freq: Four times a day (QID) | INTRAMUSCULAR | Status: DC | PRN

## 2021-11-16 MED ORDER — SODIUM CHLORIDE 0.9 % IV SOLN: 250.0000 mL | INTRAVENOUS | Status: DC | PRN

## 2021-11-16 MED ORDER — HYDRALAZINE HCL 25 MG PO TABS
25.0000 mg | ORAL_TABLET | Freq: Four times a day (QID) | ORAL | Status: DC | PRN
  Administered 2021-11-16 – 2021-11-17 (×2): 25 mg via ORAL
  Filled 2021-11-16 (×2): qty 1

## 2021-11-16 MED ORDER — FUROSEMIDE 10 MG/ML IJ SOLN
20.0000 mg | Freq: Once | INTRAMUSCULAR | Status: AC
  Administered 2021-11-16: 20 mg via INTRAVENOUS
  Filled 2021-11-16: qty 2

## 2021-11-16 MED ORDER — SODIUM CHLORIDE 0.9% FLUSH
3.0000 mL | Freq: Two times a day (BID) | INTRAVENOUS | Status: DC
  Administered 2021-11-16 – 2021-11-17 (×2): 3 mL via INTRAVENOUS

## 2021-11-16 MED ORDER — HEPARIN SODIUM (PORCINE) 5000 UNIT/ML IJ SOLN
4000.0000 [IU] | Freq: Once | INTRAMUSCULAR | Status: AC
  Administered 2021-11-16: 4000 [IU] via INTRAVENOUS
  Filled 2021-11-16: qty 1

## 2021-11-16 MED ORDER — EMPAGLIFLOZIN 10 MG PO TABS
10.0000 mg | ORAL_TABLET | Freq: Every day | ORAL | Status: DC
  Administered 2021-11-17: 10 mg via ORAL
  Filled 2021-11-16 (×2): qty 1

## 2021-11-16 MED ORDER — MAGNESIUM 200 MG PO TABS: 200.0000 mg | ORAL_TABLET | Freq: Every day | ORAL | Status: DC

## 2021-11-16 MED ORDER — FAMOTIDINE 20 MG PO TABS
10.0000 mg | ORAL_TABLET | Freq: Once | ORAL | Status: AC
  Administered 2021-11-16: 10 mg via ORAL
  Filled 2021-11-16: qty 1

## 2021-11-16 MED ORDER — LOSARTAN POTASSIUM 50 MG PO TABS
50.0000 mg | ORAL_TABLET | Freq: Every day | ORAL | Status: DC
  Administered 2021-11-16 – 2021-11-17 (×2): 50 mg via ORAL
  Filled 2021-11-16 (×2): qty 1

## 2021-11-16 MED ORDER — ASPIRIN 81 MG PO CHEW: 324.0000 mg | CHEWABLE_TABLET | Freq: Once | ORAL | Status: DC

## 2021-11-16 MED ORDER — INSULIN ASPART 100 UNIT/ML IJ SOLN
0.0000 [IU] | Freq: Three times a day (TID) | INTRAMUSCULAR | Status: DC
  Administered 2021-11-16: 2 [IU] via SUBCUTANEOUS
  Administered 2021-11-17: 1 [IU] via SUBCUTANEOUS

## 2021-11-16 MED ORDER — EZETIMIBE 10 MG PO TABS
10.0000 mg | ORAL_TABLET | Freq: Every day | ORAL | Status: DC
Start: 2021-11-16 — End: 2021-11-17
  Administered 2021-11-16 – 2021-11-17 (×2): 10 mg via ORAL
  Filled 2021-11-16 (×2): qty 1

## 2021-11-16 MED ORDER — CARBOXYMETHYLCELLULOSE SODIUM 0.25 % OP SOLN: 1.0000 [drp] | Freq: Every day | OPHTHALMIC | Status: DC

## 2021-11-16 MED ORDER — ASPIRIN EC 81 MG PO TBEC
81.0000 mg | DELAYED_RELEASE_TABLET | Freq: Every day | ORAL | Status: DC
  Administered 2021-11-17: 81 mg via ORAL
  Filled 2021-11-16: qty 1

## 2021-11-16 MED ORDER — MAGNESIUM OXIDE -MG SUPPLEMENT 400 (240 MG) MG PO TABS
200.0000 mg | ORAL_TABLET | Freq: Every day | ORAL | Status: DC
  Administered 2021-11-16: 200 mg via ORAL
  Filled 2021-11-16: qty 1

## 2021-11-16 MED ORDER — ROSUVASTATIN CALCIUM 5 MG PO TABS
10.0000 mg | ORAL_TABLET | Freq: Every day | ORAL | Status: DC
  Administered 2021-11-16 – 2021-11-17 (×2): 10 mg via ORAL
  Filled 2021-11-16 (×2): qty 2

## 2021-11-16 MED ORDER — SODIUM CHLORIDE 0.9 % IV SOLN: INTRAVENOUS | Status: DC

## 2021-11-16 MED ORDER — SODIUM CHLORIDE 0.9% FLUSH: 3.0000 mL | INTRAVENOUS | Status: DC | PRN

## 2021-11-16 MED ORDER — HEPARIN SODIUM (PORCINE) 5000 UNIT/ML IJ SOLN
5000.0000 [IU] | Freq: Two times a day (BID) | INTRAMUSCULAR | Status: DC
  Administered 2021-11-16 – 2021-11-17 (×2): 5000 [IU] via SUBCUTANEOUS
  Filled 2021-11-16 (×2): qty 1

## 2021-11-16 MED ORDER — ACETAMINOPHEN 325 MG PO TABS
650.0000 mg | ORAL_TABLET | ORAL | Status: DC | PRN
  Administered 2021-11-16: 650 mg via ORAL
  Filled 2021-11-16: qty 2

## 2021-11-16 MED ORDER — CLOPIDOGREL BISULFATE 75 MG PO TABS
75.0000 mg | ORAL_TABLET | Freq: Every day | ORAL | Status: DC
Start: 2021-11-16 — End: 2021-11-17
  Administered 2021-11-16 – 2021-11-17 (×2): 75 mg via ORAL
  Filled 2021-11-16 (×2): qty 1

## 2021-11-16 NOTE — Progress Notes (Signed)
Arrived from ED to [facility].  A&Ox4.  CCMD notified.  Vital signs.  CHG

## 2021-11-16 NOTE — Consult Note (Addendum)
Cardiology Consultation:   Patient ID: Cathy Larsen MRN: [id number]; DOB: 10/12/1926  Admit date: 11/16/2021 Date of Consult: 11/16/2021  PCP:  Tonia Ghent, MD   [facility] Providers Cardiologist:  Mertie Moores, MD     Patient Profile:   Cathy Larsen is a 88 y.o. female with a hx of acute on HTN, HLD, DM with recent STEMI s/p PCI to RCA who is being seen 11/16/2021 for the evaluation of chest pain at the request of Dr. Roosevelt Locks.  History of Present Illness:     Cathy Larsen is a 88 yo female with PMH noted above. She was most recently admitted for an inferior STEMI with significant multivessel CAD with coronary calcification of 60% proximal LAD followed by 80% then 50% mid vessel, severe pLcx 90% followed by 85% focal, then 100% occlusion of dLcx with collaterals .  Successful PCI/DES x1 to 100% occluded mRCA and placed on DAPT with ASA/plavix. hsTn peaked >24000. Statin intolerant but agreeable to discharge on Crestor 10mg  daily, along with zetia. Her HCTZ was stopped, and placed on coreg 12.5mg  BID and losartan 50mg  daily. Hgb A1c was 8.1 and placed on jardiance.   Presented to the ED on 1/13 with complaints of left-sided chest discomfort, fatigue and shortness of breath.  States since leaving the hospital she has felt very fatigued.  She has been compliant with her medications.  Yesterday she developed left-sided chest discomfort up underneath her breast.  She is unable to identify what has been bothering her the most. This morning reports feeling more short of breath and EMS was called. She lives with her son.   In the ED her labs showed Na+ 139, K+ 4.5,Cr 1.47, albumin 3.3, hsTn 967, WBC 9.9, Hgb 11.5. CXR with pulmonary edema. EKG via EMS and on arrival was notable for evolving ST changes from recent inferior MI, bradycardia with rate of 39 bpm. Initially called by the EDP for concern of slight ST elevation in inferior leads. Briefly evaluated by Dr. Ali Lowe in the ED confirming no  significant ST elevation on EKGs with recommendations for further management via IM and cardiology consulting for medical management of her CAD. She has been given IV lasix 20mg  x1 while in the ED. In review of telemetry shows intermittent episodes of SB in the 30-40s with 1st degree AVB. At the time of interview she does not have any specific complaints other than feeling tired.    Past Medical History:  Diagnosis Date   Arthritis    feet and hands   CAD (coronary artery disease)    cath 1998 with 40% lesion in LAD but no other disease   Carpal tunnel syndrome    Diabetes mellitus    Gout 03/1998   Hyperlipidemia 08/1997   Hypertension 09/23/1995   Skin cancer     Past Surgical History:  Procedure Laterality Date   BIOPSY BREAST  03/2000   Ductogram right breast w/ biopsy- obstructing lesion benign by path. (Dr. Annamaria Larsen)    BLADDER SURGERY     bladder tack    CARDIAC CATHETERIZATION  03/1997   40% occulsion lad    CARPAL TUNNEL RELEASE Right 03/14/2014   Procedure: RIGHT CARPAL TUNNEL RELEASE;  Surgeon: Tennis Must, MD;  Location: [facility];  Service: Orthopedics;  Laterality: Right;   CATARACT EXTRACTION     CORONARY/GRAFT ACUTE MI REVASCULARIZATION N/A 11/10/2021   Procedure: Coronary/Graft Acute MI Revascularization;  Surgeon: Troy Sine, MD;  Location: [facility];  Service: Cardiovascular;  Laterality: N/A;   LEFT HEART CATH AND CORONARY ANGIOGRAPHY N/A 11/10/2021   Procedure: LEFT HEART CATH AND CORONARY ANGIOGRAPHY;  Surgeon: Troy Sine, MD;  Location: [facility];  Service: Cardiovascular;  Laterality: N/A;   PARTIAL HYSTERECTOMY     VAGINAL DELIVERY     x 3   YAG LASER APPLICATION Right 11/07/4313   Procedure: YAG LASER APPLICATION;  Surgeon: Williams Che, MD;  Location: [facility];  Service: Ophthalmology;  Laterality: Right;     Home Medications:  Prior to Admission medications   Medication Sig Start Date End Date Taking? Authorizing  Provider  aspirin EC 81 MG tablet Take 1 tablet (81 mg total) by mouth daily. Swallow whole. 11/12/21 11/12/22 Yes Troy Sine, MD  Carboxymethylcellulose Sodium (THERATEARS) 0.25 % SOLN Place 1 drop into both eyes at bedtime.   Yes [provider]  carvedilol (COREG) 12.5 MG tablet Take 1 tablet (12.5 mg total) by mouth 2 (two) times daily with a meal. 11/12/21  Yes Troy Sine, MD  clopidogrel (PLAVIX) 75 MG tablet Take 1 tablet (75 mg total) by mouth daily. 11/13/21  Yes Troy Sine, MD  empagliflozin (JARDIANCE) 10 MG TABS tablet Take 1 tablet (10 mg total) by mouth daily. 11/13/21  Yes Troy Sine, MD  ezetimibe (ZETIA) 10 MG tablet Take 1 tablet (10 mg total) by mouth daily. 11/13/21  Yes Troy Sine, MD  famotidine (PEPCID) 10 MG tablet Take 10 mg by mouth daily as needed for heartburn or indigestion.   Yes [provider]  Ginger, Zingiber officinalis, (GINGER ROOT) 500 MG CAPS Take 500 mg by mouth at bedtime.   Yes [provider]  losartan (COZAAR) 50 MG tablet Take 1 tablet (50 mg total) by mouth daily. 09/20/21  Yes Tonia Ghent, MD  Magnesium 200 MG TABS Take 200 mg by mouth at bedtime.   Yes [provider]  Multiple Vitamin (MULTIVITAMIN WITH MINERALS) TABS Take 1 tablet by mouth at bedtime.   Yes [provider]  nitroGLYCERIN (NITROSTAT) 0.4 MG SL tablet Place 1 tablet (0.4 mg total) under the tongue every 5 (five) minutes as needed for chest pain (max 3 tabs in 15 minutes). 09/13/19  Yes Tonia Ghent, MD  Omega 3-6-9 Fatty Acids (OMEGA-3-6-9 PO) Take 2,400 mg by mouth 3 (three) times a week. liquid   Yes [provider]  Omega-3 Fatty Acids (FISH OIL) 1200 MG CAPS Take 1,200 mg by mouth daily.   Yes [provider]  rosuvastatin (CRESTOR) 10 MG tablet Take 1 tablet (10 mg total) by mouth daily. 11/13/21  Yes Troy Sine, MD  Turmeric 500 MG CAPS Take 500 mg by mouth at bedtime.   Yes [provider]  glucose blood (ONE TOUCH ULTRA TEST) test strip USE DAILY TO CHECK SUGAR.  Dx 11.9. 03/16/21   Tonia Ghent, MD    Inpatient Medications: Scheduled Meds:  aspirin  324 mg Oral Once   aspirin EC  81 mg Oral Daily   clopidogrel  75 mg Oral Daily   empagliflozin  10 mg Oral Daily   ezetimibe  10 mg Oral Daily   heparin  5,000 Units Subcutaneous Q12H   insulin aspart  0-9 Units Subcutaneous TID WC   losartan  50 mg Oral Daily   magnesium oxide  200 mg Oral QHS   rosuvastatin  10 mg Oral Daily   sodium  chloride flush  3 mL Intravenous Q12H   Continuous Infusions:  sodium chloride 20 mL/hr at 11/16/21 1140   sodium chloride     PRN Meds: sodium chloride, acetaminophen, hydrALAZINE, ondansetron (ZOFRAN) IV, polyvinyl alcohol, sodium chloride flush  Allergies:    Allergies  Allergen Reactions   Statins Other (See Comments)    Myalgias.    Keflex [Cephalexin] Other (See Comments)    Migraine   Requip [Ropinirole Hcl] Other (See Comments)    headache    Social History:   Social History   Socioeconomic History   Marital status: Married    Spouse name: Not on file   Number of children: 3   Years of education: Not on file   Highest education level: Not on file  Occupational History   Occupation: Far[employer]   Tobacco Use   Smoking status: Never   Smokeless tobacco: Never  Vaping Use   Vaping Use: Never used  Substance and Sexual Activity   Alcohol use: No    Alcohol/week: 0.0 standard drinks   Drug use: No   Sexual activity: Not Currently  Other Topics Concern   Not on file  Social History Narrative   Widowed 10/2012, husband had CVA and dementia   Grandson lives with patient as of 2018   Social Determinants of Health   Financial Resource Strain: Not on file  Food Insecurity: Not on file  Transportation Needs: Not on file  Physical Activity: Not on file  Stress: Not on file  Social Connections: Not on file  Intimate Partner Violence: Not on file     Family History:    Family History  Problem Relation Age of Onset   Cancer Mother        breast    Kidney failure Mother    Cancer Father        colon    Cancer Brother        bladder    Cancer Brother        lung   Hypertension Sister    Hypertension Sister    Diabetes Neg Hx    Stroke Neg Hx      ROS:  Please see the history of present illness.   All other ROS reviewed and negative.     Physical Exam/Data:   Vitals:   11/16/21 1047 11/16/21 1253  BP: 122/67 117/72  Pulse: 74 (!) 37  Resp: 18 18  Temp: 98.2 F (36.8 C)   SpO2: 100% 93%  Weight: 87 kg   Height: 5\' 3"  (1.6 m)    No intake or output data in the 24 hours ending 11/16/21 1505 Last 3 Weights 11/16/2021 11/10/2021 11/10/2021  Weight (lbs) 191 lb 12.8 oz 192 lb 10.9 oz 180 lb  Weight (kg) 87 kg 87.4 kg 81.647 kg     Body mass index is 33.98 kg/m.  General:  Well nourished, well developed, in no acute distress HEENT: normal Neck: no JVD Vascular: No carotid bruits; Distal pulses 2+ bilaterally Cardiac:  normal S1, S2; RRR; no murmur  Lungs:  clear to auscultation bilaterally, no wheezing, rhonchi or rales  Abd: soft, nontender, no hepatomegaly  Ext: no edema Musculoskeletal:  No deformities, BUE and BLE strength normal and equal Skin: warm and dry  Neuro:  CNs 2-12 intact, no focal abnormalities noted Psych:  Normal affect   EKG:  The EKG was personally reviewed and demonstrates:  10:51am SR 77 bpm, nonspecific TW changes 10:55am SB 39bpm prolonged PR interval  Telemetry:  Telemetry was personally reviewed and demonstrates:  SR, with intermittent SB (rates 30-40s), occasional dropped beats  Relevant CV Studies:  Cath: 11/10/2021    Ost RCA to Prox RCA lesion is 30% stenosed.   Prox RCA lesion is 100% stenosed.   Dist RCA lesion is 50% stenosed.   Prox Cx lesion is 90% stenosed.   Mid Cx lesion is 85% stenosed.   Dist Cx lesion is 100% stenosed.   Ost LAD to Prox LAD lesion is 60%  stenosed.   Mid LAD-1 lesion is 80% stenosed.   Mid LAD-2 lesion is 50% stenosed.   A drug-eluting stent was successfully placed.   Post intervention, there is a 0% residual stenosis.   There is mild left ventricular systolic dysfunction.   LV end diastolic pressure is mildly elevated.   The left ventricular ejection fraction is 50-55% by visual estimate.   Acute STEMI secondary to total proximal to mid occlusion of a large dominant RCA.   Significant multivessel CAD with evidence for coronary calcification in the left coronary system with 60% proximal LAD stenosis followed by focal 80% stenosis with 50% mid stenosis; severe left circumflex stenoses of 90% proximally followed by focal 85% and then total occlusion of the distal circumflex after a moderate sized marginal vessel and total occlusion of the proximal to mid RCA.   Mild LV dysfunction with EF estimated approximately 50% with mid to basal inferior hypocontractility.  LVEDP 21 mmHg.   Successful PCI to the totally occluded RCA with ultimate insertion of a 3.5 x 22 mm Medtronic Onyx Frontier stent postdilated to 3.55 mm with the 100% occlusion being reduced to 0%.  There is evidence for 50% stenosis in the distal RCA proximal to the PDA takeoff.   RECOMMENDATION: DAPT for minimum of 1 year.  The patient has significant disease involving her left coronary system.  Will review with colleagues regarding medical therapy versus attempt at circumflex intervention.  The patient reportedly has an allergy to statin.  Will initiate Zetia.  However may consider rechallenge low-dose statin if myalgias were only an issue.  Optimal blood pressure control.  Diagnostic Dominance: Right Intervention    Echo: 11/11/2021  IMPRESSIONS     1. Left ventricular ejection fraction, by estimation, is 60 to 65%. The  left ventricle has normal function. The left ventricle demonstrates  regional wall motion abnormalities (see scoring diagram/findings for   description). There is mild concentric left  ventricular hypertrophy. Indeterminate diastolic filling due to E-A  fusion. There is mild hypokinesis of the left ventricular, basal inferior  wall.   2. Right ventricular systolic function is normal. The right ventricular  size is normal. There is moderately elevated pulmonary artery systolic  pressure. The estimated right ventricular systolic pressure is 40.1 mmHg.   3. Left atrial size was moderately dilated.   4. Right atrial size was mild to moderately dilated.   5. The mitral valve is grossly normal. Moderate mitral valve  regurgitation.   6. The aortic valve is grossly normal. There is mild calcification of the  aortic valve. Aortic valve regurgitation is trivial. No aortic stenosis is  present.   7. Aortic dilatation noted. There is borderline dilatation of the  ascending aorta, measuring 38 mm.   8. The inferior vena cava is dilated in size with <50% respiratory  variability, suggesting right atrial pressure of 15 mmHg.   Comparison(s): Changes from prior study are noted.   Conclusion(s)/Recommendation(s): Mild hypokinesis of basal inferior wall  with overall preserved LVEF. Moderate MR.   FINDINGS   Left Ventricle: Left ventricular ejection fraction, by estimation, is 60  to 65%. The left ventricle has normal function. The left ventricle  demonstrates regional wall motion abnormalities. Mild hypokinesis of the  left ventricular, basal inferior wall.  The left ventricular internal cavity size was normal in size. There is  mild concentric left ventricular hypertrophy. Indeterminate diastolic  filling due to E-A fusion.   Right Ventricle: The right ventricular size is normal. No increase in  right ventricular wall thickness. Right ventricular systolic function is  normal. There is moderately elevated pulmonary artery systolic pressure.  The tricuspid regurgitant velocity is  2.85 m/s, and with an assumed right atrial pressure  of 15 mmHg, the  estimated right ventricular systolic pressure is 86.7 mmHg.   Left Atrium: Left atrial size was moderately dilated.   Right Atrium: Right atrial size was mild to moderately dilated.   Pericardium: There is no evidence of pericardial effusion. Presence of  epicardial fat layer.   Mitral Valve: The mitral valve is grossly normal. Mild to moderate mitral  annular calcification. Moderate mitral valve regurgitation.   Tricuspid Valve: The tricuspid valve is normal in structure. Tricuspid  valve regurgitation is mild . No evidence of tricuspid stenosis.   Aortic Valve: The aortic valve is grossly normal. There is mild  calcification of the aortic valve. Aortic valve regurgitation is trivial.  No aortic stenosis is present. Aortic valve mean gradient measures 2.0  mmHg. Aortic valve peak gradient measures 4.0   mmHg. Aortic valve area, by VTI measures 2.29 cm.   Pulmonic Valve: The pulmonic valve was not well visualized. Pulmonic valve  regurgitation is trivial. No evidence of pulmonic stenosis.   Aorta: Aortic dilatation noted. There is borderline dilatation of the  ascending aorta, measuring 38 mm.   Venous: The inferior vena cava is dilated in size with less than 50%  respiratory variability, suggesting right atrial pressure of 15 mmHg.   IAS/Shunts: The atrial septum is grossly normal.   Laboratory Data:  High Sensitivity Troponin:   Recent Labs  Lab 11/10/21 1636 11/10/21 1836 11/16/21 1111 11/16/21 1320  TROPONINIHS 171* >24,000* 967* 871*     Chemistry Recent Labs  Lab 11/10/21 1636 11/10/21 1836 11/11/21 1831 11/16/21 1111  NA 134*  --  129* 139  K 4.0  --  3.7 4.5  CL 99  --  97* 103  CO2 24  --  24 29  GLUCOSE 249*  --  221* 153*  BUN 18  --  12 15  CREATININE 1.20* 1.13* 1.21* 1.47*  CALCIUM 9.1  --  8.2* 9.0  GFRNONAA 42* 45* 42* 33*  ANIONGAP 11  --  8 7    Recent Labs  Lab 11/10/21 1636 11/16/21 1111  PROT 6.7 6.1*  ALBUMIN  3.7 3.3*  AST 34 21  ALT 21 24  ALKPHOS 60 64  BILITOT 0.7 0.7   Lipids  Recent Labs  Lab 11/11/21 1831  CHOL 203*  TRIG 175*  HDL 32*  LDLCALC 136*  CHOLHDL 6.3    Hematology Recent Labs  Lab 11/10/21 1636 11/11/21 1831 11/16/21 1111  WBC 10.2 10.6* 9.9  RBC 4.59 4.15 3.90  HGB 13.7 12.5 11.5*  HCT 42.0 36.9 36.0  MCV 91.5 88.9 92.3  MCH 29.8 30.1 29.5  MCHC 32.6 33.9 31.9  RDW 12.7 12.9 13.2  PLT 310 296 326   Thyroid No results for input(s): TSH, FREET4  in the last 168 hours.  BNP Recent Labs  Lab 11/16/21 1248  BNP 1,161.8*    DDimer No results for input(s): DDIMER in the last 168 hours.   Radiology/Studies:  DG Chest Port 1 View  Result Date: 11/16/2021 CLINICAL DATA:  Shortness of breath EXAM: PORTABLE CHEST 1 VIEW COMPARISON:  Portable exam 1108 hours compared 07/04/2012 FINDINGS: Enlargement of cardiac silhouette with pulmonary vascular congestion. Atherosclerotic calcification aorta. Interstitial infiltrates mid to lower lungs favor pulmonary edema. No pleural effusion or pneumothorax. Bones demineralized. IMPRESSION: Probable CHF. Aortic Atherosclerosis (ICD10-I70.0). Electronically Signed   By: Lavonia Dana M.D.   On: 11/16/2021 12:19     Assessment and Plan:   VENISHA BOEHNING is a 88 y.o. female with a hx of acute on HTN, HLD, DM with recent STEMI s/p PCI to RCA who is being seen 11/16/2021 for the evaluation of chest pain at the request of Dr. Roosevelt Locks.  Dyspnea/chest discomfort with recent STEMI: reports feeling fatigued since being discharged home, intermittent episodes of chest discomfort. Has been complaint with her medications including DAPT. hsTn 967>>871 which is down trending from recent MI. Does has residual CAD, but would favor medical management at this time unless she develops dynamic EKG changes -- continue ASA, plavix, statin, zetia, losartan  Sinus Bradycardia/1st degree AVB: was started on coreg 12.5mg  BID during recent admission. Has felt  fatigued, but no dizziness or light-headedness. Noted to be bradycardic on telemetry into the 30s -- hold coreg for now for washout, follow on telemetry -- may need EP consult   CAD: s/p PCI to RCA. Has been complaint with home medications -- on ASA, plavix, statin, zetia   Volume overload/HFpEF: CXR with pulmonary edema, BNP 1161 -- has been given IV lasix in the ED, no UOP noted yet -- follow response -- may need to hold BP meds to allow for diuresis  HTN: stable, but on the soft side  HLD: on crestor 10mg  daily, has hx of statin intolerance -- on Zetia   DM: recent Hgb A1c 8.0 -- on jardiance    Risk Assessment/Risk Scores:   HEAR Score (for undifferentiated chest pain):  HEAR Score: 5{   New York Heart Association (NYHA) Functional Class NYHA Class III      For questions or updates, please contact [facility] Please consult [email or web address] for contact info under    Signed, Reino Bellis, NP  11/16/2021 3:05 PM   ATTENDING ATTESTATION:  After conducting a review of all available clinical information with the care team, interviewing the patient, and performing a physical exam, I agree with the findings and plan described in this note.   GEN: No acute distress.   Cardiac: RRR, no murmurs, rubs, or gallops.  Respiratory: Clear to auscultation bilaterally. GI: Soft, nontender, non-distended  MS: No edema; No deformity. Neuro:  Nonfocal  Vasc:  +2 radial pulses  Patient is a 88 year old female who was discharged on Monday after treatment for inferior ST elevation myocardial infarction.  Of note she has significant residual disease and decision was made to treat this medically.  Over the last few days she has not felt well.  She is unable to specify exactly what is ailing her.  She denies any significant chest pain.  For this reason she came to the emergency department for further evaluation.  Here in the emergency department she is bradycardic.  She endorses no chest  pain.  She will be admitted to the hospital and will be monitored.  Given  the need for a beta-blocker to treat her coronary artery disease and ongoing fatigue, an EP consult will be obtained.  Lenna Sciara, MD Pager [phone]

## 2021-11-16 NOTE — Consult Note (Addendum)
ELECTROPHYSIOLOGY CONSULT NOTE    Patient ID: Cathy Larsen MRN: [id number], DOB/AGE: [birth year] 88 y.o.  Admit date: 11/16/2021 Date of Consult: 11/16/2021  Primary Physician: Tonia Ghent, MD Primary Cardiologist: Mertie Moores, MD  Electrophysiologist:  New  Referring Provider: Dr. Ali Lowe  Patient Profile: Cathy Larsen is a 88 y.o. female with a history of HTN, HLD, DM with recent STEMI s/p PCI to RCA who is being seen today for the evaluation of bradycardia at the request of Dr. Ali Lowe.  HPI:  Cathy Larsen is a 88 y.o. female with medical history as above.   She was very recently admitted for an inferior STEMI with significant multivessel CAD with coronary calcification of 60% proximal LAD followed by 80% then 50% mid vessel, severe pLcx 90% followed by 85% focal, then 100% occlusion of dLcx with collaterals .  Successful PCI/DES x1 to 100% occluded mRCA and placed on DAPT with ASA/plavix. hsTn peaked >24000. Statin intolerant but agreeable to discharge on Crestor 10mg  daily, along with zetia. Her HCTZ was stopped, and placed on coreg 12.5mg  BID and losartan 50mg  daily. Hgb A1c was 8.1 and placed on jardiance.   Presented to the ED on today with complaints of left-sided chest discomfort, fatigue and shortness of breath.  Has felt very fatigue since leaving the hospital, compliant with her medications. Yesterday did have left-sided chest discomfort up underneath her breast.  She is unable to identify what has been bothering her the most. This morning reports feeling more short of breath and EMS was called. She lives with her grandson.    Pertinent labs on arrival include Na+ 139, K+ 4.5,Cr 1.47, albumin 3.3, hsTn 967, WBC 9.9, Hgb 11.5. CXR with pulmonary edema. EKG via EMS and on arrival was notable for evolving ST changes from recent inferior MI, bradycardia with rate of 39 bpm. Initially called by the EDP for concern of slight ST elevation in inferior leads. Briefly  evaluated by Dr. Ali Lowe in the ED confirming no significant ST elevation on EKGs with recommendations for further management via IM and cardiology consulting for medical management of her CAD. She has been given IV lasix 20mg  x1 while in the ED. In review of telemetry shows intermittent episodes of what appears to be 2:1 AVB in the 40s mostly, and intermittently NSR.    At the time of our interview she has no specific complaints other than fatigue and repeating the history above. She denies syncope.  She would prefer to avoid having a pacemaker if at all possible.  She has been taking all medications as prescribed. She has NOT taken medications today.    Past Medical History:  Diagnosis Date   Arthritis    feet and hands   CAD (coronary artery disease)    cath 1998 with 40% lesion in LAD but no other disease   Carpal tunnel syndrome    Diabetes mellitus    Gout 03/1998   Hyperlipidemia 08/1997   Hypertension 09/23/1995   Skin cancer      Surgical History:  Past Surgical History:  Procedure Laterality Date   BIOPSY BREAST  03/2000   Ductogram right breast w/ biopsy- obstructing lesion benign by path. (Dr. Annamaria Boots)    BLADDER SURGERY     bladder tack    CARDIAC CATHETERIZATION  03/1997   40% occulsion lad    CARPAL TUNNEL RELEASE Right 03/14/2014   Procedure: RIGHT CARPAL TUNNEL RELEASE;  Surgeon: Tennis Must, MD;  Location: [facility];  Service: Orthopedics;  Laterality: Right;   CATARACT EXTRACTION     CORONARY/GRAFT ACUTE MI REVASCULARIZATION N/A 11/10/2021   Procedure: Coronary/Graft Acute MI Revascularization;  Surgeon: Troy Sine, MD;  Location: [facility];  Service: Cardiovascular;  Laterality: N/A;   LEFT HEART CATH AND CORONARY ANGIOGRAPHY N/A 11/10/2021   Procedure: LEFT HEART CATH AND CORONARY ANGIOGRAPHY;  Surgeon: Troy Sine, MD;  Location: [facility];  Service: Cardiovascular;  Laterality: N/A;   PARTIAL HYSTERECTOMY     VAGINAL  DELIVERY     x 3   YAG LASER APPLICATION Right 03/07/6643   Procedure: YAG LASER APPLICATION;  Surgeon: Williams Che, MD;  Location: [facility];  Service: Ophthalmology;  Laterality: Right;     (Not in a hospital admission)   Inpatient Medications:   aspirin  324 mg Oral Once   aspirin EC  81 mg Oral Daily   clopidogrel  75 mg Oral Daily   empagliflozin  10 mg Oral Daily   ezetimibe  10 mg Oral Daily   heparin  5,000 Units Subcutaneous Q12H   insulin aspart  0-9 Units Subcutaneous TID WC   losartan  50 mg Oral Daily   magnesium oxide  200 mg Oral QHS   rosuvastatin  10 mg Oral Daily   sodium chloride flush  3 mL Intravenous Q12H    Allergies:  Allergies  Allergen Reactions   Statins Other (See Comments)    Myalgias.    Keflex [Cephalexin] Other (See Comments)    Migraine   Requip [Ropinirole Hcl] Other (See Comments)    headache    Social History   Socioeconomic History   Marital status: Married    Spouse name: Not on file   Number of children: 3   Years of education: Not on file   Highest education level: Not on file  Occupational History   Occupation: [employer]   Tobacco Use   Smoking status: Never   Smokeless tobacco: Never  Vaping Use   Vaping Use: Never used  Substance and Sexual Activity   Alcohol use: No    Alcohol/week: 0.0 standard drinks   Drug use: No   Sexual activity: Not Currently  Other Topics Concern   Not on file  Social History Narrative   Widowed 10/2012, husband had CVA and dementia   Grandson lives with patient as of 2018   Social Determinants of Health   Financial Resource Strain: Not on file  Food Insecurity: Not on file  Transportation Needs: Not on file  Physical Activity: Not on file  Stress: Not on file  Social Connections: Not on file  Intimate Partner Violence: Not on file     Family History  Problem Relation Age of Onset   Cancer Mother        breast    Kidney failure Mother    Cancer Father        colon    Cancer  Brother        bladder    Cancer Brother        lung   Hypertension Sister    Hypertension Sister    Diabetes Neg Hx    Stroke Neg Hx      Review of Systems: All other systems reviewed and are otherwise negative except as noted above.  Physical Exam: Vitals:   11/16/21 1253 11/16/21 1330 11/16/21 1400 11/16/21 1500  BP: 117/72 137/68 (!) 146/74 (!) 151/54  Pulse: (!) 37 Marland Kitchen)  39 (!) 41 (!) 40  Resp: 18 (!) 21 13 20   Temp:      SpO2: 93% 94% 95% 96%  Weight:      Height:        GEN- The patient is elderly and fatigued appearing, alert and oriented x 3 today.   HEENT: normocephalic, atraumatic; sclera clear, conjunctiva pink; hearing intact; oropharynx clear; neck supple Lungs- Clear to ausculation bilaterally, normal work of breathing.  No wheezes, rales, rhonchi Heart- Regular rate and rhythm, no murmurs, rubs or gallops GI- soft, non-tender, non-distended, bowel sounds present Extremities- no clubbing, cyanosis, or edema; DP/PT/radial pulses 2+ bilaterally MS- no significant deformity or atrophy Skin- warm and dry, no rash or lesion Psych- euthymic mood, full affect Neuro- strength and sensation are intact  Labs:   Lab Results  Component Value Date   WBC 9.9 11/16/2021   HGB 11.5 (L) 11/16/2021   HCT 36.0 11/16/2021   MCV 92.3 11/16/2021   PLT 326 11/16/2021    Recent Labs  Lab 11/16/21 1111  NA 139  K 4.5  CL 103  CO2 29  BUN 15  CREATININE 1.47*  CALCIUM 9.0  PROT 6.1*  BILITOT 0.7  ALKPHOS 64  ALT 24  AST 21  GLUCOSE 153*      Radiology/Studies: CARDIAC CATHETERIZATION  Result Date: 11/10/2021   Ost RCA to Prox RCA lesion is 30% stenosed.   Prox RCA lesion is 100% stenosed.   Dist RCA lesion is 50% stenosed.   Prox Cx lesion is 90% stenosed.   Mid Cx lesion is 85% stenosed.   Dist Cx lesion is 100% stenosed.   Ost LAD to Prox LAD lesion is 60% stenosed.   Mid LAD-1 lesion is 80% stenosed.   Mid LAD-2 lesion is 50% stenosed.   A drug-eluting stent  was successfully placed.   Post intervention, there is a 0% residual stenosis.   There is mild left ventricular systolic dysfunction.   LV end diastolic pressure is mildly elevated.   The left ventricular ejection fraction is 50-55% by visual estimate. Acute STEMI secondary to total proximal to mid occlusion of a large dominant RCA. Significant multivessel CAD with evidence for coronary calcification in the left coronary system with 60% proximal LAD stenosis followed by focal 80% stenosis with 50% mid stenosis; severe left circumflex stenoses of 90% proximally followed by focal 85% and then total occlusion of the distal circumflex after a moderate sized marginal vessel and total occlusion of the proximal to mid RCA. Mild LV dysfunction with EF estimated approximately 50% with mid to basal inferior hypocontractility.  LVEDP 21 mmHg. Successful PCI to the totally occluded RCA with ultimate insertion of a 3.5 x 22 mm Medtronic Onyx Frontier stent postdilated to 3.55 mm with the 100% occlusion being reduced to 0%.  There is evidence for 50% stenosis in the distal RCA proximal to the PDA takeoff. RECOMMENDATION: DAPT for minimum of 1 year.  The patient has significant disease involving her left coronary system.  Kyrstan Gotwalt review with colleagues regarding medical therapy versus attempt at circumflex intervention.  The patient reportedly has an allergy to statin.  Thorin Starner initiate Zetia.  However may consider rechallenge low-dose statin if myalgias were only an issue.  Optimal blood pressure control.   DG Chest Port 1 View  Result Date: 11/16/2021 CLINICAL DATA:  Shortness of breath EXAM: PORTABLE CHEST 1 VIEW COMPARISON:  Portable exam 1108 hours compared 07/04/2012 FINDINGS: Enlargement of cardiac silhouette with pulmonary vascular congestion. Atherosclerotic  calcification aorta. Interstitial infiltrates mid to lower lungs favor pulmonary edema. No pleural effusion or pneumothorax. Bones demineralized. IMPRESSION: Probable  CHF. Aortic Atherosclerosis (ICD10-I70.0). Electronically Signed   By: Lavonia Dana M.D.   On: 11/16/2021 12:19   ECHOCARDIOGRAM COMPLETE  Result Date: 11/11/2021    ECHOCARDIOGRAM REPORT   Patient Name:   DONTAVIA BRAND Date of Exam: 11/11/2021 Medical Rec #:  [id number]      Height:       63.0 in Accession #:    [id number]     Weight:       192.7 lb Date of Birth:  [birth year]       BSA:          1.903 m Patient Age:    71 years       BP:           136/63 mmHg Patient Gender: F              HR:           82 bpm. Exam Location:  Inpatient Procedure: 2D Echo, Cardiac Doppler and Color Doppler Indications:    Acute myocardial infarction, unspecified I21.9  History:        Patient has prior history of Echocardiogram examinations, most                 recent 03/05/2017. CAD and Acute MI; Cardiac cath yesterday.  Sonographer:    Merrie Roof RDCS Referring Phys: [place]  1. Left ventricular ejection fraction, by estimation, is 60 to 65%. The left ventricle has normal function. The left ventricle demonstrates regional wall motion abnormalities (see scoring diagram/findings for description). There is mild concentric left ventricular hypertrophy. Indeterminate diastolic filling due to E-A fusion. There is mild hypokinesis of the left ventricular, basal inferior wall.  2. Right ventricular systolic function is normal. The right ventricular size is normal. There is moderately elevated pulmonary artery systolic pressure. The estimated right ventricular systolic pressure is 22.4 mmHg.  3. Left atrial size was moderately dilated.  4. Right atrial size was mild to moderately dilated.  5. The mitral valve is grossly normal. Moderate mitral valve regurgitation.  6. The aortic valve is grossly normal. There is mild calcification of the aortic valve. Aortic valve regurgitation is trivial. No aortic stenosis is present.  7. Aortic dilatation noted. There is borderline dilatation of the ascending aorta, measuring 38  mm.  8. The inferior vena cava is dilated in size with <50% respiratory variability, suggesting right atrial pressure of 15 mmHg. Comparison(s): Changes from prior study are noted. Conclusion(s)/Recommendation(s): Mild hypokinesis of basal inferior wall with overall preserved LVEF. Moderate MR. FINDINGS  Left Ventricle: Left ventricular ejection fraction, by estimation, is 60 to 65%. The left ventricle has normal function. The left ventricle demonstrates regional wall motion abnormalities. Mild hypokinesis of the left ventricular, basal inferior wall. The left ventricular internal cavity size was normal in size. There is mild concentric left ventricular hypertrophy. Indeterminate diastolic filling due to E-A fusion. Right Ventricle: The right ventricular size is normal. No increase in right ventricular wall thickness. Right ventricular systolic function is normal. There is moderately elevated pulmonary artery systolic pressure. The tricuspid regurgitant velocity is 2.85 m/s, and with an assumed right atrial pressure of 15 mmHg, the estimated right ventricular systolic pressure is 82.5 mmHg. Left Atrium: Left atrial size was moderately dilated. Right Atrium: Right atrial size was mild to moderately dilated. Pericardium: There is no evidence of  pericardial effusion. Presence of epicardial fat layer. Mitral Valve: The mitral valve is grossly normal. Mild to moderate mitral annular calcification. Moderate mitral valve regurgitation. Tricuspid Valve: The tricuspid valve is normal in structure. Tricuspid valve regurgitation is mild . No evidence of tricuspid stenosis. Aortic Valve: The aortic valve is grossly normal. There is mild calcification of the aortic valve. Aortic valve regurgitation is trivial. No aortic stenosis is present. Aortic valve mean gradient measures 2.0 mmHg. Aortic valve peak gradient measures 4.0  mmHg. Aortic valve area, by VTI measures 2.29 cm. Pulmonic Valve: The pulmonic valve was not well  visualized. Pulmonic valve regurgitation is trivial. No evidence of pulmonic stenosis. Aorta: Aortic dilatation noted. There is borderline dilatation of the ascending aorta, measuring 38 mm. Venous: The inferior vena cava is dilated in size with less than 50% respiratory variability, suggesting right atrial pressure of 15 mmHg. IAS/Shunts: The atrial septum is grossly normal.  LEFT VENTRICLE PLAX 2D LVIDd:         3.90 cm LVIDs:         2.60 cm LV PW:         1.30 cm LV IVS:        1.20 cm LVOT diam:     1.80 cm LV SV:         44 LV SV Index:   23 LVOT Area:     2.54 cm  RIGHT VENTRICLE          IVC RV Basal diam:  3.30 cm  IVC diam: 2.40 cm LEFT ATRIUM             Index        RIGHT ATRIUM           Index LA diam:        4.30 cm 2.26 cm/m   RA Area:     17.50 cm LA Vol (A2C):   95.4 ml 50.12 ml/m  RA Volume:   40.30 ml  21.17 ml/m LA Vol (A4C):   73.0 ml 38.36 ml/m LA Biplane Vol: 84.8 ml 44.56 ml/m  AORTIC VALVE AV Area (Vmax):    2.31 cm AV Area (Vmean):   2.22 cm AV Area (VTI):     2.29 cm AV Vmax:           99.50 cm/s AV Vmean:          66.600 cm/s AV VTI:            0.192 m AV Peak Grad:      4.0 mmHg AV Mean Grad:      2.0 mmHg LVOT Vmax:         90.30 cm/s LVOT Vmean:        58.100 cm/s LVOT VTI:          0.173 m LVOT/AV VTI ratio: 0.90  AORTA Ao Root diam: 3.70 cm Ao Asc diam:  3.80 cm MITRAL VALVE                  TRICUSPID VALVE MV Area (PHT): 4.21 cm       TR Peak grad:   32.5 mmHg MV Decel Time: 180 msec       TR Vmax:        285.00 cm/s MR Peak grad:    164.9 mmHg MR Mean grad:    101.0 mmHg   SHUNTS MR Vmax:         642.00 cm/s  Systemic VTI:  0.17 m MR Vmean:  472.0 cm/s   Systemic Diam: 1.80 cm MR PISA:         3.08 cm MR PISA Eff ROA: 17 mm MR PISA Radius:  0.70 cm MV E velocity: 114.00 cm/s MV A velocity: 127.00 cm/s MV E/A ratio:  0.90 Buford Dresser MD Electronically signed by Buford Dresser MD Signature Date/Time: 11/11/2021/6:43:15 PM    Final     EKG: on  arrival shows NSR, second EKG compared to telemetry does appear to show 2:1 AVB in the 40s (personally reviewed)  TELEMETRY: 2:1 AVB in the 40s, intermittently 70-90s (personally reviewed)  Assessment/Plan: 1.  Second degree heart block Appears to be improving as Coreg wanes, with last dose being last night.  Discussed with patient. She would prefer to avoid pacing if possible and let BB wash out to see how her HR does.  She understands if her HR does not improve despite BB wash out, she Mesha Schamberger likely require pacing.   2. CAD s/p PCI Continue other medications Avoid AV blockers, at least for time being.  It is possible she would tolerate lower doses without off target effects.    For questions or updates, please contact [place] Please consult [email or web address] for contact info under Cardiology/STEMI.  Signed, Shirley Friar, PA-C  11/16/2021 4:09 PM   I have seen and examined this patient with Oda Kilts.  Agree with above, note added to reflect my findings.  Heart disease with recent STEMI.  She was discharged on 12.5 mg of carvedilol.  She presented to the emergency room with fatigue.  She was found to be in 2-1 AV block.  GEN: Well nourished, well developed, in no acute distress  HEENT: normal  Neck: no JVD, carotid bruits, or masses Cardiac: Bradycardic; no murmurs, rubs, or gallops,no edema  Respiratory:  clear to auscultation bilaterally, normal work of breathing GI: soft, nontender, nondistended, + BS MS: no deformity or atrophy  Skin: warm and dry Neuro:  Strength and sensation are intact Psych: euthymic mood, full affect   2:1 AV block: Likely as a consequence of her carvedilol.  After holding her dose, she has had some recovery in her conduction.  In speaking with the patient, she would like to avoid pacemaker implant if at all possible.  We Sunshine Mackowski continue to hold her carvedilol for now.  If she does need a beta-blocker for her coronary artery disease, we Daray Polgar  try a lower dose.  If she does continue to have episodes of heart block, pacemaker implant may be indicated.  Thamara Leger M. Ruben Mahler MD 11/16/2021 4:26 PM

## 2021-11-16 NOTE — ED Triage Notes (Signed)
PT to ER via EMS from home with reports of CP.  Pt was seen here 1 week ago and treated for MI, pt received one stent,  noted other blockages and was started on beta blocker.  Pt was discharged on Tuesday and has continued to have intermittent chest pain since that time.  This AM pt was awoken from sleep with the pain and states that it was worse than her normal pain.  Was given 1 NTG by family with resolution of pain.  Per EMS pt noted to be in intermittent 2nd degree type 2 HB.

## 2021-11-16 NOTE — ED Provider Notes (Signed)
[facility] EMERGENCY DEPARTMENT Provider Note   CSN: [id number] Arrival date & time: 11/16/21  1040     History  Chief Complaint  Patient presents with   Chest Pain    Cathy Larsen is a 87 y.o. female.  Patient c/o discomfort 'all over', generally felt poorly, mild sob - symptoms acute onset this AM at rest. Recent admission for stemi, cath/stent. Pt unsure if current symptoms similar or not. Per ems report, pt has c/o chest pain intermittently since recent hospital discharge. Patient denies cough or uri symptoms. No fever or chills. No abd pain or  nv. No leg pain or swelling.   The history is provided by the patient, medical records and the EMS personnel.  Chest Pain Associated symptoms: shortness of breath   Associated symptoms: no abdominal pain, no back pain, no cough, no fever, no headache and no palpitations       Home Medications Prior to Admission medications   Medication Sig Start Date End Date Taking? Authorizing Provider  aspirin EC 81 MG tablet Take 1 tablet (81 mg total) by mouth daily. Swallow whole. 11/12/21 11/12/22  Troy Sine, MD  Carboxymethylcellulose Sodium (THERATEARS) 0.25 % SOLN Place 1 drop into both eyes at bedtime.    [provider]  carvedilol (COREG) 12.5 MG tablet Take 1 tablet (12.5 mg total) by mouth 2 (two) times daily with a meal. 11/12/21   Troy Sine, MD  clopidogrel (PLAVIX) 75 MG tablet Take 1 tablet (75 mg total) by mouth daily. 11/13/21   Troy Sine, MD  empagliflozin (JARDIANCE) 10 MG TABS tablet Take 1 tablet (10 mg total) by mouth daily. 11/13/21   Troy Sine, MD  ezetimibe (ZETIA) 10 MG tablet Take 1 tablet (10 mg total) by mouth daily. 11/13/21   Troy Sine, MD  famotidine (PEPCID) 10 MG tablet Take 10 mg by mouth once.    [provider]  Ginger, Zingiber officinalis, (GINGER ROOT) 500 MG CAPS Take 500 mg by mouth at bedtime.    [provider]  glucose blood (ONE TOUCH  ULTRA TEST) test strip USE DAILY TO CHECK SUGAR.  Dx 11.9. 03/16/21   Tonia Ghent, MD  losartan (COZAAR) 50 MG tablet Take 1 tablet (50 mg total) by mouth daily. 09/20/21   Tonia Ghent, MD  Magnesium 200 MG TABS Take 200 mg by mouth at bedtime.    [provider]  Multiple Vitamin (MULTIVITAMIN WITH MINERALS) TABS Take 1 tablet by mouth at bedtime.    [provider]  nitroGLYCERIN (NITROSTAT) 0.4 MG SL tablet Place 1 tablet (0.4 mg total) under the tongue every 5 (five) minutes as needed for chest pain (max 3 tabs in 15 minutes). 09/13/19   Tonia Ghent, MD  Omega 3-6-9 Fatty Acids (OMEGA-3-6-9 PO) Take 2,400 mg by mouth 3 (three) times a week. liquid    [provider]  Omega-3 Fatty Acids (FISH OIL) 1200 MG CAPS Take 1,200 mg by mouth daily.    [provider]  rosuvastatin (CRESTOR) 10 MG tablet Take 1 tablet (10 mg total) by mouth daily. 11/13/21   Troy Sine, MD  Turmeric 500 MG CAPS Take 500 mg by mouth at bedtime.    [provider]      Allergies    Statins, Keflex [cephalexin], and Requip [ropinirole hcl]    Review of Systems   Review of Systems  Constitutional:  Negative for fever.  HENT:  Negative for sore throat.   Eyes:  Negative for redness.  Respiratory:  Positive for shortness of breath. Negative for cough.   Cardiovascular:  Positive for chest pain. Negative for palpitations and leg swelling.  Gastrointestinal:  Negative for abdominal pain.  Genitourinary:  Negative for flank pain.  Musculoskeletal:  Negative for back pain and neck pain.  Skin:  Negative for rash.  Neurological:  Negative for headaches.  Hematological:  Does not bruise/bleed easily.  Psychiatric/Behavioral:  Negative for confusion.    Physical Exam Updated Vital Signs  HR: 60, RR 18, pulse ox:95%, BP  122/76, t  99.  Ht 1.6 m (5\' 3" )    Wt 87 kg    BMI 33.98 kg/m  Physical Exam Vitals and nursing note reviewed.  Constitutional:       Appearance: Normal appearance. She is well-developed.  HENT:     Head: Atraumatic.     Nose: Nose normal.     Mouth/Throat:     Mouth: Mucous membranes are moist.  Eyes:     General: No scleral icterus.    Conjunctiva/sclera: Conjunctivae normal.     Pupils: Pupils are equal, round, and reactive to light.  Neck:     Trachea: No tracheal deviation.  Cardiovascular:     Rate and Rhythm: Normal rate and regular rhythm.     Pulses: Normal pulses.     Heart sounds: Normal heart sounds. No murmur heard.   No friction rub. No gallop.  Pulmonary:     Effort: Pulmonary effort is normal. No respiratory distress.     Breath sounds: Normal breath sounds.  Abdominal:     General: Bowel sounds are normal. There is no distension.     Palpations: Abdomen is soft.     Tenderness: There is no abdominal tenderness. There is no guarding.  Genitourinary:    Comments: No cva tenderness.  Musculoskeletal:        General: No swelling.     Cervical back: Normal range of motion and neck supple. No rigidity. No muscular tenderness.  Skin:    General: Skin is warm and dry.     Findings: No rash.  Neurological:     Mental Status: She is alert.     Comments: Alert, speech normal.   Psychiatric:        Mood and Affect: Mood normal.    ED Results / Procedures / Treatments   Labs (all labs ordered are listed, but only abnormal results are displayed) Results for orders placed or performed during the hospital encounter of 11/16/21  Resp Panel by RT-PCR (Flu A&B, Covid) Nasopharyngeal Swab   Specimen: Nasopharyngeal Swab; Nasopharyngeal(NP) swabs in vial transport medium  Result Value Ref Range   SARS Coronavirus 2 by RT PCR NEGATIVE NEGATIVE   Influenza A by PCR NEGATIVE NEGATIVE   Influenza B by PCR NEGATIVE NEGATIVE  Comprehensive metabolic panel  Result Value Ref Range   Sodium 139 135 - 145 mmol/L   Potassium 4.5 3.5 - 5.1 mmol/L   Chloride 103 98 - 111 mmol/L   CO2 29 22 - 32 mmol/L    Glucose, Bld 153 (H) 70 - 99 mg/dL   BUN 15 8 - 23 mg/dL   Creatinine, Ser 1.47 (H) 0.44 - 1.00 mg/dL   Calcium 9.0 8.9 - 10.3 mg/dL   Total Protein 6.1 (L) 6.5 - 8.1 g/dL   Albumin 3.3 (L) 3.5 - 5.0 g/dL   AST 21 15 - 41 U/L   ALT  24 0 - 44 U/L   Alkaline Phosphatase 64 38 - 126 U/L   Total Bilirubin 0.7 0.3 - 1.2 mg/dL   GFR, Estimated 33 (L) >60 mL/min   Anion gap 7 5 - 15  CBC  Result Value Ref Range   WBC 9.9 4.0 - 10.5 K/uL   RBC 3.90 3.87 - 5.11 MIL/uL   Hemoglobin 11.5 (L) 12.0 - 15.0 g/dL   HCT 36.0 36.0 - 46.0 %   MCV 92.3 80.0 - 100.0 fL   MCH 29.5 26.0 - 34.0 pg   MCHC 31.9 30.0 - 36.0 g/dL   RDW 13.2 11.5 - 15.5 %   Platelets 326 150 - 400 K/uL   nRBC 0.0 0.0 - 0.2 %  Troponin I (High Sensitivity)  Result Value Ref Range   Troponin I (High Sensitivity) 967 (HH) <18 ng/L   CARDIAC CATHETERIZATION  Result Date: 11/10/2021   Ost RCA to Prox RCA lesion is 30% stenosed.   Prox RCA lesion is 100% stenosed.   Dist RCA lesion is 50% stenosed.   Prox Cx lesion is 90% stenosed.   Mid Cx lesion is 85% stenosed.   Dist Cx lesion is 100% stenosed.   Ost LAD to Prox LAD lesion is 60% stenosed.   Mid LAD-1 lesion is 80% stenosed.   Mid LAD-2 lesion is 50% stenosed.   A drug-eluting stent was successfully placed.   Post intervention, there is a 0% residual stenosis.   There is mild left ventricular systolic dysfunction.   LV end diastolic pressure is mildly elevated.   The left ventricular ejection fraction is 50-55% by visual estimate. Acute STEMI secondary to total proximal to mid occlusion of a large dominant RCA. Significant multivessel CAD with evidence for coronary calcification in the left coronary system with 60% proximal LAD stenosis followed by focal 80% stenosis with 50% mid stenosis; severe left circumflex stenoses of 90% proximally followed by focal 85% and then total occlusion of the distal circumflex after a moderate sized marginal vessel and total occlusion of the proximal  to mid RCA. Mild LV dysfunction with EF estimated approximately 50% with mid to basal inferior hypocontractility.  LVEDP 21 mmHg. Successful PCI to the totally occluded RCA with ultimate insertion of a 3.5 x 22 mm Medtronic Onyx Frontier stent postdilated to 3.55 mm with the 100% occlusion being reduced to 0%.  There is evidence for 50% stenosis in the distal RCA proximal to the PDA takeoff. RECOMMENDATION: DAPT for minimum of 1 year.  The patient has significant disease involving her left coronary system.  Will review with colleagues regarding medical therapy versus attempt at circumflex intervention.  The patient reportedly has an allergy to statin.  Will initiate Zetia.  However may consider rechallenge low-dose statin if myalgias were only an issue.  Optimal blood pressure control.   DG Chest Port 1 View  Result Date: 11/16/2021 CLINICAL DATA:  Shortness of breath EXAM: PORTABLE CHEST 1 VIEW COMPARISON:  Portable exam 1108 hours compared 07/04/2012 FINDINGS: Enlargement of cardiac silhouette with pulmonary vascular congestion. Atherosclerotic calcification aorta. Interstitial infiltrates mid to lower lungs favor pulmonary edema. No pleural effusion or pneumothorax. Bones demineralized. IMPRESSION: Probable CHF. Aortic Atherosclerosis (ICD10-I70.0). Electronically Signed   By: Lavonia Dana M.D.   On: 11/16/2021 12:19   ECHOCARDIOGRAM COMPLETE  Result Date: 11/11/2021    ECHOCARDIOGRAM REPORT   Patient Name:   ARIONA DESCHENE Date of Exam: 11/11/2021 Medical Rec #:  [id number]      Height:  63.0 in Accession #:    [id number]     Weight:       192.7 lb Date of Birth:  [birth year]       BSA:          1.903 m Patient Age:    22 years       BP:           136/63 mmHg Patient Gender: F              HR:           82 bpm. Exam Location:  Inpatient Procedure: 2D Echo, Cardiac Doppler and Color Doppler Indications:    Acute myocardial infarction, unspecified I21.9  History:        Patient has prior history of  Echocardiogram examinations, most                 recent 03/05/2017. CAD and Acute MI; Cardiac cath yesterday.  Sonographer:    Merrie Roof RDCS Referring Phys: [place]  1. Left ventricular ejection fraction, by estimation, is 60 to 65%. The left ventricle has normal function. The left ventricle demonstrates regional wall motion abnormalities (see scoring diagram/findings for description). There is mild concentric left ventricular hypertrophy. Indeterminate diastolic filling due to E-A fusion. There is mild hypokinesis of the left ventricular, basal inferior wall.  2. Right ventricular systolic function is normal. The right ventricular size is normal. There is moderately elevated pulmonary artery systolic pressure. The estimated right ventricular systolic pressure is 71.6 mmHg.  3. Left atrial size was moderately dilated.  4. Right atrial size was mild to moderately dilated.  5. The mitral valve is grossly normal. Moderate mitral valve regurgitation.  6. The aortic valve is grossly normal. There is mild calcification of the aortic valve. Aortic valve regurgitation is trivial. No aortic stenosis is present.  7. Aortic dilatation noted. There is borderline dilatation of the ascending aorta, measuring 38 mm.  8. The inferior vena cava is dilated in size with <50% respiratory variability, suggesting right atrial pressure of 15 mmHg. Comparison(s): Changes from prior study are noted. Conclusion(s)/Recommendation(s): Mild hypokinesis of basal inferior wall with overall preserved LVEF. Moderate MR. FINDINGS  Left Ventricle: Left ventricular ejection fraction, by estimation, is 60 to 65%. The left ventricle has normal function. The left ventricle demonstrates regional wall motion abnormalities. Mild hypokinesis of the left ventricular, basal inferior wall. The left ventricular internal cavity size was normal in size. There is mild concentric left ventricular hypertrophy. Indeterminate diastolic filling  due to E-A fusion. Right Ventricle: The right ventricular size is normal. No increase in right ventricular wall thickness. Right ventricular systolic function is normal. There is moderately elevated pulmonary artery systolic pressure. The tricuspid regurgitant velocity is 2.85 m/s, and with an assumed right atrial pressure of 15 mmHg, the estimated right ventricular systolic pressure is 96.7 mmHg. Left Atrium: Left atrial size was moderately dilated. Right Atrium: Right atrial size was mild to moderately dilated. Pericardium: There is no evidence of pericardial effusion. Presence of epicardial fat layer. Mitral Valve: The mitral valve is grossly normal. Mild to moderate mitral annular calcification. Moderate mitral valve regurgitation. Tricuspid Valve: The tricuspid valve is normal in structure. Tricuspid valve regurgitation is mild . No evidence of tricuspid stenosis. Aortic Valve: The aortic valve is grossly normal. There is mild calcification of the aortic valve. Aortic valve regurgitation is trivial. No aortic stenosis is present. Aortic valve mean gradient measures 2.0 mmHg. Aortic valve peak  gradient measures 4.0  mmHg. Aortic valve area, by VTI measures 2.29 cm. Pulmonic Valve: The pulmonic valve was not well visualized. Pulmonic valve regurgitation is trivial. No evidence of pulmonic stenosis. Aorta: Aortic dilatation noted. There is borderline dilatation of the ascending aorta, measuring 38 mm. Venous: The inferior vena cava is dilated in size with less than 50% respiratory variability, suggesting right atrial pressure of 15 mmHg. IAS/Shunts: The atrial septum is grossly normal.  LEFT VENTRICLE PLAX 2D LVIDd:         3.90 cm LVIDs:         2.60 cm LV PW:         1.30 cm LV IVS:        1.20 cm LVOT diam:     1.80 cm LV SV:         44 LV SV Index:   23 LVOT Area:     2.54 cm  RIGHT VENTRICLE          IVC RV Basal diam:  3.30 cm  IVC diam: 2.40 cm LEFT ATRIUM             Index        RIGHT ATRIUM            Index LA diam:        4.30 cm 2.26 cm/m   RA Area:     17.50 cm LA Vol (A2C):   95.4 ml 50.12 ml/m  RA Volume:   40.30 ml  21.17 ml/m LA Vol (A4C):   73.0 ml 38.36 ml/m LA Biplane Vol: 84.8 ml 44.56 ml/m  AORTIC VALVE AV Area (Vmax):    2.31 cm AV Area (Vmean):   2.22 cm AV Area (VTI):     2.29 cm AV Vmax:           99.50 cm/s AV Vmean:          66.600 cm/s AV VTI:            0.192 m AV Peak Grad:      4.0 mmHg AV Mean Grad:      2.0 mmHg LVOT Vmax:         90.30 cm/s LVOT Vmean:        58.100 cm/s LVOT VTI:          0.173 m LVOT/AV VTI ratio: 0.90  AORTA Ao Root diam: 3.70 cm Ao Asc diam:  3.80 cm MITRAL VALVE                  TRICUSPID VALVE MV Area (PHT): 4.21 cm       TR Peak grad:   32.5 mmHg MV Decel Time: 180 msec       TR Vmax:        285.00 cm/s MR Peak grad:    164.9 mmHg MR Mean grad:    101.0 mmHg   SHUNTS MR Vmax:         642.00 cm/s  Systemic VTI:  0.17 m MR Vmean:        472.0 cm/s   Systemic Diam: 1.80 cm MR PISA:         3.08 cm MR PISA Eff ROA: 17 mm MR PISA Radius:  0.70 cm MV E velocity: 114.00 cm/s MV A velocity: 127.00 cm/s MV E/A ratio:  0.90 Buford Dresser MD Electronically signed by Buford Dresser MD Signature Date/Time: 11/11/2021/6:43:15 PM    Final     EKG EKG Interpretation  Date/Time:  Friday November 16 2021 10:55:05 EST Ventricular Rate:  39 PR Interval:  292 QRS Duration: 89 QT Interval:  414 QTC Calculation: 334 R Axis:   -28 Text Interpretation: Sinus bradycardia Atrial premature complexes Prolonged PR interval Borderline left axis deviation `?sl st elev III, AVF Borderline repolarization abnormality Nonspecific T wave abnormality Confirmed by Lajean Saver [id number]) on 11/16/2021 11:05:06 AM  Radiology DG Chest Port 1 View  Result Date: 11/16/2021 CLINICAL DATA:  Shortness of breath EXAM: PORTABLE CHEST 1 VIEW COMPARISON:  Portable exam 1108 hours compared 07/04/2012 FINDINGS: Enlargement of cardiac silhouette with pulmonary vascular  congestion. Atherosclerotic calcification aorta. Interstitial infiltrates mid to lower lungs favor pulmonary edema. No pleural effusion or pneumothorax. Bones demineralized. IMPRESSION: Probable CHF. Aortic Atherosclerosis (ICD10-I70.0). Electronically Signed   By: Lavonia Dana M.D.   On: 11/16/2021 12:19    Procedures Procedures    Medications Ordered in ED Medications  0.9 %  sodium chloride infusion (has no administration in time range)  aspirin chewable tablet 324 mg (has no administration in time range)  heparin injection 4,000 Units (has no administration in time range)    ED Course/ Medical Decision Making/ A&P                           Medical Decision Making Problems Addressed: Acute coronary syndrome [facility]): acute illness or injury with systemic symptoms that poses a threat to life or bodily functions AV heart block: acute illness or injury with systemic symptoms that poses a threat to life or bodily functions Symptomatic bradycardia: acute illness or injury with systemic symptoms that poses a threat to life or bodily functions Unstable angina [facility]): acute illness or injury that poses a threat to life or bodily functions  Amount and/or Complexity of Data Reviewed Independent Historian: parent, caregiver and EMS External Data Reviewed: labs, radiology, ECG and notes. Labs: ordered. Radiology: ordered and independent interpretation performed. Decision-making details documented in ED Course. ECG/medicine tests: ordered and independent interpretation performed. Decision-making details documented in ED Course.  Risk Parenteral controlled substances. Drug therapy requiring intensive monitoring for toxicity. Decision regarding hospitalization. Decision not to resuscitate or to de-escalate care because of poor prognosis. Risk Details: Discussed potential cardiac cath and/or pacemaker, considered as tx option  Cardiology indicates despite symptoms, ecg, trop - given advanced age,  recent cath, goals of management/care - no indication for emergent cath currently.    Iv ns. Continuous pulse ox and cardiac monitoring. Stat ecg. Stat labs and imaging ordered.   Reviewed nursing notes and prior charts for additional history. External reports and recent cath lab report reviewed.   Chewable asa. Heparin iv per pharmacy consult.   Cardiology emergently consulted as concerning for acute ischemia, ED and EMS ecg with ? Very slight ST elev III and AVF ?possible stemi vs nstemi.  They will be right down to see - indicate for now, do not activate cath lab as possible med management case.   Labs reviewed/interpreted by me - trop high, given recent stemi and very high trops then, will need to trend.   CXR reviewed/interpreted by me - no pna. ?vascular congestion.   Discussed pt with cardiology, Dr Ali Lowe, who evaluated patient in ED, discussed pt, ecgs, bradycardia, etc - he requests medical team admission, trend trops, monitor for brady, hold bblocker, and they will follow in consult.   Reassess pt x several - no current/active chest pain, c/o generally feeling weak/fatigued.   Hospitalists consulted for  admission - discussed pt, labs and imaging - will admit.   CRITICAL CARE  RE: ACS/unstable angina, symptomatic bradycardia/HR in 77s, elevated troponin Performed by: Mirna Mires Total critical care time: 45 minutes Critical care time was exclusive of separately billable procedures and treating other patients. Critical care was necessary to treat or prevent imminent or life-threatening deterioration. Critical care was time spent personally by me on the following activities: development of treatment plan with patient and/or surrogate as well as nursing, discussions with consultants, evaluation of patient's response to treatment, examination of patient, obtaining history from patient or surrogate, ordering and performing treatments and interventions, ordering and review of  laboratory studies, ordering and review of radiographic studies, pulse oximetry and re-evaluation of patient's condition.           Final Clinical Impression(s) / ED Diagnoses Final diagnoses:  None    Rx / DC Orders ED Discharge Orders     None         Lajean Saver, MD 11/16/21 1249

## 2021-11-16 NOTE — H&P (Addendum)
History and Physical    [name]:[id number] DOB: [birth year] DOA: 11/16/2021  PCP: Tonia Ghent, MD (Confirm with patient/family/NH records and if not entered, this has to be entered at [facility] point of entry) Patient coming from: Home  I have personally briefly reviewed patient's old medical records in [place]  Chief Complaint: Can not take deep breath  HPI: Cathy Larsen is a 88 y.o. female with medical history significant of recent STEMI s/p PCI and new RCA stenting, HTN, CKD stage IIIb, chronic diastolic CHF, HLD, came with worsening of SOB and feeling dizzy and fatigue.  Patient was discharged home over the weekend and has been feeling fine.  She was started on Coreg this Monday and since then, she has had episodes of feeling lightheaded and blurry vision, denied any loss of consciousness or fall.  Also she has had increasing shortness of breath, feeling " cannot take deep breath" denies any chest pain, no cough or wheezing.  Denies any peripheral edema.  Patient also complaining about generalized weakness and easy Fatigue, denied any muscle ache.  ED Course: Patient was found to have episode of bradycardia, blood pressure stable.  Also saturation within normal limits.  Chest x-ray showed pulmonary congestion.  Troponin 967, creatinine 1.4  Review of Systems: As per HPI otherwise 14 point review of systems negative.    Past Medical History:  Diagnosis Date   Arthritis    feet and hands   CAD (coronary artery disease)    cath 1998 with 40% lesion in LAD but no other disease   Carpal tunnel syndrome    Diabetes mellitus    Gout 03/1998   Hyperlipidemia 08/1997   Hypertension 09/23/1995   Skin cancer     Past Surgical History:  Procedure Laterality Date   BIOPSY BREAST  03/2000   Ductogram right breast w/ biopsy- obstructing lesion benign by path. (Dr. Annamaria Boots)    BLADDER SURGERY     bladder tack    CARDIAC CATHETERIZATION  03/1997   40% occulsion lad     CARPAL TUNNEL RELEASE Right 03/14/2014   Procedure: RIGHT CARPAL TUNNEL RELEASE;  Surgeon: Tennis Must, MD;  Location: [place];  Service: Orthopedics;  Laterality: Right;   CATARACT EXTRACTION     CORONARY/GRAFT ACUTE MI REVASCULARIZATION N/A 11/10/2021   Procedure: Coronary/Graft Acute MI Revascularization;  Surgeon: Troy Sine, MD;  Location: [place] [facility];  Service: Cardiovascular;  Laterality: N/A;   LEFT HEART CATH AND CORONARY ANGIOGRAPHY N/A 11/10/2021   Procedure: LEFT HEART CATH AND CORONARY ANGIOGRAPHY;  Surgeon: Troy Sine, MD;  Location: [place] [facility];  Service: Cardiovascular;  Laterality: N/A;   PARTIAL HYSTERECTOMY     VAGINAL DELIVERY     x 3   YAG LASER APPLICATION Right 7/0/9628   Procedure: YAG LASER APPLICATION;  Surgeon: Williams Che, MD;  Location: [facility];  Service: Ophthalmology;  Laterality: Right;     reports that she has never smoked. She has never used smokeless tobacco. She reports that she does not drink alcohol and does not use drugs.  Allergies  Allergen Reactions   Statins Other (See Comments)    Myalgias.    Keflex [Cephalexin] Other (See Comments)    Migraine   Requip [Ropinirole Hcl] Other (See Comments)    headache    Family History  Problem Relation Age of Onset   Cancer Mother        breast  Kidney failure Mother    Cancer Father        colon    Cancer Brother        bladder    Cancer Brother        lung   Hypertension Sister    Hypertension Sister    Diabetes Neg Hx    Stroke Neg Hx      Prior to Admission medications   Medication Sig Start Date End Date Taking? Authorizing Provider  aspirin EC 81 MG tablet Take 1 tablet (81 mg total) by mouth daily. Swallow whole. 11/12/21 11/12/22  Troy Sine, MD  Carboxymethylcellulose Sodium (THERATEARS) 0.25 % SOLN Place 1 drop into both eyes at bedtime.    [provider]  carvedilol (COREG) 12.5 MG tablet Take 1 tablet (12.5 mg total) by  mouth 2 (two) times daily with a meal. 11/12/21   Troy Sine, MD  clopidogrel (PLAVIX) 75 MG tablet Take 1 tablet (75 mg total) by mouth daily. 11/13/21   Troy Sine, MD  empagliflozin (JARDIANCE) 10 MG TABS tablet Take 1 tablet (10 mg total) by mouth daily. 11/13/21   Troy Sine, MD  ezetimibe (ZETIA) 10 MG tablet Take 1 tablet (10 mg total) by mouth daily. 11/13/21   Troy Sine, MD  famotidine (PEPCID) 10 MG tablet Take 10 mg by mouth once.    [provider]  Ginger, Zingiber officinalis, (GINGER ROOT) 500 MG CAPS Take 500 mg by mouth at bedtime.    [provider]  glucose blood (ONE TOUCH ULTRA TEST) test strip USE DAILY TO CHECK SUGAR.  Dx 11.9. 03/16/21   Tonia Ghent, MD  losartan (COZAAR) 50 MG tablet Take 1 tablet (50 mg total) by mouth daily. 09/20/21   Tonia Ghent, MD  Magnesium 200 MG TABS Take 200 mg by mouth at bedtime.    [provider]  Multiple Vitamin (MULTIVITAMIN WITH MINERALS) TABS Take 1 tablet by mouth at bedtime.    [provider]  nitroGLYCERIN (NITROSTAT) 0.4 MG SL tablet Place 1 tablet (0.4 mg total) under the tongue every 5 (five) minutes as needed for chest pain (max 3 tabs in 15 minutes). 09/13/19   Tonia Ghent, MD  Omega 3-6-9 Fatty Acids (OMEGA-3-6-9 PO) Take 2,400 mg by mouth 3 (three) times a week. liquid    [provider]  Omega-3 Fatty Acids (FISH OIL) 1200 MG CAPS Take 1,200 mg by mouth daily.    [provider]  rosuvastatin (CRESTOR) 10 MG tablet Take 1 tablet (10 mg total) by mouth daily. 11/13/21   Troy Sine, MD  Turmeric 500 MG CAPS Take 500 mg by mouth at bedtime.    [provider]    Physical Exam: Vitals:   11/16/21 1047 11/16/21 1253  BP: 122/67 117/72  Pulse: 74 (!) 37  Resp: 18 18  Temp: 98.2 F (36.8 C)   SpO2: 100% 93%  Weight: 87 kg   Height: 5\' 3"  (1.6 m)     Constitutional: NAD, calm, comfortable Vitals:   11/16/21 1047 11/16/21 1253   BP: 122/67 117/72  Pulse: 74 (!) 37  Resp: 18 18  Temp: 98.2 F (36.8 C)   SpO2: 100% 93%  Weight: 87 kg   Height: 5\' 3"  (1.6 m)    Eyes: PERRL, lids and conjunctivae normal ENMT: Mucous membranes are moist. Posterior pharynx clear of any exudate or lesions.Normal dentition.  Neck: normal, supple, no masses, no thyromegaly  Respiratory: clear to auscultation bilaterally, no wheezing, fine crackles to bilateral mid field, talking in broken sentences, increasing breathing effort.  No accessory muscle use.  Cardiovascular: Regular rate and rhythm, no murmurs / rubs / gallops. No extremity edema. 2+ pedal pulses. No carotid bruits.  Abdomen: no tenderness, no masses palpated. No hepatosplenomegaly. Bowel sounds positive.  Musculoskeletal: no clubbing / cyanosis. No joint deformity upper and lower extremities. Good ROM, no contractures. Normal muscle tone.  Skin: no rashes, lesions, ulcers. No induration Neurologic: CN 2-12 grossly intact. Sensation intact, DTR normal. Strength 5/5 in all 4.  Psychiatric: Normal judgment and insight. Alert and oriented x 3. Normal mood.    Labs on Admission: I have personally reviewed following labs and imaging studies  CBC: Recent Labs  Lab 11/10/21 1636 11/11/21 1831 11/16/21 1111  WBC 10.2 10.6* 9.9  NEUTROABS 7.1 7.9*  --   HGB 13.7 12.5 11.5*  HCT 42.0 36.9 36.0  MCV 91.5 88.9 92.3  PLT 310 296 518   Basic Metabolic Panel: Recent Labs  Lab 11/10/21 1636 11/10/21 1836 11/11/21 1831 11/16/21 1111  NA 134*  --  129* 139  K 4.0  --  3.7 4.5  CL 99  --  97* 103  CO2 24  --  24 29  GLUCOSE 249*  --  221* 153*  BUN 18  --  12 15  CREATININE 1.20* 1.13* 1.21* 1.47*  CALCIUM 9.1  --  8.2* 9.0   GFR: Estimated Creatinine Clearance: 25 mL/min (A) (by C-G formula based on SCr of 1.47 mg/dL (H)). Liver Function Tests: Recent Labs  Lab 11/10/21 1636 11/16/21 1111  AST 34 21  ALT 21 24  ALKPHOS 60 64  BILITOT 0.7 0.7  PROT 6.7 6.1*   ALBUMIN 3.7 3.3*   No results for input(s): LIPASE, AMYLASE in the last 168 hours. No results for input(s): AMMONIA in the last 168 hours. Coagulation Profile: Recent Labs  Lab 11/10/21 1636  INR 1.1   Cardiac Enzymes: No results for input(s): CKTOTAL, CKMB, CKMBINDEX, TROPONINI in the last 168 hours. BNP (last 3 results) No results for input(s): PROBNP in the last 8760 hours. HbA1C: No results for input(s): HGBA1C in the last 72 hours. CBG: No results for input(s): GLUCAP in the last 168 hours. Lipid Profile: No results for input(s): CHOL, HDL, LDLCALC, TRIG, CHOLHDL, LDLDIRECT in the last 72 hours. Thyroid Function Tests: No results for input(s): TSH, T4TOTAL, FREET4, T3FREE, THYROIDAB in the last 72 hours. Anemia Panel: No results for input(s): VITAMINB12, FOLATE, FERRITIN, TIBC, IRON, RETICCTPCT in the last 72 hours. Urine analysis:    Component Value Date/Time   COLORURINE YELLOW 07/04/2012 1210   APPEARANCEUR CLOUDY (A) 07/04/2012 1210   LABSPEC 1.012 07/04/2012 1210   PHURINE 6.0 07/04/2012 1210   GLUCOSEU NEGATIVE 07/04/2012 1210   HGBUR TRACE (A) 07/04/2012 1210   BILIRUBINUR negative 05/20/2021 1139   KETONESUR negative 05/20/2021 1139   KETONESUR NEGATIVE 07/04/2012 1210   PROTEINUR negative 05/20/2021 1139   PROTEINUR NEGATIVE 07/04/2012 1210   UROBILINOGEN 0.2 05/20/2021 1139   UROBILINOGEN 0.2 07/04/2012 1210   NITRITE Negative 05/20/2021 1139   NITRITE NEGATIVE 07/04/2012 1210   LEUKOCYTESUR Negative 05/20/2021 1139    Radiological Exams on Admission: DG Chest Port 1 View  Result Date: 11/16/2021 CLINICAL DATA:  Shortness of breath EXAM: PORTABLE CHEST 1 VIEW COMPARISON:  Portable exam 1108 hours compared 07/04/2012 FINDINGS: Enlargement of cardiac silhouette with pulmonary vascular congestion. Atherosclerotic calcification aorta. Interstitial infiltrates mid  to lower lungs favor pulmonary edema. No pleural effusion or pneumothorax. Bones  demineralized. IMPRESSION: Probable CHF. Aortic Atherosclerosis (ICD10-I70.0). Electronically Signed   By: Lavonia Dana M.D.   On: 11/16/2021 12:19    EKG: Independently reviewed.  Sinus bradycardia.  Assessment/Plan Principal Problem:   CHF (congestive heart failure) (HCC) Active Problems:   Acute CHF (congestive heart failure) ([place])  (please populate well all problems here in Problem List. (For example, if patient is on BP meds at home and you resume or decide to hold them, it is a problem that needs to be her. Same for CAD, COPD, HLD and so on)  Acute on chronic diastolic CHF decompensation -Patient was evaluated by cardiology in the ED and recommend medical management. -Patient has both symptoms and signs of fluid overload, 1 dose of IV Lasix 20 mg given in the ED.  Recheck volume status tomorrow including checks x-ray to decide further diuresis.  Monitor BMP.   Symptomatic bradycardia/Near syncope -Hold Coreg for now  Generalized weakness -Check CK level and TSH. -New statin and Zetiafor now. -PT evaluation  Elevated troponins with CAD and recent STEMI and RCA stenting -No significant chest pains -Trend troponins -Continue aspirin, Plavix and lisinopril.  CKD stage IIIb -Fluid overload -Use Lasix with caution.  IIDM with hyperglycemia -Sliding scale  DVT prophylaxis: Heparin subcu Code Status: Full code Family Communication: Son/caregiver at bedside Disposition Plan: Expect 1-2 midnight hospital stay, once symptoms improved likely can go home. Consults called: Cardiology Admission status: Telemetry obs   Lequita Halt MD [facility] Pager [phone]  11/16/2021, 1:13 PM

## 2021-11-16 NOTE — Progress Notes (Signed)
IC note  Patient is a 88 year old female with a history of a celebration myocardial infarction who had stenting of the right coronary artery recently.  She was discharged 5 days ago.  She returns feeling unwell.  She is unable to specify exactly why she feels this way.  She denies any significant chest pain.  EKGs reviewed by me demonstrate an evolving inferior myocardial infarction pattern with no recurrent ST elevations.  The patient is not having any chest pain at this time.  Her heart rates are variable.  I reviewed EKGs which demonstrate sinus bradycardia and PACs.   I would hold her beta-blocker.  No urgent coronary angiography is needed.  Given her advanced age I believe her residual coronary artery disease should be treated medically if possible.

## 2021-11-17 ENCOUNTER — Observation Stay (HOSPITAL_COMMUNITY): Payer: Medicare HMO

## 2021-11-17 DIAGNOSIS — R0600 Dyspnea, unspecified: Secondary | ICD-10-CM | POA: Diagnosis not present

## 2021-11-17 DIAGNOSIS — I5021 Acute systolic (congestive) heart failure: Secondary | ICD-10-CM

## 2021-11-17 DIAGNOSIS — I4430 Unspecified atrioventricular block: Secondary | ICD-10-CM

## 2021-11-17 DIAGNOSIS — I5033 Acute on chronic diastolic (congestive) heart failure: Secondary | ICD-10-CM

## 2021-11-17 DIAGNOSIS — J811 Chronic pulmonary edema: Secondary | ICD-10-CM | POA: Diagnosis not present

## 2021-11-17 LAB — BASIC METABOLIC PANEL
Anion gap: 10 (ref 5–15)
BUN: 16 mg/dL (ref 8–23)
CO2: 26 mmol/L (ref 22–32)
Calcium: 8.6 mg/dL — ABNORMAL LOW (ref 8.9–10.3)
Chloride: 103 mmol/L (ref 98–111)
Creatinine, Ser: 1.38 mg/dL — ABNORMAL HIGH (ref 0.44–1.00)
GFR, Estimated: 36 mL/min — ABNORMAL LOW (ref >60)
Glucose, Bld: 109 mg/dL — ABNORMAL HIGH (ref 70–99)
Potassium: 3.8 mmol/L (ref 3.5–5.1)
Sodium: 139 mmol/L (ref 135–145)

## 2021-11-17 LAB — GLUCOSE, CAPILLARY
Glucose-Capillary: 121 mg/dL — ABNORMAL HIGH (ref 70–99)
Glucose-Capillary: 116 mg/dL — ABNORMAL HIGH (ref 70–99)

## 2021-11-17 MED ORDER — FUROSEMIDE 20 MG PO TABS: 20.0000 mg | ORAL_TABLET | ORAL | 0 refills | Status: DC | PRN

## 2021-11-17 MED ORDER — DICLOFENAC SODIUM 1 % EX GEL: 4.0000 g | Freq: Four times a day (QID) | CUTANEOUS | 0 refills | Status: AC | PRN

## 2021-11-17 MED ORDER — FUROSEMIDE 10 MG/ML IJ SOLN
20.0000 mg | Freq: Once | INTRAMUSCULAR | Status: AC
  Administered 2021-11-17: 20 mg via INTRAVENOUS
  Filled 2021-11-17: qty 2

## 2021-11-17 MED ORDER — DICLOFENAC SODIUM 1 % EX GEL
4.0000 g | Freq: Four times a day (QID) | CUTANEOUS | Status: DC | PRN
  Administered 2021-11-17: 4 g via TOPICAL
  Filled 2021-11-17 (×2): qty 100

## 2021-11-17 NOTE — Evaluation (Signed)
Physical Therapy Evaluation Patient Details Name: Cathy Larsen MRN: [id number] DOB: [birth year] Today's Date: 11/17/2021  History of Present Illness  Pt is 88 yo female presenting with bradycardia, L sided chest discomfort, fatigue, SOB.  PMH includes: STEMI with PCI to RCA 11/10/21, HTN, CAD, DM II, HLD, and gout.  Clinical Impression  Pt admitted with above diagnosis. Pt with notable fatigue with self care activities including walking to the bathroom and cleaning up afterwards. Pt ambulated 100' with RW and min guard A. Is ok to go home from a mobility standpoint but recommending HHPT to help her regain her tolerance for activity. HR dropped to 56 bpm before getting up from bed, remained 80-low 100's during activity.  Pt currently with functional limitations due to the deficits listed below (see PT Problem List). Pt will benefit from skilled PT to increase their independence and safety with mobility to allow discharge to the venue listed below.          Recommendations for follow up therapy are one component of a multi-disciplinary discharge planning process, led by the attending physician.  Recommendations may be updated based on patient status, additional functional criteria and insurance authorization.  Follow Up Recommendations Home health PT    Assistance Recommended at Discharge Intermittent Supervision/Assistance  Patient can return home with the following  Assistance with cooking/housework;Assist for transportation;Help with stairs or ramp for entrance    Equipment Recommendations None recommended by PT  Recommendations for Other Services       Functional Status Assessment Patient has had a recent decline in their functional status and demonstrates the ability to make significant improvements in function in a reasonable and predictable amount of time.     Precautions / Restrictions Precautions Precautions: Fall Restrictions Weight Bearing Restrictions: No      Mobility   Bed Mobility Overal bed mobility: Independent                  Transfers Overall transfer level: Needs assistance Equipment used: Rolling walker (2 wheels) Transfers: Sit to/from Stand Sit to Stand: Supervision           General transfer comment: pt able to complete from bed, chair, and toilet without physical assist. Cues for safety and pt takes increased time. Pt has not been mobile past couple of days so anticipate improvement now that she's OOB    Ambulation/Gait Ambulation/Gait assistance: Min guard Gait Distance (Feet): 100 Feet Assistive device: Rolling walker (2 wheels) Gait Pattern/deviations: Step-through pattern;Decreased stride length;Trunk flexed Gait velocity: decreased Gait velocity interpretation: <1.8 ft/sec, indicate of risk for recurrent falls   General Gait Details: decreased pace and makes very wide turns instead of problem solving to turn the RW around herself, but she is most familiar with a cane, not a RW. Pt chose to turn around at 66' which is less than her activity tolerance at baseline  Stairs            Wheelchair Mobility    Modified Rankin (Stroke Patients Only)       Balance Overall balance assessment: Needs assistance Sitting-balance support: No upper extremity supported Sitting balance-Leahy Scale: Good     Standing balance support: No upper extremity supported Standing balance-Leahy Scale: Fair Standing balance comment: pt able to perform pericare in standing with supervision                             Pertinent Vitals/Pain Pain Assessment: No/denies pain  Home Living Family/patient expects to be discharged to:: Private residence Living Arrangements: Children Available Help at Discharge: Family;Available 24 hours/day Type of Home: House Home Access: Stairs to enter Entrance Stairs-Rails:  (column she holds) Technical brewer of Steps: 2   Home Layout: One level Home Equipment: Conservation officer, nature  (2 wheels);Cane - quad;Cane - single point;Grab bars - tub/shower;Shower seat Additional Comments: grandson lives with her, there 24/7    Prior Function Prior Level of Function : Independent/Modified Independent;Driving             Mobility Comments: uses cane mostly for out of the house, uses quad cane at night going to bathroom, drives ADLs Comments: pt independent with ADL's, grandson does IADL's     Hand Dominance   Dominant Hand: Right    Extremity/Trunk Assessment   Upper Extremity Assessment RUE Deficits / Details: pt with some baseline deficits in R hand, noted stiffness    Lower Extremity Assessment Lower Extremity Assessment: Generalized weakness    Cervical / Trunk Assessment Cervical / Trunk Assessment: Kyphotic  Communication   Communication: No difficulties  Cognition Arousal/Alertness: Awake/alert Behavior During Therapy: WFL for tasks assessed/performed Overall Cognitive Status: History of cognitive impairments - at baseline (STM deficits)                                 General Comments: pt repeats self numerous times throughout session but otherwise [facility]        General Comments General comments (skin integrity, edema, etc.): BP dropped to 56 bpm one time while supine in bed. Otherwise 80-low 100's when mobilizing. Pt with 2/4 DOE. Incontinent of urine with mobility. Daughter in law present on eval    Exercises     Assessment/Plan    PT Assessment Patient needs continued PT services  PT Problem List Decreased strength;Decreased activity tolerance;Decreased balance;Decreased mobility;Cardiopulmonary status limiting activity       PT Treatment Interventions DME instruction;Stair training;Gait training;Functional mobility training;Therapeutic exercise;Therapeutic activities;Balance training;Patient/family education    PT Goals (Current goals can be found in the Care Plan section)  Acute Rehab PT Goals Patient Stated Goal: return  home today PT Goal Formulation: With patient Time For Goal Achievement: 12/01/21 Potential to Achieve Goals: Good    Frequency Min 3X/week     Co-evaluation               AM-PAC PT "6 Clicks" Mobility  Outcome Measure Help needed turning from your back to your side while in a flat bed without using bedrails?: None Help needed moving from lying on your back to sitting on the side of a flat bed without using bedrails?: None Help needed moving to and from a bed to a chair (including a wheelchair)?: A Little Help needed standing up from a chair using your arms (e.g., wheelchair or bedside chair)?: A Little Help needed to walk in hospital room?: A Little   6 Click Score: 17    End of Session Equipment Utilized During Treatment: Gait belt Activity Tolerance: Patient tolerated treatment well Patient left: in chair;with call bell/phone within reach;with family/visitor present Nurse Communication: Mobility status PT Visit Diagnosis: Other abnormalities of gait and mobility (R26.89);Muscle weakness (generalized) (M62.81)    Time: 9924-2683 PT Time Calculation (min) (ACUTE ONLY): 48 min   Charges:   PT Evaluation $PT Eval Moderate Complexity: 1 Mod PT Treatments $Gait Training: 8-22 mins $Therapeutic Activity: 8-22 mins  Leighton Roach, [place]  Pager [phone] Office [place] 11/17/2021, 1:44 PM

## 2021-11-17 NOTE — Care Management CC44 (Signed)
Condition Code 44 Documentation Completed  Patient Details  Name: Cathy Larsen MRN: [id number] Date of Birth: 1926-10-23   Condition Code 44 given:  Yes Patient signature on Condition Code 44 notice:  Yes Documentation of 2 MD's agreement:  Yes Code 44 added to claim:  Yes    Carles Collet, RN 11/17/2021, 2:08 PM

## 2021-11-17 NOTE — Progress Notes (Signed)
Pt has DC order. AVS was explained to pt and daughter-in-law, explained that [facility] will contact the pt and family. Daughter-in-law will drive the pt home.

## 2021-11-17 NOTE — TOC Transition Note (Addendum)
Transition of Care [facility]) - CM/SW Discharge Note   Patient Details  Name: Cathy Larsen MRN: [id number] Date of Birth: 05-14-1926  Transition of Care [facility]) CM/SW Contact:  Carles Collet, RN Phone Number: 11/17/2021, 2:15 PM   Clinical Narrative:   [name] w patient and daughter in law at bedside.  Patient will return home at DC, she lives w her 86 year old grandson who she raised, and she reports he takes excellent care of her. She has needed DME at home, multiple canes and a RW. She is agreeable to [facility] PT.  Referral made to [facility] Means accepted for start of care Wednesday PAtient understands accepting agency will follow up with her by phone in the next 1-2 days    Final next level of care: [place] Barriers to Discharge: No Barriers Identified   Patient Goals and CMS Choice Patient states their goals for this hospitalization and ongoing recovery are:: to return home CMS Medicare.gov Compare Post Acute Care list provided to:: Patient Choice offered to / list presented to : Patient  Discharge Placement                       Discharge Plan and Services                          HH Arranged: PT          Social Determinants of Health (SDOH) Interventions     Readmission Risk Interventions No flowsheet data found.

## 2021-11-17 NOTE — Progress Notes (Signed)
PROGRESS NOTE   Cathy Larsen  ZOX:[id number]    DOB: 09-17-1926    DOA: 11/16/2021  PCP: Tonia Ghent, MD   I have briefly reviewed patients previous medical records in [facility].  Chief Complaint  Patient presents with   Chest Pain    Brief Narrative:  88 year old female, her grandson lives with her, ambulates with the help of a cane, medical history significant for recent hospitalization 11/10/2021 - 11/12/2021 for inferior STEMI s/p PCI and new RCA stenting, HTN, CKD stage IIIb, chronic diastolic CHF, HLD, presented with complaints of worsening dyspnea, lightheaded, dizzy, fatigue and blurred vision.  Admitted for symptomatic bradycardia due to 2: 1 AV block due to carvedilol, discontinued and decompensated CHF.  Cardiology/EP cardiology consulted.   Assessment & Plan:  Principal Problem:   CHF (congestive heart failure) (HCC) Active Problems:   Acute CHF (congestive heart failure) (HCC)   Symptomatic bradycardia and near syncope/2: 1 AV block: Due to recently started carvedilol 12.5 Mg twice daily.  Carvedilol discontinued.  TSH normal.  Cardiology/EP cardiology input appreciated.  Patient would like to avoid pacemaker implant if at all possible.  Telemetry review 1/14 shows fluctuations between sinus rhythm and bradycardia in the 40s-SR and possible 2: 1 AV block.  Mobilize.  PT evaluation.  Acute on chronic diastolic CHF: S/p IV Lasix 20 mg x 1 on 1/13.  -250 mL but this may be inaccurate.  Clinically still with some volume overload and chest x-ray consistent with mild pulmonary edema.  We will repeat IV Lasix 20 mg x 1 today and reassess in a.m.  Recent STEMI, s/p RCA stenting, CAD: No anginal symptoms.  Troponins appropriately trending down compared to very recent hospitalization for MI.  Continue aspirin, Plavix, rosuvastatin, Zetia and losartan.  Acute kidney injury complicating CKD stage IIIb: Baseline creatinine probably in the 1.1 range.  Recent discharge at 1.21.   Presented with creatinine of 1.47.  This is improved to 1.38.  Follow BMP closely.  Type II DM with hyperglycemia: Controlled on SSI.  Jardiance added by cardiology.  Hyponatremia: Possibly due to hypervolemic hyponatremia.  Resolved.  Follow BMP in AM.  Anemia: Unclear etiology.  Follow CBC in AM.  Peripheral neuropathy: Has been going on for more than a year but not on treatment.  Offered to initiate gabapentin here versus outpatient with her PCP which she prefers.  Voltaren topical as needed.  Body mass index is 33.12 kg/m./Obesity    DVT prophylaxis: heparin injection 5,000 Units Start: 11/16/21 1315     Code Status: Full Code Family Communication: Grandson at bedside Disposition:  Status is: Observation  The patient will require care spanning > 2 midnights and should be moved to inpatient because: Symptomatic bradycardia with high degree AV block, decompensated CHF, IV Lasix and monitoring on telemetry.       Consultants:   Cardiology/EP cardiology  Procedures:   None  Antimicrobials:   None   Subjective:  Grandson at bedside.  Reports feeling overall tired.  However denies dizziness, lightheadedness, chest pain or dyspnea.  Did not sleep last night due to burning pain in bilateral feet, ongoing intermittently for more than a year.  Did not like the hospital breakfast.  Objective:   Vitals:   11/17/21 0501 11/17/21 0807 11/17/21 0957 11/17/21 0958  BP:  (!) 160/70 (!) 150/73 (!) 168/68  Pulse:  (!) 41  72  Resp:  (!) 21  (!) 23  Temp:  98 F (36.7 C)  TempSrc:  Oral    SpO2:  95%  95%  Weight: 84.8 kg     Height:        General exam: Elderly female, moderately built and obese sitting up comfortably in bed without distress. Respiratory system: Few bibasilar crackles but otherwise clear to auscultation without wheezing or rhonchi.  No increased work of breathing.  Able to speak in full sentences. Cardiovascular system: S1 & S2 heard, RRR. No JVD, murmurs,  rubs, gallops or clicks. No pedal edema.  Telemetry personally reviewed: Fluctuates between sinus rhythm and bradycardia in the 40s-SB and?  2: 1 AV block. Gastrointestinal system: Abdomen is nondistended, soft and nontender. No organomegaly or masses felt. Normal bowel sounds heard. Central nervous system: Alert and oriented. No focal neurological deficits. Extremities: Symmetric 5 x 5 power.  Good bilateral peripheral pulses.  No abnormal color changes of feet. Skin: No rashes, lesions or ulcers Psychiatry: Judgement and insight appear normal. Mood & affect appropriate.     Data Reviewed:   I have personally reviewed following labs and imaging studies   CBC: Recent Labs  Lab 11/10/21 1636 11/11/21 1831 11/16/21 1111  WBC 10.2 10.6* 9.9  NEUTROABS 7.1 7.9*  --   HGB 13.7 12.5 11.5*  HCT 42.0 36.9 36.0  MCV 91.5 88.9 92.3  PLT 310 296 244    Basic Metabolic Panel: Recent Labs  Lab 11/10/21 1636 11/10/21 1836 11/11/21 1831 11/16/21 1111 11/17/21 0349  NA 134*  --  129* 139 139  K 4.0  --  3.7 4.5 3.8  CL 99  --  97* 103 103  CO2 24  --  24 29 26   GLUCOSE 249*  --  221* 153* 109*  BUN 18  --  12 15 16   CREATININE 1.20* 1.13* 1.21* 1.47* 1.38*  CALCIUM 9.1  --  8.2* 9.0 8.6*    Liver Function Tests: Recent Labs  Lab 11/10/21 1636 11/16/21 1111  AST 34 21  ALT 21 24  ALKPHOS 60 64  BILITOT 0.7 0.7  PROT 6.7 6.1*  ALBUMIN 3.7 3.3*    CBG: Recent Labs  Lab 11/16/21 1837 11/16/21 2057 11/17/21 0604  GLUCAP 195* 125* 121*    Microbiology Studies:   Recent Results (from the past 240 hour(s))  Resp Panel by RT-PCR (Flu A&B, Covid) Nasopharyngeal Swab     Status: None   Collection Time: 11/10/21  4:28 PM   Specimen: Nasopharyngeal Swab; Nasopharyngeal(NP) swabs in vial transport medium  Result Value Ref Range Status   SARS Coronavirus 2 by RT PCR NEGATIVE NEGATIVE Final    Comment: (NOTE) SARS-CoV-2 target nucleic acids are NOT DETECTED.  The  SARS-CoV-2 RNA is generally detectable in upper respiratory specimens during the acute phase of infection. The lowest concentration of SARS-CoV-2 viral copies this assay can detect is 138 copies/mL. A negative result does not preclude SARS-Cov-2 infection and should not be used as the sole basis for treatment or other patient management decisions. A negative result may occur with  improper specimen collection/handling, submission of specimen other than nasopharyngeal swab, presence of viral mutation(s) within the areas targeted by this assay, and inadequate number of viral copies(<138 copies/mL). A negative result must be combined with clinical observations, patient history, and epidemiological information. The expected result is Negative.  Fact Sheet for Patients:  EntrepreneurPulse.com.au  Fact Sheet for Healthcare Providers:  IncredibleEmployment.be  This test is no t yet approved or cleared by the [place] and  has been authorized for  detection and/or diagnosis of SARS-CoV-2 by FDA under an Emergency Use Authorization (EUA). This EUA will remain  in effect (meaning this test can be used) for the duration of the COVID-19 declaration under Section 564(b)(1) of the Act, 21 U.S.C.section 360bbb-3(b)(1), unless the authorization is terminated  or revoked sooner.       Influenza A by PCR NEGATIVE NEGATIVE Final   Influenza B by PCR NEGATIVE NEGATIVE Final    Comment: (NOTE) The Xpert Xpress SARS-CoV-2/FLU/RSV plus assay is intended as an aid in the diagnosis of influenza from Nasopharyngeal swab specimens and should not be used as a sole basis for treatment. Nasal washings and aspirates are unacceptable for Xpert Xpress SARS-CoV-2/FLU/RSV testing.  Fact Sheet for Patients: EntrepreneurPulse.com.au  Fact Sheet for Healthcare Providers: IncredibleEmployment.be  This test is not yet approved or  cleared by the [place] FDA and has been authorized for detection and/or diagnosis of SARS-CoV-2 by FDA under an Emergency Use Authorization (EUA). This EUA will remain in effect (meaning this test can be used) for the duration of the COVID-19 declaration under Section 564(b)(1) of the Act, 21 U.S.C. section 360bbb-3(b)(1), unless the authorization is terminated or revoked.  Performed at [facility], [place] [address] [place], [place] [address]   MRSA Next Gen by PCR, Nasal     Status: None   Collection Time: 11/11/21  5:40 AM   Specimen: Nasal Mucosa; Nasal Swab  Result Value Ref Range Status   MRSA by PCR Next Gen NOT DETECTED NOT DETECTED Final    Comment: (NOTE) The GeneXpert MRSA Assay (FDA approved for NASAL specimens only), is one component of a comprehensive MRSA colonization surveillance program. It is not intended to diagnose MRSA infection nor to guide or monitor treatment for MRSA infections. Test performance is not FDA approved in patients less than 22 years old. Performed at [facility], [place] [address] [place],  [address]   Resp Panel by RT-PCR (Flu A&B, Covid) Nasopharyngeal Swab     Status: None   Collection Time: 11/16/21 10:54 AM   Specimen: Nasopharyngeal Swab; Nasopharyngeal(NP) swabs in vial transport medium  Result Value Ref Range Status   SARS Coronavirus 2 by RT PCR NEGATIVE NEGATIVE Final    Comment: (NOTE) SARS-CoV-2 target nucleic acids are NOT DETECTED.  The SARS-CoV-2 RNA is generally detectable in upper respiratory specimens during the acute phase of infection. The lowest concentration of SARS-CoV-2 viral copies this assay can detect is 138 copies/mL. A negative result does not preclude SARS-Cov-2 infection and should not be used as the sole basis for treatment or other patient management decisions. A negative result may occur with  improper specimen collection/handling, submission of specimen other than nasopharyngeal  swab, presence of viral mutation(s) within the areas targeted by this assay, and inadequate number of viral copies(<138 copies/mL). A negative result must be combined with clinical observations, patient history, and epidemiological information. The expected result is Negative.  Fact Sheet for Patients:  EntrepreneurPulse.com.au  Fact Sheet for Healthcare Providers:  IncredibleEmployment.be  This test is no t yet approved or cleared by the [place] FDA and  has been authorized for detection and/or diagnosis of SARS-CoV-2 by FDA under an Emergency Use Authorization (EUA). This EUA will remain  in effect (meaning this test can be used) for the duration of the COVID-19 declaration under Section 564(b)(1) of the Act, 21 U.S.C.section 360bbb-3(b)(1), unless the authorization is terminated  or revoked sooner.       Influenza A  by PCR NEGATIVE NEGATIVE Final   Influenza B by PCR NEGATIVE NEGATIVE Final    Comment: (NOTE) The Xpert Xpress SARS-CoV-2/FLU/RSV plus assay is intended as an aid in the diagnosis of influenza from Nasopharyngeal swab specimens and should not be used as a sole basis for treatment. Nasal washings and aspirates are unacceptable for Xpert Xpress SARS-CoV-2/FLU/RSV testing.  Fact Sheet for Patients: EntrepreneurPulse.com.au  Fact Sheet for Healthcare Providers: IncredibleEmployment.be  This test is not yet approved or cleared by the [place] FDA and has been authorized for detection and/or diagnosis of SARS-CoV-2 by FDA under an Emergency Use Authorization (EUA). This EUA will remain in effect (meaning this test can be used) for the duration of the COVID-19 declaration under Section 564(b)(1) of the Act, 21 U.S.C. section 360bbb-3(b)(1), unless the authorization is terminated or revoked.  Performed at [facility], [place] [address], [place], [place] [address]      Radiology Studies:  DG Chest Port 1 View  Result Date: 11/17/2021 CLINICAL DATA:  Dyspnea EXAM: PORTABLE CHEST 1 VIEW COMPARISON:  Chest radiograph from one day prior. FINDINGS: Stable cardiomediastinal silhouette with top-normal heart size. No pneumothorax. No pleural effusion. Borderline mild pulmonary edema, improved. Similar mild reticular opacities at the lung bases. IMPRESSION: 1. Borderline mild pulmonary edema, improved. 2. Nonspecific mild scarring at the lung bases. Electronically Signed   By: Ilona Sorrel M.D.   On: 11/17/2021 08:17   DG Chest Port 1 View  Result Date: 11/16/2021 CLINICAL DATA:  Shortness of breath EXAM: PORTABLE CHEST 1 VIEW COMPARISON:  Portable exam 1108 hours compared 07/04/2012 FINDINGS: Enlargement of cardiac silhouette with pulmonary vascular congestion. Atherosclerotic calcification aorta. Interstitial infiltrates mid to lower lungs favor pulmonary edema. No pleural effusion or pneumothorax. Bones demineralized. IMPRESSION: Probable CHF. Aortic Atherosclerosis (ICD10-I70.0). Electronically Signed   By: Lavonia Dana M.D.   On: 11/16/2021 12:19    Scheduled Meds:    aspirin  324 mg Oral Once   aspirin EC  81 mg Oral Daily   clopidogrel  75 mg Oral Daily   empagliflozin  10 mg Oral Daily   ezetimibe  10 mg Oral Daily   heparin  5,000 Units Subcutaneous Q12H   insulin aspart  0-9 Units Subcutaneous TID WC   losartan  50 mg Oral Daily   magnesium oxide  200 mg Oral QHS   rosuvastatin  10 mg Oral Daily   sodium chloride flush  3 mL Intravenous Q12H    Continuous Infusions:    sodium chloride 10 mL/hr at 11/17/21 0422   sodium chloride       LOS: 0 days     Vernell Leep, MD,  FACP, [facility], [facility], [facility] (Care Management Physician Certified) [place]  To contact the attending provider between 7A-7P or the covering provider during after hours 7P-7A, please log into the web site [email or web address] and access using  universal  [place] password for that web site. If you do not have the password, please call the hospital operator.  11/17/2021, 11:12 AM

## 2021-11-17 NOTE — Progress Notes (Signed)
°  Transition of Care [facility]) Screening Note   Patient Details  Name: Cathy Larsen Date of Birth: 1926-10-21   Transition of Care [facility]) CM/SW Contact:    Benard Halsted, LCSW Phone Number: 11/17/2021, 8:44 AM    Transition of Care Department [facility]) has reviewed patient. We will continue to monitor patient advancement through interdisciplinary progression rounds. If new patient transition needs arise, please place a TOC consult.

## 2021-11-17 NOTE — Progress Notes (Signed)
Patient complaint pain under her toes (bilateral). Grandson at the bedside stated that it's been on and off problem, they consulted podiatrist but no intervention or diagnosis. Pt stated sometimes her feet feels cold thinks warm can be helpful. Upon assessment, toes appeared to be red. Warm packs applied, it relief pain for about 5 minutes then complaint of pain again remove the warm packs and offer tylenol.

## 2021-11-17 NOTE — Progress Notes (Addendum)
Progress Note  Patient Name: Cathy Larsen Date of Encounter: 11/17/2021  [facility] Cardiologist: Mertie Moores, MD   Subjective   She feels reasonably well today.  She has not had chest pain or shortness of breath overnight.  She does not have palpitations, has not been lightheaded or dizzy.  She does not want a pacemaker.  Her grandson lives with her and helps with her medications.  Inpatient Medications    Scheduled Meds:  aspirin  324 mg Oral Once   aspirin EC  81 mg Oral Daily   clopidogrel  75 mg Oral Daily   empagliflozin  10 mg Oral Daily   ezetimibe  10 mg Oral Daily   heparin  5,000 Units Subcutaneous Q12H   insulin aspart  0-9 Units Subcutaneous TID WC   losartan  50 mg Oral Daily   magnesium oxide  200 mg Oral QHS   rosuvastatin  10 mg Oral Daily   sodium chloride flush  3 mL Intravenous Q12H   Continuous Infusions:  sodium chloride 10 mL/hr at 11/17/21 0422   sodium chloride     PRN Meds: sodium chloride, acetaminophen, hydrALAZINE, ondansetron (ZOFRAN) IV, polyvinyl alcohol, sodium chloride flush   Vital Signs    Vitals:   11/17/21 0018 11/17/21 0455 11/17/21 0501 11/17/21 0807  BP: 114/61 (!) 132/48  (!) 160/70  Pulse: (!) 40 (!) 38  (!) 41  Resp: 17 20  (!) 21  Temp: 97.7 F (36.5 C) 97.8 F (36.6 C)  98 F (36.7 C)  TempSrc: Oral Oral  Oral  SpO2: 95% 95%  95%  Weight:   84.8 kg   Height:        Intake/Output Summary (Last 24 hours) at 11/17/2021 0841 Last data filed at 11/17/2021 0820 Gross per 24 hour  Intake 500.01 ml  Output 750 ml  Net -249.99 ml   Last 3 Weights 11/17/2021 11/16/2021 11/10/2021  Weight (lbs) 186 lb 15.2 oz 191 lb 12.8 oz 192 lb 10.9 oz  Weight (kg) 84.8 kg 87 kg 87.4 kg      Telemetry    Sinus rhythm but mostly sinus bradycardia with a first-degree AV block and some PACs.- Personally Reviewed  ECG    None today- Personally Reviewed  Physical Exam   GEN: No acute distress.   Neck: No JVD Cardiac:  RRR, no murmurs, rubs, or gallops.  Respiratory: Clear to auscultation bilaterally. GI: Soft, nontender, non-distended  MS: No edema; No deformity. Neuro:  Nonfocal  Psych: Normal affect   Labs    High Sensitivity Troponin:   Recent Labs  Lab 11/10/21 1636 11/10/21 1836 11/16/21 1111 11/16/21 1320  TROPONINIHS 171* >24,000* 967* 871*     Chemistry Recent Labs  Lab 11/10/21 1636 11/10/21 1836 11/11/21 1831 11/16/21 1111 11/17/21 0349  NA 134*  --  129* 139 139  K 4.0  --  3.7 4.5 3.8  CL 99  --  97* 103 103  CO2 24  --  24 29 26   GLUCOSE 249*  --  221* 153* 109*  BUN 18  --  12 15 16   CREATININE 1.20*   < > 1.21* 1.47* 1.38*  CALCIUM 9.1  --  8.2* 9.0 8.6*  PROT 6.7  --   --  6.1*  --   ALBUMIN 3.7  --   --  3.3*  --   AST 34  --   --  21  --   ALT 21  --   --  24  --   ALKPHOS 60  --   --  64  --   BILITOT 0.7  --   --  0.7  --   GFRNONAA 42*   < > 42* 33* 36*  ANIONGAP 11  --  8 7 10    < > = values in this interval not displayed.    Lipids  Recent Labs  Lab 11/11/21 1831  CHOL 203*  TRIG 175*  HDL 32*  LDLCALC 136*  CHOLHDL 6.3    Hematology Recent Labs  Lab 11/10/21 1636 11/11/21 1831 11/16/21 1111  WBC 10.2 10.6* 9.9  RBC 4.59 4.15 3.90  HGB 13.7 12.5 11.5*  HCT 42.0 36.9 36.0  MCV 91.5 88.9 92.3  MCH 29.8 30.1 29.5  MCHC 32.6 33.9 31.9  RDW 12.7 12.9 13.2  PLT 310 296 326   Thyroid  Recent Labs  Lab 11/16/21 1851  TSH 3.031    BNP Recent Labs  Lab 11/16/21 1248  BNP 1,161.8*    DDimer No results for input(s): DDIMER in the last 168 hours.   Radiology    DG Chest Port 1 View  Result Date: 11/17/2021 CLINICAL DATA:  Dyspnea EXAM: PORTABLE CHEST 1 VIEW COMPARISON:  Chest radiograph from one day prior. FINDINGS: Stable cardiomediastinal silhouette with top-normal heart size. No pneumothorax. No pleural effusion. Borderline mild pulmonary edema, improved. Similar mild reticular opacities at the lung bases. IMPRESSION: 1.  Borderline mild pulmonary edema, improved. 2. Nonspecific mild scarring at the lung bases. Electronically Signed   By: Ilona Sorrel M.D.   On: 11/17/2021 08:17   DG Chest Port 1 View  Result Date: 11/16/2021 CLINICAL DATA:  Shortness of breath EXAM: PORTABLE CHEST 1 VIEW COMPARISON:  Portable exam 1108 hours compared 07/04/2012 FINDINGS: Enlargement of cardiac silhouette with pulmonary vascular congestion. Atherosclerotic calcification aorta. Interstitial infiltrates mid to lower lungs favor pulmonary edema. No pleural effusion or pneumothorax. Bones demineralized. IMPRESSION: Probable CHF. Aortic Atherosclerosis (ICD10-I70.0). Electronically Signed   By: Lavonia Dana M.D.   On: 11/16/2021 12:19    Cardiac Studies   Cath: 11/10/2021     Ost RCA to Prox RCA lesion is 30% stenosed.   Prox RCA lesion is 100% stenosed.   Dist RCA lesion is 50% stenosed.   Prox Cx lesion is 90% stenosed.   Mid Cx lesion is 85% stenosed.   Dist Cx lesion is 100% stenosed.   Ost LAD to Prox LAD lesion is 60% stenosed.   Mid LAD-1 lesion is 80% stenosed.   Mid LAD-2 lesion is 50% stenosed.   A drug-eluting stent was successfully placed.   Post intervention, there is a 0% residual stenosis.   There is mild left ventricular systolic dysfunction.   LV end diastolic pressure is mildly elevated.   The left ventricular ejection fraction is 50-55% by visual estimate.   Acute STEMI secondary to total proximal to mid occlusion of a large dominant RCA.   Significant multivessel CAD with evidence for coronary calcification in the left coronary system with 60% proximal LAD stenosis followed by focal 80% stenosis with 50% mid stenosis; severe left circumflex stenoses of 90% proximally followed by focal 85% and then total occlusion of the distal circumflex after a moderate sized marginal vessel and total occlusion of the proximal to mid RCA.   Mild LV dysfunction with EF estimated approximately 50% with mid to basal inferior  hypocontractility.  LVEDP 21 mmHg.   Successful PCI to the totally occluded RCA with ultimate insertion  of a 3.5 x 22 mm Medtronic Onyx Frontier stent postdilated to 3.55 mm with the 100% occlusion being reduced to 0%.  There is evidence for 50% stenosis in the distal RCA proximal to the PDA takeoff.   RECOMMENDATION: DAPT for minimum of 1 year.  The patient has significant disease involving her left coronary system.  Will review with colleagues regarding medical therapy versus attempt at circumflex intervention.  The patient reportedly has an allergy to statin.  Will initiate Zetia.  However may consider rechallenge low-dose statin if myalgias were only an issue.  Optimal blood pressure control.   Diagnostic Dominance: Right Intervention     Echo: 11/11/2021   IMPRESSIONS     1. Left ventricular ejection fraction, by estimation, is 60 to 65%. The  left ventricle has normal function. The left ventricle demonstrates  regional wall motion abnormalities (see scoring diagram/findings for  description). There is mild concentric left  ventricular hypertrophy. Indeterminate diastolic filling due to E-A  fusion. There is mild hypokinesis of the left ventricular, basal inferior  wall.   2. Right ventricular systolic function is normal. The right ventricular  size is normal. There is moderately elevated pulmonary artery systolic  pressure. The estimated right ventricular systolic pressure is 31.5 mmHg.   3. Left atrial size was moderately dilated.   4. Right atrial size was mild to moderately dilated.   5. The mitral valve is grossly normal. Moderate mitral valve  regurgitation.   6. The aortic valve is grossly normal. There is mild calcification of the  aortic valve. Aortic valve regurgitation is trivial. No aortic stenosis is  present.   7. Aortic dilatation noted. There is borderline dilatation of the  ascending aorta, measuring 38 mm.   8. The inferior vena cava is dilated in size with  <50% respiratory  variability, suggesting right atrial pressure of 15 mmHg.   Comparison(s): Changes from prior study are noted.   Conclusion(s)/Recommendation(s): Mild hypokinesis of basal inferior wall  with overall preserved LVEF. Moderate MR.   FINDINGS   Left Ventricle: Left ventricular ejection fraction, by estimation, is 60  to 65%. The left ventricle has normal function. The left ventricle  demonstrates regional wall motion abnormalities. Mild hypokinesis of the  left ventricular, basal inferior wall.  The left ventricular internal cavity size was normal in size. There is  mild concentric left ventricular hypertrophy. Indeterminate diastolic  filling due to E-A fusion.   Right Ventricle: The right ventricular size is normal. No increase in  right ventricular wall thickness. Right ventricular systolic function is  normal. There is moderately elevated pulmonary artery systolic pressure.  The tricuspid regurgitant velocity is  2.85 m/s, and with an assumed right atrial pressure of 15 mmHg, the  estimated right ventricular systolic pressure is 17.6 mmHg.   Left Atrium: Left atrial size was moderately dilated.   Right Atrium: Right atrial size was mild to moderately dilated.   Pericardium: There is no evidence of pericardial effusion. Presence of  epicardial fat layer.   Mitral Valve: The mitral valve is grossly normal. Mild to moderate mitral  annular calcification. Moderate mitral valve regurgitation.   Tricuspid Valve: The tricuspid valve is normal in structure. Tricuspid  valve regurgitation is mild . No evidence of tricuspid stenosis.   Aortic Valve: The aortic valve is grossly normal. There is mild  calcification of the aortic valve. Aortic valve regurgitation is trivial.  No aortic stenosis is present. Aortic valve mean gradient measures 2.0  mmHg. Aortic valve  peak gradient measures 4.0   mmHg. Aortic valve area, by VTI measures 2.29 cm.   Pulmonic Valve: The  pulmonic valve was not well visualized. Pulmonic valve  regurgitation is trivial. No evidence of pulmonic stenosis.   Aorta: Aortic dilatation noted. There is borderline dilatation of the  ascending aorta, measuring 38 mm.   Venous: The inferior vena cava is dilated in size with less than 50%  respiratory variability, suggesting right atrial pressure of 15 mmHg.   IAS/Shunts: The atrial septum is grossly normal.   Patient Profile     88 y.o. female  with a hx of HTN, HLD, DM, with recent STEMI s/p PCI to RCA who was admitted 01/13 with chest pain and bradycardia, Cards and EP saw.  Assessment & Plan    Chest pain - multi-vessel dz, s/p DES RCA 01/08, o/w med rx - trop trending down -I explained to the patient and her grandson that if she has anginal symptoms, we can treat this with medications,  Medical therapy does not mean doing nothing.  2. Bradycardia - 2:1 block felt 2nd Coreg >> d/c'd - pt prefers to avoid PPM -She is asymptomatic even with the bradycardia - Follow for symptoms, but currently no additional treatment -Continue to hold beta-blocker  Otherwise, per IM   For questions or updates, please contact [place] Please consult [email or web address] for contact info under        Signed, Rosaria Ferries, PA-C  11/17/2021, 8:41 AM    History and all data above reviewed.  Patient examined.  I agree with the findings as above.  She ambulated and her heart rate was fine. No symptomatic bradycardia.  No chest pain or SOB. The patient exam reveals COR:RRR  ,  Lungs: Clear  ,  Abd: Positive bowel sounds, no rebound no guarding, Ext No edema, bruising  .  All available labs, radiology testing, previous records reviewed. Agree with documented assessment and plan.   Chest pain:  As above.  No change in therapy.  Avoiding beta blockers.   Bradycardia:  Discussed with her family and they will look out for symptoms.  No indication for pacing at this point.   OK to go home from our  standpoint.   Jeneen Rinks Maria Coin  1:07 PM  11/17/2021

## 2021-11-17 NOTE — Care Management Obs Status (Signed)
[facility] NOTIFICATION   Patient Details  Name: Cathy Larsen MRN: [id number] Date of Birth: 17-Jan-1926   Medicare Observation Status Notification Given:  Yes    Carles Collet, RN 11/17/2021, 2:08 PM

## 2021-11-17 NOTE — Discharge Summary (Signed)
Physician Discharge Summary  SHANDIE BERTZ UYQ:[id number] DOB: 1926-05-24  PCP: Tonia Ghent, MD  Admitted from: Home Discharged to: Home  Admit date: 11/16/2021 Discharge date: 11/17/2021  Recommendations for Outpatient Follow-up:    Follow-up Information     Tonia Ghent, MD. Schedule an appointment as soon as possible for a visit in 1 week(s).   Specialty: Family Medicine Why: To be seen with repeat labs (CBC & BMP). Contact information: [place] [address] [phone]         Nahser, Wonda Cheng, MD .   Specialty: Cardiology Contact information: [place] [address] [place] [address] [phone]                  Home Health: PT    Equipment/Devices: None    Discharge Condition: Improved and stable   Code Status: Full Code Diet recommendation:  Discharge Diet Orders (From admission, onward)     Start     Ordered   11/17/21 0000  Diet - low sodium heart healthy        11/17/21 1337   11/17/21 0000  Diet Carb Modified        11/17/21 1337             Discharge Diagnoses:  Principal Problem:   CHF (congestive heart failure) ([place]) Active Problems:   Acute CHF (congestive heart failure) ([place])   Acute on chronic diastolic CHF (congestive heart failure) ([place])   Brief Summary: 88 year old female, her grandson lives with her, ambulates with the help of a cane, medical history significant for recent hospitalization 11/10/2021 - 11/12/2021 for inferior STEMI s/p PCI and new RCA stenting, HTN, CKD stage IIIb, chronic diastolic CHF, HLD, presented with complaints of worsening dyspnea, lightheaded, dizzy, fatigue and blurred vision.  Admitted for symptomatic bradycardia due to 2: 1 AV block due to carvedilol, discontinued and decompensated CHF.  Cardiology/EP cardiology consulted.     Assessment & Plan:   Symptomatic bradycardia and near syncope/2: 1 AV block: Due to recently started carvedilol 12.5 Mg twice  daily.  Carvedilol discontinued.  TSH normal.  Cardiology/EP cardiology input appreciated.  Patient would like to avoid pacemaker implant if at all possible.  Telemetry review 1/14 shows fluctuations between sinus rhythm and bradycardia in the 40s-SR and possible 2: 1 AV block.  Mobilize.  PT evaluation. Subsequently seen by Dr. Percival Spanish.  She ambulated and her heart rate was fine without symptomatic bradycardia.  He has cleared her for discharge home.  I discussed with him.  Acute on chronic diastolic CHF: S/p IV Lasix 20 mg x 1 on 1/13 and 1/14.  As discussed with Dr. Percival Spanish, given her very advanced age, minimal volume overload status earlier, recommends as needed Lasix as noted below.  He has cleared her for discharge home and patient already has a prior outpatient cardiology follow-up appointment on 1/23.  Recent STEMI, s/p RCA stenting, CAD: No anginal symptoms.  Troponins appropriately trending down compared to very recent hospitalization for MI.  Continue aspirin, Plavix, rosuvastatin, Zetia and losartan.  Acute kidney injury complicating CKD stage IIIb: Baseline creatinine probably in the 1.1 range.  Recent discharge at 1.21.  Presented with creatinine of 1.47.  This is improved to 1.38.  Follow BMP closely as outpatient.  Type II DM with hyperglycemia: Jardiance added by cardiology.  Hyponatremia: Possibly due to hypervolemic hyponatremia.  Resolved.    Anemia: Unclear etiology.  Follow CBC as outpatient.  Peripheral neuropathy:  Has been going on for more than a year but not on treatment.  Offered to initiate gabapentin here versus outpatient with her PCP which she prefers.  Voltaren topical as needed.   Body mass index is 33.12 kg/m./Obesity      Consultants:   Cardiology/EP cardiology   Procedures:   None   Discharge Instructions  Discharge Instructions     (HEART FAILURE PATIENTS) Call MD:  Anytime you have any of the following symptoms: 1) 3 pound weight gain in 24  hours or 5 pounds in 1 week 2) shortness of breath, with or without a dry hacking cough 3) swelling in the hands, feet or stomach 4) if you have to sleep on extra pillows at night in order to breathe.   Complete by: As directed    Call MD for:  difficulty breathing, headache or visual disturbances   Complete by: As directed    Call MD for:  extreme fatigue   Complete by: As directed    Call MD for:  persistant dizziness or light-headedness   Complete by: As directed    Call MD for:  severe uncontrolled pain   Complete by: As directed    Diet - low sodium heart healthy   Complete by: As directed    Diet Carb Modified   Complete by: As directed    Increase activity slowly   Complete by: As directed         Medication List     STOP taking these medications    carvedilol 12.5 MG tablet Commonly known as: COREG       TAKE these medications    Aspirin Low Dose 81 MG EC tablet Generic drug: aspirin Take 1 tablet (81 mg total) by mouth daily. Swallow whole.   clopidogrel 75 MG tablet Commonly known as: PLAVIX Take 1 tablet (75 mg total) by mouth daily.   diclofenac Sodium 1 % Gel Commonly known as: VOLTAREN Apply 4 g topically 4 (four) times daily as needed (feet pain).   ezetimibe 10 MG tablet Commonly known as: ZETIA Take 1 tablet (10 mg total) by mouth daily.   famotidine 10 MG tablet Commonly known as: PEPCID Take 10 mg by mouth daily as needed for heartburn or indigestion.   Fish Oil 1200 MG Caps Take 1,200 mg by mouth daily.   furosemide 20 MG tablet Commonly known as: Lasix Take 1 tablet (20 mg total) by mouth as needed (For 3 pound weight gain in 24 hours or 5 pound weight gain in 1 week.).   Ginger Root 500 MG Caps Take 500 mg by mouth at bedtime.   glucose blood test strip Commonly known as: ONE TOUCH ULTRA TEST USE DAILY TO CHECK SUGAR.  Dx 11.9.   Jardiance 10 MG Tabs tablet Generic drug: empagliflozin Take 1 tablet (10 mg total) by mouth  daily.   losartan 50 MG tablet Commonly known as: COZAAR Take 1 tablet (50 mg total) by mouth daily.   Magnesium 200 MG Tabs Take 200 mg by mouth at bedtime.   multivitamin with minerals Tabs tablet Take 1 tablet by mouth at bedtime.   nitroGLYCERIN 0.4 MG SL tablet Commonly known as: NITROSTAT Place 1 tablet (0.4 mg total) under the tongue every 5 (five) minutes as needed for chest pain (max 3 tabs in 15 minutes).   OMEGA-3-6-9 PO Take 2,400 mg by mouth 3 (three) times a week. liquid   rosuvastatin 10 MG tablet Commonly known as: CRESTOR Take 1 tablet (  10 mg total) by mouth daily.   Theratears 0.25 % Soln Generic drug: Carboxymethylcellulose Sodium Place 1 drop into both eyes at bedtime.   Turmeric 500 MG Caps Take 500 mg by mouth at bedtime.       Allergies  Allergen Reactions   Statins Other (See Comments)    Myalgias.    Keflex [Cephalexin] Other (See Comments)    Migraine   Requip [Ropinirole Hcl] Other (See Comments)    headache      Procedures/Studies: DG Chest Port 1 View  Result Date: 11/17/2021 CLINICAL DATA:  Dyspnea EXAM: PORTABLE CHEST 1 VIEW COMPARISON:  Chest radiograph from one day prior. FINDINGS: Stable cardiomediastinal silhouette with top-normal heart size. No pneumothorax. No pleural effusion. Borderline mild pulmonary edema, improved. Similar mild reticular opacities at the lung bases. IMPRESSION: 1. Borderline mild pulmonary edema, improved. 2. Nonspecific mild scarring at the lung bases. Electronically Signed   By: Ilona Sorrel M.D.   On: 11/17/2021 08:17   DG Chest Port 1 View  Result Date: 11/16/2021 CLINICAL DATA:  Shortness of breath EXAM: PORTABLE CHEST 1 VIEW COMPARISON:  Portable exam 1108 hours compared 07/04/2012 FINDINGS: Enlargement of cardiac silhouette with pulmonary vascular congestion. Atherosclerotic calcification aorta. Interstitial infiltrates mid to lower lungs favor pulmonary edema. No pleural effusion or pneumothorax.  Bones demineralized. IMPRESSION: Probable CHF. Aortic Atherosclerosis (ICD10-I70.0). Electronically Signed   By: Lavonia Dana M.D.   On: 11/16/2021 12:19   Subjective: Grandson at bedside.  Reports feeling overall tired.  However denies dizziness, lightheadedness, chest pain or dyspnea.  Did not sleep last night due to burning pain in bilateral feet, ongoing intermittently for more than a year.  Did not like the hospital breakfast.  Discharge Exam:  Vitals:   11/17/21 0957 11/17/21 0958 11/17/21 1128 11/17/21 1228  BP: (!) 150/73 (!) 168/68 (!) 156/67 140/76  Pulse:  72 75 94  Resp:  (!) 23 (!) 24 20  Temp:   98 F (36.7 C)   TempSrc:   Oral   SpO2:  95% 95% 92%  Weight:      Height:       General exam: Elderly female, moderately built and obese sitting up comfortably in bed without distress. Respiratory system: Few bibasilar crackles but otherwise clear to auscultation without wheezing or rhonchi.  No increased work of breathing.  Able to speak in full sentences. Cardiovascular system: S1 & S2 heard, RRR. No JVD, murmurs, rubs, gallops or clicks. No pedal edema.  Telemetry personally reviewed: Fluctuates between sinus rhythm and bradycardia in the 40s-SB and?  2: 1 AV block. Gastrointestinal system: Abdomen is nondistended, soft and nontender. No organomegaly or masses felt. Normal bowel sounds heard. Central nervous system: Alert and oriented. No focal neurological deficits. Extremities: Symmetric 5 x 5 power.  Good bilateral peripheral pulses.  No abnormal color changes of feet. Skin: No rashes, lesions or ulcers Psychiatry: Judgement and insight appear normal. Mood & affect appropriate.     The results of significant diagnostics from this hospitalization (including imaging, microbiology, ancillary and laboratory) are listed below for reference.     Microbiology: Recent Results (from the past 240 hour(s))  Resp Panel by RT-PCR (Flu A&B, Covid) Nasopharyngeal Swab     Status: None    Collection Time: 11/10/21  4:28 PM   Specimen: Nasopharyngeal Swab; Nasopharyngeal(NP) swabs in vial transport medium  Result Value Ref Range Status   SARS Coronavirus 2 by RT PCR NEGATIVE NEGATIVE Final    Comment: (NOTE) SARS-CoV-2  target nucleic acids are NOT DETECTED.  The SARS-CoV-2 RNA is generally detectable in upper respiratory specimens during the acute phase of infection. The lowest concentration of SARS-CoV-2 viral copies this assay can detect is 138 copies/mL. A negative result does not preclude SARS-Cov-2 infection and should not be used as the sole basis for treatment or other patient management decisions. A negative result may occur with  improper specimen collection/handling, submission of specimen other than nasopharyngeal swab, presence of viral mutation(s) within the areas targeted by this assay, and inadequate number of viral copies(<138 copies/mL). A negative result must be combined with clinical observations, patient history, and epidemiological information. The expected result is Negative.  Fact Sheet for Patients:  EntrepreneurPulse.com.au  Fact Sheet for Healthcare Providers:  IncredibleEmployment.be  This test is no t yet approved or cleared by the [place] FDA and  has been authorized for detection and/or diagnosis of SARS-CoV-2 by FDA under an Emergency Use Authorization (EUA). This EUA will remain  in effect (meaning this test can be used) for the duration of the COVID-19 declaration under Section 564(b)(1) of the Act, 21 U.S.C.section 360bbb-3(b)(1), unless the authorization is terminated  or revoked sooner.       Influenza A by PCR NEGATIVE NEGATIVE Final   Influenza B by PCR NEGATIVE NEGATIVE Final    Comment: (NOTE) The Xpert Xpress SARS-CoV-2/FLU/RSV plus assay is intended as an aid in the diagnosis of influenza from Nasopharyngeal swab specimens and should not be used as a sole basis for treatment.  Nasal washings and aspirates are unacceptable for Xpert Xpress SARS-CoV-2/FLU/RSV testing.  Fact Sheet for Patients: EntrepreneurPulse.com.au  Fact Sheet for Healthcare Providers: IncredibleEmployment.be  This test is not yet approved or cleared by the [place] FDA and has been authorized for detection and/or diagnosis of SARS-CoV-2 by FDA under an Emergency Use Authorization (EUA). This EUA will remain in effect (meaning this test can be used) for the duration of the COVID-19 declaration under Section 564(b)(1) of the Act, 21 U.S.C. section 360bbb-3(b)(1), unless the authorization is terminated or revoked.  Performed at [facility], [place] [address], [place], [place] [address]   MRSA Next Gen by PCR, Nasal     Status: None   Collection Time: 11/11/21  5:40 AM   Specimen: Nasal Mucosa; Nasal Swab  Result Value Ref Range Status   MRSA by PCR Next Gen NOT DETECTED NOT DETECTED Final    Comment: (NOTE) The GeneXpert MRSA Assay (FDA approved for NASAL specimens only), is one component of a comprehensive MRSA colonization surveillance program. It is not intended to diagnose MRSA infection nor to guide or monitor treatment for MRSA infections. Test performance is not FDA approved in patients less than 1 years old. Performed at [facility], [place] [address] [place], [place] [address]   Resp Panel by RT-PCR (Flu A&B, Covid) Nasopharyngeal Swab     Status: None   Collection Time: 11/16/21 10:54 AM   Specimen: Nasopharyngeal Swab; Nasopharyngeal(NP) swabs in vial transport medium  Result Value Ref Range Status   SARS Coronavirus 2 by RT PCR NEGATIVE NEGATIVE Final    Comment: (NOTE) SARS-CoV-2 target nucleic acids are NOT DETECTED.  The SARS-CoV-2 RNA is generally detectable in upper respiratory specimens during the acute phase of infection. The lowest concentration of SARS-CoV-2 viral copies this assay can detect is 138  copies/mL. A negative result does not preclude SARS-Cov-2 infection and should not be used as the sole basis for treatment or other patient  management decisions. A negative result may occur with  improper specimen collection/handling, submission of specimen other than nasopharyngeal swab, presence of viral mutation(s) within the areas targeted by this assay, and inadequate number of viral copies(<138 copies/mL). A negative result must be combined with clinical observations, patient history, and epidemiological information. The expected result is Negative.  Fact Sheet for Patients:  EntrepreneurPulse.com.au  Fact Sheet for Healthcare Providers:  IncredibleEmployment.be  This test is no t yet approved or cleared by the [place] FDA and  has been authorized for detection and/or diagnosis of SARS-CoV-2 by FDA under an Emergency Use Authorization (EUA). This EUA will remain  in effect (meaning this test can be used) for the duration of the COVID-19 declaration under Section 564(b)(1) of the Act, 21 U.S.C.section 360bbb-3(b)(1), unless the authorization is terminated  or revoked sooner.       Influenza A by PCR NEGATIVE NEGATIVE Final   Influenza B by PCR NEGATIVE NEGATIVE Final    Comment: (NOTE) The Xpert Xpress SARS-CoV-2/FLU/RSV plus assay is intended as an aid in the diagnosis of influenza from Nasopharyngeal swab specimens and should not be used as a sole basis for treatment. Nasal washings and aspirates are unacceptable for Xpert Xpress SARS-CoV-2/FLU/RSV testing.  Fact Sheet for Patients: EntrepreneurPulse.com.au  Fact Sheet for Healthcare Providers: IncredibleEmployment.be  This test is not yet approved or cleared by the [place] FDA and has been authorized for detection and/or diagnosis of SARS-CoV-2 by FDA under an Emergency Use Authorization (EUA). This EUA will remain in effect (meaning  this test can be used) for the duration of the COVID-19 declaration under Section 564(b)(1) of the Act, 21 U.S.C. section 360bbb-3(b)(1), unless the authorization is terminated or revoked.  Performed at [facility], [place] [address] [place], [place] [address]      Labs: CBC: Recent Labs  Lab 11/10/21 1636 11/11/21 1831 11/16/21 1111  WBC 10.2 10.6* 9.9  NEUTROABS 7.1 7.9*  --   HGB 13.7 12.5 11.5*  HCT 42.0 36.9 36.0  MCV 91.5 88.9 92.3  PLT 310 296 267    Basic Metabolic Panel: Recent Labs  Lab 11/10/21 1636 11/10/21 1836 11/11/21 1831 11/16/21 1111 11/17/21 0349  NA 134*  --  129* 139 139  K 4.0  --  3.7 4.5 3.8  CL 99  --  97* 103 103  CO2 24  --  24 29 26   GLUCOSE 249*  --  221* 153* 109*  BUN 18  --  12 15 16   CREATININE 1.20* 1.13* 1.21* 1.47* 1.38*  CALCIUM 9.1  --  8.2* 9.0 8.6*    Liver Function Tests: Recent Labs  Lab 11/10/21 1636 11/16/21 1111  AST 34 21  ALT 21 24  ALKPHOS 60 64  BILITOT 0.7 0.7  PROT 6.7 6.1*  ALBUMIN 3.7 3.3*    CBG: Recent Labs  Lab 11/16/21 1837 11/16/21 2057 11/17/21 0604 11/17/21 1125  GLUCAP 195* 125* 121* 116*    Thyroid function studies Recent Labs    11/16/21 1851  TSH 3.031     I discussed in detail with patient's grandson at bedside this morning.  I also discussed with patient's daughter-in-law via phone this afternoon at the time of discharge.  Updated care and answered all questions.  Time coordinating discharge: 25 minutes  SIGNED:  Vernell Leep, MD,  FACP, [facility], [facility], [facility] (Care Management Physician Certified). [facility]  To contact the attending provider between 7A-7P or the covering provider during  after hours 7P-7A, please log into the web site [email or web address] and access using universal [place] password for that web site. If you do not have the password, please call the hospital operator.

## 2021-11-17 NOTE — Discharge Instructions (Signed)

## 2021-11-22 ENCOUNTER — Telehealth (HOSPITAL_COMMUNITY): Payer: Self-pay | Admitting: Pharmacist

## 2021-11-22 ENCOUNTER — Other Ambulatory Visit (HOSPITAL_COMMUNITY): Payer: Self-pay

## 2021-11-22 NOTE — Telephone Encounter (Signed)
Transitions of Care Pharmacy   Call attempted for a pharmacy transitions of care follow-up. HIPAA appropriate voicemail was left with call back information provided.   Call attempt #1. Will follow-up in 1-3 days.

## 2021-11-23 ENCOUNTER — Ambulatory Visit (INDEPENDENT_AMBULATORY_CARE_PROVIDER_SITE_OTHER): Payer: Medicare HMO | Admitting: Family Medicine

## 2021-11-23 ENCOUNTER — Encounter: Payer: Self-pay | Admitting: Family Medicine

## 2021-11-23 ENCOUNTER — Other Ambulatory Visit: Payer: Self-pay

## 2021-11-23 VITALS — BP 116/82 | HR 95 | Temp 97.3°F | Ht 63.0 in | Wt 186.0 lb

## 2021-11-23 DIAGNOSIS — D649 Anemia, unspecified: Secondary | ICD-10-CM

## 2021-11-23 DIAGNOSIS — E119 Type 2 diabetes mellitus without complications: Secondary | ICD-10-CM | POA: Diagnosis not present

## 2021-11-23 DIAGNOSIS — R6881 Early satiety: Secondary | ICD-10-CM

## 2021-11-23 DIAGNOSIS — I2510 Atherosclerotic heart disease of native coronary artery without angina pectoris: Secondary | ICD-10-CM | POA: Diagnosis not present

## 2021-11-23 DIAGNOSIS — T8189XA Other complications of procedures, not elsewhere classified, initial encounter: Secondary | ICD-10-CM | POA: Diagnosis not present

## 2021-11-23 LAB — FOLATE: Folate: >24.2 ng/mL (ref >5.9)

## 2021-11-23 LAB — VITAMIN B12: Vitamin B-12: 1278 pg/mL — ABNORMAL HIGH (ref 211–911)

## 2021-11-23 LAB — IRON: Iron: 32 ug/dL — ABNORMAL LOW (ref 42–145)

## 2021-11-23 MED ORDER — NITROGLYCERIN 0.4 MG SL SUBL: 0.4000 mg | SUBLINGUAL_TABLET | SUBLINGUAL | 3 refills | Status: AC | PRN

## 2021-11-23 NOTE — Progress Notes (Signed)
This visit occurred during the SARS-CoV-2 public health emergency.  Safety protocols were in place, including screening questions prior to the visit, additional usage of staff PPE, and extensive cleaning of exam room while observing appropriate contact time as indicated for disinfecting solutions.  Inpatient f/u.    ===========================  Discharge Diagnoses:  Principal Problem:   CHF (congestive heart failure) ([facility]) Active Problems:   Acute CHF (congestive heart failure) ([facility])   Acute on chronic diastolic CHF (congestive heart failure) ([place])     Brief Summary: 88 year old female, her grandson lives with her, ambulates with the help of a cane, medical history significant for recent hospitalization 11/10/2021 - 11/12/2021 for inferior STEMI s/p PCI and new RCA stenting, HTN, CKD stage IIIb, chronic diastolic CHF, HLD, presented with complaints of worsening dyspnea, lightheaded, dizzy, fatigue and blurred vision.  Admitted for symptomatic bradycardia due to 2: 1 AV block due to carvedilol, discontinued and decompensated CHF.  Cardiology/EP cardiology consulted.     Assessment & Plan:    Symptomatic bradycardia and near syncope/2: 1 AV block: Due to recently started carvedilol 12.5 Mg twice daily.  Carvedilol discontinued.  TSH normal.  Cardiology/EP cardiology input appreciated.  Patient would like to avoid pacemaker implant if at all possible.  Telemetry review 1/14 shows fluctuations between sinus rhythm and bradycardia in the 40s-SR and possible 2: 1 AV block.  Mobilize.  PT evaluation. Subsequently seen by Dr. Percival Spanish.  She ambulated and her heart rate was fine without symptomatic bradycardia.  He has cleared her for discharge home.  I discussed with him.  Acute on chronic diastolic CHF: S/p IV Lasix 20 mg x 1 on 1/13 and 1/14.  As discussed with Dr. Percival Spanish, given her very advanced age, minimal volume overload status earlier, recommends as needed Lasix as noted below.  He has cleared  her for discharge home and patient already has a prior outpatient cardiology follow-up appointment on 1/23.  Recent STEMI, s/p RCA stenting, CAD: No anginal symptoms.  Troponins appropriately trending down compared to very recent hospitalization for MI.  Continue aspirin, Plavix, rosuvastatin, Zetia and losartan.  Acute kidney injury complicating CKD stage IIIb: Baseline creatinine probably in the 1.1 range.  Recent discharge at 1.21.  Presented with creatinine of 1.47.  This is improved to 1.38.  Follow BMP closely as outpatient.  Type II DM with hyperglycemia: Jardiance added by cardiology.  Hyponatremia: Possibly due to hypervolemic hyponatremia.  Resolved.    Anemia: Unclear etiology.  Follow CBC as outpatient.  Peripheral neuropathy: Has been going on for more than a year but not on treatment.  Offered to initiate gabapentin here versus outpatient with her PCP which she prefers.  Voltaren topical as needed.   =========================== Patient was admitted with MI, status post stenting.  She was able to be discharged but then was readmitted with bradycardia/CHF.  This was attributed to a beta-blocker which has been held in the meantime.  Inpatient course for both hospitalizations discussed with patient.  In the meantime no CP. Not SOB at rest, a little dyspnea with walking.  No BLE edema.  Recent anemia noted.  Discussed.  See notes on labs.  Patient and her grandson had noted relative slow healing with skin injuries.  Skin lesion excision previously done on the R shin, about 2 months ago, has derm f/u pending.  The excision site is still not healed.  Patient and her grandson have noticed changes in her eating habits.  Her taste is changed and she has early  satiety.  She has a variable taste preferences from day to day but she is not eating a lot of protein.  Her grandson began to notice these changes about 6-8 months.  She does not have clearly defined over current belly pain with eating  but she does have early satiety.  Meds, vitals, and allergies reviewed.   ROS: Per HPI unless specifically indicated in ROS section   GEN: nad, alert and oriented HEENT: ncat NECK: supple w/o LA CV: rrr PULM: ctab, no inc wob ABD: soft, +bs EXT: no edema SKIN: no acute rash but 1.5x1cm R anterior shin excision site still not fully healed.  33 minutes were devoted to patient care in this encounter (this includes time spent reviewing the patient's file/history, interviewing and examining the patient, counseling/reviewing plan with patient).

## 2021-11-23 NOTE — Patient Instructions (Signed)
Go to the lab on the way out.   If you have mychart we'll likely use that to update you.    Take care.  Glad to see you.  Plan on recheck in about 3 months.  We can do labs at the visit.  I'll update cardiology.

## 2021-11-24 DIAGNOSIS — I4430 Unspecified atrioventricular block: Secondary | ICD-10-CM | POA: Diagnosis not present

## 2021-11-24 DIAGNOSIS — I130 Hypertensive heart and chronic kidney disease with heart failure and stage 1 through stage 4 chronic kidney disease, or unspecified chronic kidney disease: Secondary | ICD-10-CM | POA: Diagnosis not present

## 2021-11-24 DIAGNOSIS — N179 Acute kidney failure, unspecified: Secondary | ICD-10-CM | POA: Diagnosis not present

## 2021-11-24 DIAGNOSIS — E1165 Type 2 diabetes mellitus with hyperglycemia: Secondary | ICD-10-CM | POA: Diagnosis not present

## 2021-11-24 DIAGNOSIS — G5600 Carpal tunnel syndrome, unspecified upper limb: Secondary | ICD-10-CM | POA: Diagnosis not present

## 2021-11-24 DIAGNOSIS — N1832 Chronic kidney disease, stage 3b: Secondary | ICD-10-CM | POA: Diagnosis not present

## 2021-11-24 DIAGNOSIS — I5033 Acute on chronic diastolic (congestive) heart failure: Secondary | ICD-10-CM | POA: Diagnosis not present

## 2021-11-24 DIAGNOSIS — I2111 ST elevation (STEMI) myocardial infarction involving right coronary artery: Secondary | ICD-10-CM | POA: Diagnosis not present

## 2021-11-24 DIAGNOSIS — I2510 Atherosclerotic heart disease of native coronary artery without angina pectoris: Secondary | ICD-10-CM | POA: Diagnosis not present

## 2021-11-24 DIAGNOSIS — E1122 Type 2 diabetes mellitus with diabetic chronic kidney disease: Secondary | ICD-10-CM | POA: Diagnosis not present

## 2021-11-24 NOTE — Progress Notes (Addendum)
Cardiology Office Note:    Date:  11/26/2021   ID:  Cathy Larsen, DOB 10/09/1926, MRN [id number]  PCP:  Tonia Ghent, MD   [facility] Providers Cardiologist:  Mertie Moores, MD { Click to update primary MD,subspecialty MD or APP then REFRESH:  Referring MD: Tonia Ghent, MD   Chief Complaint: post hospital follow-up CAD, heart block  History of Present Illness:    Cathy Larsen is a 88 y.o. female with a hx of CAD s/p DES to RCA, chronic HFpEF, DM, CKD stage 3b,  and HTN.   Previous cardiac cath in 1998 with 40% lesion in the LAD. She was referred by her PCP in 11/20 for evaluation of chest pain and seen by Dr. Acie Fredrickson. She reported a recent episode of chest pain in the middle of the night that lasted for several hours and was not severe. She thought it was related to doing yard work the day prior. Ischemic evaluation was deferred at that time due to patient's age and frailty. Her BP was markedly elevated at the visit. Her antihypertensive medications were changed and she was scheduled for a 1 month follow-up. She returned on 10/22/19 and seen by Robbie Lis, PA. She reported no recurrence of chest pain and her BP was at goal. She was advised to follow-up in 6 months.  On 11/10/21, she presented to the ED with arm pain/chest pain and EMS activated a STEMI en route to the hospital for EKG that revealed inferolateral findings consistent with ST elevation myocardial infarction. Her hs Troponin peaked at >24000 and she was taken emergently to the cardiac cath lab. Left heart cath revealed significant multivessel CAD with evidence for coronary calcification in the left coronary system with 60% pLAD stenosis followed by focal 80% stenosis with 50% mid stenosis; severel LCx 90% stenosis proximally followed by focal 85% then then total occlusion of the distal circ after a moderate sized marginal vessel and total occlusion of the proximal to mid RCA, mild LV dysfunction at 50% with mid to basal  inferior hypercontractility,  LVEDP 21 mmHg. She had successful PCI to totally occluded RCA w/3.5 x 22 mm DES with evidence for 50% stenosis in the distal RCA proximal to the PDA takeoff. Echo on 11/11/21 revealed LVEF 60-65%, regional wall abnormality with mild hypokinesis of LV, basal inferior wall, mild LVH, indeterminate diastolic filling due to E-A fusion, normal RV, moderately elevated pulm pressure, moderately dilated LA, mild to mod dilated RA, mod MR, mild TR, trivial AI, trivial PR. She was discharged 11/12/21. Unfortunately, she returned to the ED on 11/16/21 with generalized malaise, mild SOB, "discomfort all over." She reported feeling very fatigued since leaving the hospital. Initial EKG revealed nonspecific TW changes felt to be consistent with recent changes from recent inferior MI and HR of 77 bpm. Subsequent EKG revealed prolonged PR interval at 39 bpm and telemetry revealed SR with intermittent SB (rates 30-40s), occasional dropped beats. Her CXR revealed pulmonary edema and troponins revealed downtrending from recent MI [id number]. Her carvedilol was held for beta blocker washout and she was fel to be volume overloaded with BNP of 1161. EP consult was ordered.  EP consult review of telemetry revealed 2:1 AVB in the 40's mostly, and intermittently NSR. The patient reported that she preferred to avoid PPM insertion. She was felt stable for discharge home on 1/13 in the setting of able to ambulate without symptomatic bradycardia, medical management of CAD, management of acute on chronic HFpEF with  prn Lasix, and baseline creatinine of 1.1-1.2. She saw her PCP on 11/23/21 and creatinine was stable at 1.25.   Today, she is here with her grandson. States she feels fair since hospital discharge. She states her energy level is slowly improving but she has not returned to her usual state of activity prior to STEMI on 11/10/21.  Prior to hospitalization she was driving herself to church and to her hairdresser  and was doing some housework and cooking. Her favorite activity was gardening. She is using a pedal bike exerciser at home but does not feel comfortable walking outside without assistance. She hopes to be able to tend her garden and flowers in the spring. Her grandson states he did not drop her off at the door today and she was exhausted coming across the parking lot (about 100 feet).  He is weighing her daily and reports her home weight has been stable and she has not received any doses of prn Lasix.  She denies chest pain, lower extremity edema, palpitations, melena, hematuria, hemoptysis, diaphoresis, presyncope, syncope, orthopnea, and PND.  She has some shortness of breath that was not present prior to hospitalization. She will have home health PT starting this week. She is struggling with poor appetite but is aware of the importance of eating enough calories including lean protein while also limiting sodium.    Past Medical History:  Diagnosis Date   Arthritis    feet and hands   CAD (coronary artery disease)    cath 1998 with 40% lesion in LAD but no other disease   Carpal tunnel syndrome    Diabetes mellitus    Gout 03/1998   Hyperlipidemia 08/1997   Hypertension 09/23/1995   Skin cancer     Past Surgical History:  Procedure Laterality Date   BIOPSY BREAST  03/2000   Ductogram right breast w/ biopsy- obstructing lesion benign by path. (Dr. Annamaria Boots)    BLADDER SURGERY     bladder tack    CARDIAC CATHETERIZATION  03/1997   40% occulsion lad    CARPAL TUNNEL RELEASE Right 03/14/2014   Procedure: RIGHT CARPAL TUNNEL RELEASE;  Surgeon: Tennis Must, MD;  Location: [facility];  Service: Orthopedics;  Laterality: Right;   CATARACT EXTRACTION     CORONARY/GRAFT ACUTE MI REVASCULARIZATION N/A 11/10/2021   Procedure: Coronary/Graft Acute MI Revascularization;  Surgeon: Troy Sine, MD;  Location: [facility];  Service: Cardiovascular;  Laterality: N/A;   LEFT HEART CATH  AND CORONARY ANGIOGRAPHY N/A 11/10/2021   Procedure: LEFT HEART CATH AND CORONARY ANGIOGRAPHY;  Surgeon: Troy Sine, MD;  Location: [facility];  Service: Cardiovascular;  Laterality: N/A;   PARTIAL HYSTERECTOMY     VAGINAL DELIVERY     x 3   YAG LASER APPLICATION Right 07/08/4966   Procedure: YAG LASER APPLICATION;  Surgeon: Williams Che, MD;  Location: [facility];  Service: Ophthalmology;  Laterality: Right;    Current Medications: Current Meds  Medication Sig   aspirin EC 81 MG tablet Take 1 tablet (81 mg total) by mouth daily. Swallow whole.   Carboxymethylcellulose Sodium (THERATEARS) 0.25 % SOLN Place 1 drop into both eyes at bedtime.   clopidogrel (PLAVIX) 75 MG tablet Take 1 tablet (75 mg total) by mouth daily.   diclofenac Sodium (VOLTAREN) 1 % GEL Apply 4 g topically 4 (four) times daily as needed (feet pain).   empagliflozin (JARDIANCE) 10 MG TABS tablet Take 1 tablet (10 mg total) by  mouth daily.   ezetimibe (ZETIA) 10 MG tablet Take 1 tablet (10 mg total) by mouth daily.   famotidine (PEPCID) 10 MG tablet Take 10 mg by mouth daily as needed for heartburn or indigestion.   furosemide (LASIX) 20 MG tablet Take 1 tablet (20 mg total) by mouth as needed (For 3 pound weight gain in 24 hours or 5 pound weight gain in 1 week.).   Ginger, Zingiber officinalis, (GINGER ROOT) 500 MG CAPS Take 500 mg by mouth at bedtime.   glucose blood (ONE TOUCH ULTRA TEST) test strip USE DAILY TO CHECK SUGAR.  Dx 11.9.   losartan (COZAAR) 50 MG tablet Take 1 tablet (50 mg total) by mouth daily.   Magnesium 200 MG TABS Take 200 mg by mouth at bedtime.   Multiple Vitamin (MULTIVITAMIN WITH MINERALS) TABS Take 1 tablet by mouth at bedtime.   nitroGLYCERIN (NITROSTAT) 0.4 MG SL tablet Place 1 tablet (0.4 mg total) under the tongue every 5 (five) minutes as needed for chest pain (max 3 tabs in 15 minutes).   Omega 3-6-9 Fatty Acids (OMEGA-3-6-9 PO) Take 2,400 mg by mouth 3 (three) times a week.  liquid   Omega-3 Fatty Acids (FISH OIL) 1200 MG CAPS Take 1,200 mg by mouth daily.   rosuvastatin (CRESTOR) 10 MG tablet Take 1 tablet (10 mg total) by mouth daily.   Turmeric 500 MG CAPS Take 500 mg by mouth at bedtime.     Allergies:   Statins, Beta adrenergic blockers, Keflex [cephalexin], and Requip [ropinirole hcl]   Social History   Socioeconomic History   Marital status: Married    Spouse name: Not on file   Number of children: 3   Years of education: Not on file   Highest education level: Not on file  Occupational History   Occupation: Farms   Tobacco Use   Smoking status: Never   Smokeless tobacco: Never  Vaping Use   Vaping Use: Never used  Substance and Sexual Activity   Alcohol use: No    Alcohol/week: 0.0 standard drinks   Drug use: No   Sexual activity: Not Currently  Other Topics Concern   Not on file  Social History Narrative   Widowed 10/2012, husband had CVA and dementia   Grandson lives with patient as of 2018   Social Determinants of Health   Financial Resource Strain: Not on file  Food Insecurity: Not on file  Transportation Needs: Not on file  Physical Activity: Not on file  Stress: Not on file  Social Connections: Not on file     Family History: The patient's family history includes Cancer in her brother, brother, father, and mother; Hypertension in her sister and sister; Kidney failure in her mother. There is no history of Diabetes or Stroke.  ROS:   Please see the history of present illness. All other systems reviewed and are negative.  Labs/Other Studies Reviewed:    The following studies were reviewed today:  LHC 11/10/21    Ost RCA to Prox RCA lesion is 30% stenosed.   Prox RCA lesion is 100% stenosed.   Dist RCA lesion is 50% stenosed.   Prox Cx lesion is 90% stenosed.   Mid Cx lesion is 85% stenosed.   Dist Cx lesion is 100% stenosed.   Ost LAD to Prox LAD lesion is 60% stenosed.   Mid LAD-1 lesion is 80% stenosed.   Mid  LAD-2 lesion is 50% stenosed.   A drug-eluting stent was successfully placed.  Post intervention, there is a 0% residual stenosis.   There is mild left ventricular systolic dysfunction.   LV end diastolic pressure is mildly elevated.   The left ventricular ejection fraction is 50-55% by visual estimate.   Acute STEMI secondary to total proximal to mid occlusion of a large dominant RCA. Significant multivessel CAD with evidence for coronary calcification in the left coronary system with 60% proximal LAD stenosis followed by focal 80% stenosis with 50% mid stenosis; severe left circumflex stenoses of 90% proximally followed by focal 85% and then total occlusion of the distal circumflex after a moderate sized marginal vessel and total occlusion of the proximal to mid RCA. Mild LV dysfunction with EF estimated approximately 50% with mid to basal inferior hypocontractility.  LVEDP 21 mmHg. Successful PCI to the totally occluded RCA with ultimate insertion of a 3.5 x 22 mm Medtronic Onyx Frontier stent postdilated to 3.55 mm with the 100% occlusion being reduced to 0%.  There is evidence for 50% stenosis in the distal RCA proximal to the PDA takeoff. RECOMMENDATION: DAPT for minimum of 1 year.  The patient has significant disease involving her left coronary system.  Will review with colleagues regarding medical therapy versus attempt at circumflex intervention.  The patient reportedly has an allergy to statin.  Will initiate Zetia.  However may consider rechallenge low-dose statin if myalgias were only an issue.  Optimal blood pressure control.  Echo 11/11/21  Left Ventricle: Left ventricular ejection fraction, by estimation, is 60  to 65%. The left ventricle has normal function. The left ventricle  demonstrates regional wall motion abnormalities. Mild hypokinesis of the  left ventricular, basal inferior wall.  The left ventricular internal cavity size was normal in size. There is  mild concentric left  ventricular hypertrophy. Indeterminate diastolic  filling due to E-A fusion.  Right Ventricle: The right ventricular size is normal. No increase in  right ventricular wall thickness. Right ventricular systolic function is  normal. There is moderately elevated pulmonary artery systolic pressure.  The tricuspid regurgitant velocity is  2.85 m/s, and with an assumed right atrial pressure of 15 mmHg, the  estimated right ventricular systolic pressure is 73.7 mmHg.  Left Atrium: Left atrial size was moderately dilated.  Right Atrium: Right atrial size was mild to moderately dilated.  Pericardium: There is no evidence of pericardial effusion. Presence of  epicardial fat layer.  Mitral Valve: The mitral valve is grossly normal. Mild to moderate mitral  annular calcification. Moderate mitral valve regurgitation.  Tricuspid Valve: The tricuspid valve is normal in structure. Tricuspid  valve regurgitation is mild . No evidence of tricuspid stenosis.  Aortic Valve: The aortic valve is grossly normal. There is mild  calcification of the aortic valve. Aortic valve regurgitation is trivial.  No aortic stenosis is present. Aortic valve mean gradient measures 2.0  mmHg. Aortic valve peak gradient measures 4.0   mmHg. Aortic valve area, by VTI measures 2.29 cm.  Pulmonic Valve: The pulmonic valve was not well visualized. Pulmonic valve  regurgitation is trivial. No evidence of pulmonic stenosis.  Aorta: Aortic dilatation noted. There is borderline dilatation of the  ascending aorta, measuring 38 mm.  Venous: The inferior vena cava is dilated in size with less than 50%  respiratory variability, suggesting right atrial pressure of 15 mmHg.  IAS/Shunts: The atrial septum is grossly normal.   Echo 5/18  Left ventricle:  The cavity size was normal. Wall thickness was  increased in a pattern of mild LVH. Systolic  function was vigorous.  The estimated ejection fraction was in the range of 65% to 70%.   Wall motion was normal; there were no regional wall motion  abnormalities. Doppler parameters are consistent with abnormal left  ventricular relaxation (grade 1 diastolic dysfunction).  Aortic valve:   Trileaflet; mildly calcified leaflets. Mobility was  not restricted.  Doppler:  Transvalvular velocity was within the  normal range. There was no stenosis. There was trivial  regurgitation.  Aorta: Aortic root: The aortic root was normal in size.  Mitral valve:   Structurally normal valve.   Mobility was not  restricted.  Doppler:  Transvalvular velocity was within the normal  range. There was no evidence for stenosis. There was mild  regurgitation.    Peak gradient (D): 4 mm Hg.  Left atrium:  The atrium was normal in size.  Right ventricle:  The cavity size was normal. Systolic function was  normal.  Pulmonic valve:    Doppler:  Transvalvular velocity was within the  normal range. There was no evidence for stenosis.  Tricuspid valve:   Structurally normal valve.    Doppler:  Transvalvular velocity was within the normal range. There was  trivial regurgitation.  Pulmonary artery:   Systolic pressure was within the normal range.  Right atrium:  The atrium was normal in size.  Pericardium: There was no pericardial effusion.  Systemic veins:  Inferior vena cava: The vessel was normal in size. The  respirophasic diameter changes were in the normal range (= 50%),  consistent with normal central venous pressure. Diameter: 16 mm.   Recent Labs: 11/16/2021: B Natriuretic Peptide 1,161.8; TSH 3.031 11/23/2021: ALT 19; BUN 15; Creat 1.25; Hemoglobin 13.0; Platelets 419; Potassium 4.6; Sodium 139  Recent Lipid Panel    Component Value Date/Time   CHOL 203 (H) 11/11/2021 1831   TRIG 175 (H) 11/11/2021 1831   HDL 32 (L) 11/11/2021 1831   CHOLHDL 6.3 11/11/2021 1831   VLDL 35 11/11/2021 1831   LDLCALC 136 (H) 11/11/2021 1831   LDLDIRECT 142.1 01/04/2013 1001     Risk  Assessment/Calculations:      Physical Exam:    VS:  BP 118/60    Pulse 82    Ht 5\' 3"  (1.6 m)    Wt 186 lb (84.4 kg)    SpO2 90%    BMI 32.95 kg/m     Wt Readings from Last 3 Encounters:  11/26/21 186 lb (84.4 kg)  11/23/21 186 lb (84.4 kg)  11/17/21 186 lb 15.2 oz (84.8 kg)     GEN:  Well nourished, well developed in no acute distress HEENT: Normal NECK: No JVD; No carotid bruits CARDIAC: Irregular RR, no murmurs, rubs, gallops RESPIRATORY:  Clear to auscultation without rales, wheezing or rhonchi  ABDOMEN: Soft, non-tender, non-distended MUSCULOSKELETAL:  No edema; No deformity. 2+ pedal pulses, equal bilaterally SKIN: Warm and dry NEUROLOGIC:  Alert and oriented x 3 PSYCHIATRIC:  Normal affect   EKG:  EKG is ordered today.  The ekg ordered today demonstrates SR at rate of 82 bpm with 1st degree AV block with sinus arrhythmia  Diagnoses:    1. Essential hypertension   2. Coronary artery disease involving native coronary artery of native heart without angina pectoris   3. Hyperlipidemia LDL goal <70   4. Chronic heart failure with preserved ejection fraction (HFpEF) (HCC)   5. 1st degree AV block   6. Type 2 diabetes mellitus with complication, without long-term current use of insulin ([place])  Assessment and Plan:     CAD native without angina, s/p STEMI, s/p DES to RCA: She denies chest pain. Reports shortness of breath with some activity in association with fatigue. Feels that this is slowly improving. Is not very active currently, but will be starting home physical therapy soon. Do not feel that dyspnea is indication to increase anti-anginal medication at this time, suspect her age and hospitalization are contributing. Encouraged her and her grandson to contact us if dyspnea or fatigue worsen. No beta blocker/CCB due to AV block. Continue clopidogrel, furosemide, statin, aspirin.   1st Degree AV Block with Sinus Arrhythmia:  Rate and PR interval have improved since  hospital d/c on 1/13. She denies dizziness, lightheadedness, presyncope, syncope. Avoid AV nodal and CCB agents. 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  Encouraged patient and grandson to reach out to [place] if she develops dizziness, presyncope, syncope, worsening fatigue or other concerning symptoms.   Chronic HFpEF: Weight is stable at home. She appears euvolemic on exam.  She states that she gave out of breath walking in today but denies orthopnea, PND, edema. Has not taken Lasix since hospital discharge due to stable weight. Continue daily weights and prn Lasix for weight gain > 2 lbs in 24 hours or > 5 lbs in 1 week. We discussed limiting sodium and not exceeding 64 oz fluid daily. Continue empagliflozin, Lasix prn, and losartan.   Essential hypertension: Blood pressure is stable today.  Her grandson reports stable blood pressure readings at home.  Continue losartan.  Hyperlipidemia LDL goal < 70: LDL 136 on 11/11/21. Will recheck at next office visit. Encouraged low cholesterol, heart healthy diet. Continue Zetia, rosuvastatin.   Diabetes mellitus: A1C 8.7 on 11/10/21. She is working on reducing sugar in her diet. Continue empagliflozin. Management per PCP.   Disposition: 5 months with Dr. Acie Fredrickson      Cardiac Rehabilitation Eligibility Assessment  The patient has declined or is not appropriate for cardiac rehabilitation.         Medication Adjustments/Labs and Tests Ordered: Current medicines are reviewed at length with the patient today.  Concerns regarding medicines are outlined above.  Orders Placed This Encounter  Procedures   Lipid panel   EKG 12-Lead   No orders of the defined types were placed in this encounter.   Patient Instructions  Medication Instructions:  Your Physician recommend you continue on your current medication as directed.    *If you need a refill on your cardiac medications before your  next appointment, please call your pharmacy*   Lab Work: Please come fasting to your follow up appointment with Dr. Acie Fredrickson!  Testing/Procedures: None ordered today    Follow-Up: At [facility], you and your health needs are our priority.  As part of our continuing mission to provide you with exceptional heart care, we have created designated Provider Care Teams.  These Care Teams include your primary Cardiologist (physician) and Advanced Practice Providers (APPs -  Physician Assistants and Nurse Practitioners) who all work together to provide you with the care you need, when you need it.  We recommend signing up for the patient portal called "MyChart".  Sign up information is provided on this After Visit Summary.  MyChart is used to connect with patients for Virtual Visits (Telemedicine).  Patients are able to view lab/test results, encounter notes, upcoming appointments, etc.  Non-urgent messages can be sent to your provider  as well.   To learn more about what you can do with MyChart, go to [email or web address].    Your next appointment:   Follow up as scheduled with Dr. Acie Fredrickson!     Signed, Emmaline Life, NP  11/26/2021 3:42 PM    [facility]

## 2021-11-25 DIAGNOSIS — T8189XA Other complications of procedures, not elsewhere classified, initial encounter: Secondary | ICD-10-CM | POA: Insufficient documentation

## 2021-11-25 DIAGNOSIS — D649 Anemia, unspecified: Secondary | ICD-10-CM | POA: Insufficient documentation

## 2021-11-25 NOTE — Assessment & Plan Note (Signed)
Unclear if this is diet related (i.e. due to early satiety).  She is going to follow with dermatology.  The lesion does not appear infected.  See notes on labs.

## 2021-11-25 NOTE — Assessment & Plan Note (Signed)
In the setting of recent MI, status post stent, then with bradycardia requiring cessation of beta-blocker with cardiology follow-up pending.  We talked about rechecking routine labs today.  She not having chest pain but she does get slightly dyspneic with exertion.  No shortness of breath at rest.  It is unclear to me if early satiety is related to her coronary disease.  It could be that she has gastroparesis but she could have abdominal atherosclerotic changes (though I would expect pain with eating in that situation).  I want to check her baseline labs today and then we can see about follow-up from that point forward.

## 2021-11-25 NOTE — Assessment & Plan Note (Signed)
Discussed.  See notes on follow-up labs.

## 2021-11-26 ENCOUNTER — Telehealth (HOSPITAL_COMMUNITY): Payer: Self-pay | Admitting: Pharmacist

## 2021-11-26 ENCOUNTER — Telehealth: Payer: Self-pay

## 2021-11-26 ENCOUNTER — Other Ambulatory Visit: Payer: Self-pay

## 2021-11-26 ENCOUNTER — Encounter (HOSPITAL_BASED_OUTPATIENT_CLINIC_OR_DEPARTMENT_OTHER): Payer: Self-pay | Admitting: Nurse Practitioner

## 2021-11-26 ENCOUNTER — Other Ambulatory Visit (HOSPITAL_COMMUNITY): Payer: Self-pay

## 2021-11-26 ENCOUNTER — Ambulatory Visit (HOSPITAL_BASED_OUTPATIENT_CLINIC_OR_DEPARTMENT_OTHER): Payer: Medicare HMO | Admitting: Nurse Practitioner

## 2021-11-26 VITALS — BP 118/60 | HR 82 | Ht 63.0 in | Wt 186.0 lb

## 2021-11-26 DIAGNOSIS — I2510 Atherosclerotic heart disease of native coronary artery without angina pectoris: Secondary | ICD-10-CM | POA: Diagnosis not present

## 2021-11-26 DIAGNOSIS — I5032 Chronic diastolic (congestive) heart failure: Secondary | ICD-10-CM

## 2021-11-26 DIAGNOSIS — E118 Type 2 diabetes mellitus with unspecified complications: Secondary | ICD-10-CM | POA: Diagnosis not present

## 2021-11-26 DIAGNOSIS — E785 Hyperlipidemia, unspecified: Secondary | ICD-10-CM | POA: Diagnosis not present

## 2021-11-26 DIAGNOSIS — I10 Essential (primary) hypertension: Secondary | ICD-10-CM | POA: Diagnosis not present

## 2021-11-26 DIAGNOSIS — I440 Atrioventricular block, first degree: Secondary | ICD-10-CM

## 2021-11-26 NOTE — Telephone Encounter (Signed)
Pharmacy Transitions of Care Follow-up Telephone Call  Date of discharge: 11/12/21  Discharge Diagnosis: stent   Spoke with Cathy Larsen, pts grandson, who cares for her medications.   Medication changes made at discharge:  - START: Aspirin Low Dose (aspirin)  This replaces a similar medication. See the full medication list for instructions. clopidogrel (PLAVIX)  ezetimibe (ZETIA)  Jardiance (empagliflozin)  rosuvastatin (CRESTOR)   - STOPPED:  aspirin 325 MG tablet  Replaced by a similar medication. hydrochlorothiazide 25 MG tablet (HYDRODIURIL)  potassium chloride 10 MEQ tablet (KLOR-CON)   - CHANGED: n/a  Medication changes verified by the patient? Yes    Medication Accessibility:  Home Pharmacy: [facility] [address]   Was the patient provided with refills on discharged medications? yes   Have all prescriptions been transferred from [facility] to home pharmacy? yes   Is the patient able to afford medications? yes Notable copays: jardiance ($47) Eligible patient assistance: n/a    Medication Review:  CLOPIDOGREL (PLAVIX) Clopidogrel 75 mg once daily.  - Educated patient on expected duration of therapy of ASA with clopidogrel.  - Reviewed potential DDIs with patient  - Advised patient of medications to avoid (NSAIDs, ASA)  - Educated that Tylenol (acetaminophen) will be the preferred analgesic to prevent risk of bleeding  - Emphasized importance of monitoring for signs and symptoms of bleeding (abnormal bruising, prolonged bleeding, nose bleeds, bleeding from gums, discolored urine, black tarry stools)  - Advised patient to alert all providers of anticoagulation therapy prior to starting a new medication or having a procedure    Follow-up Appointments:  [facility] f/u appt confirmed?  Saw Laurann Montana, cardiology today on 11/26/21.   If their condition worsens, is the pt aware to call PCP or go to the Emergency Dept.? yes  Final Patient Assessment: Pts grandson is her  caretaker.  Reports compliance with meds and tolerating them well.  Was concerned about refills and would like to have these transferred to pts home pharmacy.

## 2021-11-26 NOTE — Telephone Encounter (Signed)
Please give the order.  Thanks.   

## 2021-11-26 NOTE — Patient Instructions (Signed)
Medication Instructions:  Your Physician recommend you continue on your current medication as directed.    *If you need a refill on your cardiac medications before your next appointment, please call your pharmacy*   Lab Work: Please come fasting to your follow up appointment with Dr. Acie Fredrickson!  Testing/Procedures: None ordered today    Follow-Up: At [facility], you and your health needs are our priority.  As part of our continuing mission to provide you with exceptional heart care, we have created designated Provider Care Teams.  These Care Teams include your primary Cardiologist (physician) and Advanced Practice Providers (APPs -  Physician Assistants and Nurse Practitioners) who all work together to provide you with the care you need, when you need it.  We recommend signing up for the patient portal called "MyChart".  Sign up information is provided on this After Visit Summary.  MyChart is used to connect with patients for Virtual Visits (Telemedicine).  Patients are able to view lab/test results, encounter notes, upcoming appointments, etc.  Non-urgent messages can be sent to your provider as well.   To learn more about what you can do with MyChart, go to [email or web address].    Your next appointment:   Follow up as scheduled with Dr. Acie Fredrickson!

## 2021-11-26 NOTE — Telephone Encounter (Signed)
Cathy Larsen [phone] [facility].  Would like verbal orders for home heath PT  1 time week for one week  2 times week for 4 weeks.  1 time a week for two weeks.

## 2021-11-26 NOTE — Telephone Encounter (Signed)
Transitions of Care Pharmacy   Call attempted for a pharmacy transitions of care follow-up. HIPAA appropriate voicemail was left with call back information provided.   Call attempt #2. Will follow-up in 1-3 days.

## 2021-11-27 NOTE — Telephone Encounter (Signed)
Verbal orders have been given  

## 2021-11-29 DIAGNOSIS — E1122 Type 2 diabetes mellitus with diabetic chronic kidney disease: Secondary | ICD-10-CM | POA: Diagnosis not present

## 2021-11-29 DIAGNOSIS — I5033 Acute on chronic diastolic (congestive) heart failure: Secondary | ICD-10-CM | POA: Diagnosis not present

## 2021-11-29 DIAGNOSIS — I130 Hypertensive heart and chronic kidney disease with heart failure and stage 1 through stage 4 chronic kidney disease, or unspecified chronic kidney disease: Secondary | ICD-10-CM | POA: Diagnosis not present

## 2021-11-29 DIAGNOSIS — G5600 Carpal tunnel syndrome, unspecified upper limb: Secondary | ICD-10-CM | POA: Diagnosis not present

## 2021-11-29 DIAGNOSIS — I2111 ST elevation (STEMI) myocardial infarction involving right coronary artery: Secondary | ICD-10-CM | POA: Diagnosis not present

## 2021-11-29 DIAGNOSIS — N179 Acute kidney failure, unspecified: Secondary | ICD-10-CM | POA: Diagnosis not present

## 2021-11-29 DIAGNOSIS — I2510 Atherosclerotic heart disease of native coronary artery without angina pectoris: Secondary | ICD-10-CM | POA: Diagnosis not present

## 2021-11-29 DIAGNOSIS — N1832 Chronic kidney disease, stage 3b: Secondary | ICD-10-CM | POA: Diagnosis not present

## 2021-11-29 DIAGNOSIS — I4430 Unspecified atrioventricular block: Secondary | ICD-10-CM | POA: Diagnosis not present

## 2021-11-29 DIAGNOSIS — E1165 Type 2 diabetes mellitus with hyperglycemia: Secondary | ICD-10-CM | POA: Diagnosis not present

## 2021-12-01 LAB — COMPREHENSIVE METABOLIC PANEL
AG Ratio: 1.7 (calc) (ref 1.0–2.5)
ALT: 19 U/L (ref 6–29)
AST: 18 U/L (ref 10–35)
Albumin: 4.2 g/dL (ref 3.6–5.1)
Alkaline phosphatase (APISO): 71 U/L (ref 37–153)
BUN/Creatinine Ratio: 12 (calc) (ref 6–22)
BUN: 15 mg/dL (ref 7–25)
CO2: 31 mmol/L (ref 20–32)
Calcium: 9.7 mg/dL (ref 8.6–10.4)
Chloride: 102 mmol/L (ref 98–110)
Creat: 1.25 mg/dL — ABNORMAL HIGH (ref 0.60–0.95)
Globulin: 2.5 g/dL (calc) (ref 1.9–3.7)
Glucose, Bld: 121 mg/dL — ABNORMAL HIGH (ref 65–99)
Potassium: 4.6 mmol/L (ref 3.5–5.3)
Sodium: 139 mmol/L (ref 135–146)
Total Bilirubin: 0.5 mg/dL (ref 0.2–1.2)
Total Protein: 6.7 g/dL (ref 6.1–8.1)

## 2021-12-01 LAB — CBC WITH DIFFERENTIAL/PLATELET
Absolute Monocytes: 970 cells/uL — ABNORMAL HIGH (ref 200–950)
Basophils Absolute: 55 cells/uL (ref 0–200)
Basophils Relative: 0.5 %
Eosinophils Absolute: 262 cells/uL (ref 15–500)
Eosinophils Relative: 2.4 %
HCT: 38.6 % (ref 35.0–45.0)
Hemoglobin: 13 g/dL (ref 11.7–15.5)
Lymphs Abs: 1918 cells/uL (ref 850–3900)
MCH: 30 pg (ref 27.0–33.0)
MCHC: 33.7 g/dL (ref 32.0–36.0)
MCV: 88.9 fL (ref 80.0–100.0)
MPV: 9.6 fL (ref 7.5–12.5)
Monocytes Relative: 8.9 %
Neutro Abs: 7695 cells/uL (ref 1500–7800)
Neutrophils Relative %: 70.6 %
Platelets: 419 10*3/uL — ABNORMAL HIGH (ref 140–400)
RBC: 4.34 10*6/uL (ref 3.80–5.10)
RDW: 12.3 % (ref 11.0–15.0)
Total Lymphocyte: 17.6 %
WBC: 10.9 10*3/uL — ABNORMAL HIGH (ref 3.8–10.8)

## 2021-12-01 LAB — ZINC

## 2021-12-01 LAB — PREALBUMIN: Prealbumin: 20 mg/dL (ref 17–34)

## 2021-12-01 LAB — LIPASE: Lipase: 54 U/L (ref 7–60)

## 2021-12-01 LAB — VITAMIN C: Vitamin C: 1.1 mg/dL (ref 0.3–2.7)

## 2021-12-03 ENCOUNTER — Other Ambulatory Visit: Payer: Self-pay | Admitting: Family Medicine

## 2021-12-03 DIAGNOSIS — E1165 Type 2 diabetes mellitus with hyperglycemia: Secondary | ICD-10-CM | POA: Diagnosis not present

## 2021-12-03 DIAGNOSIS — E119 Type 2 diabetes mellitus without complications: Secondary | ICD-10-CM

## 2021-12-03 DIAGNOSIS — I2111 ST elevation (STEMI) myocardial infarction involving right coronary artery: Secondary | ICD-10-CM | POA: Diagnosis not present

## 2021-12-03 DIAGNOSIS — I5033 Acute on chronic diastolic (congestive) heart failure: Secondary | ICD-10-CM | POA: Diagnosis not present

## 2021-12-03 DIAGNOSIS — N179 Acute kidney failure, unspecified: Secondary | ICD-10-CM | POA: Diagnosis not present

## 2021-12-03 DIAGNOSIS — I130 Hypertensive heart and chronic kidney disease with heart failure and stage 1 through stage 4 chronic kidney disease, or unspecified chronic kidney disease: Secondary | ICD-10-CM | POA: Diagnosis not present

## 2021-12-03 DIAGNOSIS — G5600 Carpal tunnel syndrome, unspecified upper limb: Secondary | ICD-10-CM | POA: Diagnosis not present

## 2021-12-03 DIAGNOSIS — I2510 Atherosclerotic heart disease of native coronary artery without angina pectoris: Secondary | ICD-10-CM | POA: Diagnosis not present

## 2021-12-03 DIAGNOSIS — I4430 Unspecified atrioventricular block: Secondary | ICD-10-CM | POA: Diagnosis not present

## 2021-12-03 DIAGNOSIS — E611 Iron deficiency: Secondary | ICD-10-CM

## 2021-12-03 DIAGNOSIS — E1122 Type 2 diabetes mellitus with diabetic chronic kidney disease: Secondary | ICD-10-CM | POA: Diagnosis not present

## 2021-12-03 DIAGNOSIS — N1832 Chronic kidney disease, stage 3b: Secondary | ICD-10-CM | POA: Diagnosis not present

## 2021-12-05 ENCOUNTER — Other Ambulatory Visit (HOSPITAL_COMMUNITY): Payer: Self-pay

## 2021-12-05 DIAGNOSIS — I5033 Acute on chronic diastolic (congestive) heart failure: Secondary | ICD-10-CM | POA: Diagnosis not present

## 2021-12-05 DIAGNOSIS — I4430 Unspecified atrioventricular block: Secondary | ICD-10-CM | POA: Diagnosis not present

## 2021-12-05 DIAGNOSIS — I2111 ST elevation (STEMI) myocardial infarction involving right coronary artery: Secondary | ICD-10-CM | POA: Diagnosis not present

## 2021-12-05 DIAGNOSIS — N1832 Chronic kidney disease, stage 3b: Secondary | ICD-10-CM | POA: Diagnosis not present

## 2021-12-05 DIAGNOSIS — I2510 Atherosclerotic heart disease of native coronary artery without angina pectoris: Secondary | ICD-10-CM | POA: Diagnosis not present

## 2021-12-05 DIAGNOSIS — E1165 Type 2 diabetes mellitus with hyperglycemia: Secondary | ICD-10-CM | POA: Diagnosis not present

## 2021-12-05 DIAGNOSIS — I130 Hypertensive heart and chronic kidney disease with heart failure and stage 1 through stage 4 chronic kidney disease, or unspecified chronic kidney disease: Secondary | ICD-10-CM | POA: Diagnosis not present

## 2021-12-05 DIAGNOSIS — E1122 Type 2 diabetes mellitus with diabetic chronic kidney disease: Secondary | ICD-10-CM | POA: Diagnosis not present

## 2021-12-05 DIAGNOSIS — N179 Acute kidney failure, unspecified: Secondary | ICD-10-CM | POA: Diagnosis not present

## 2021-12-05 DIAGNOSIS — G5600 Carpal tunnel syndrome, unspecified upper limb: Secondary | ICD-10-CM | POA: Diagnosis not present

## 2021-12-10 DIAGNOSIS — N1832 Chronic kidney disease, stage 3b: Secondary | ICD-10-CM | POA: Diagnosis not present

## 2021-12-10 DIAGNOSIS — I2111 ST elevation (STEMI) myocardial infarction involving right coronary artery: Secondary | ICD-10-CM | POA: Diagnosis not present

## 2021-12-10 DIAGNOSIS — G5600 Carpal tunnel syndrome, unspecified upper limb: Secondary | ICD-10-CM | POA: Diagnosis not present

## 2021-12-10 DIAGNOSIS — E1165 Type 2 diabetes mellitus with hyperglycemia: Secondary | ICD-10-CM | POA: Diagnosis not present

## 2021-12-10 DIAGNOSIS — I4430 Unspecified atrioventricular block: Secondary | ICD-10-CM | POA: Diagnosis not present

## 2021-12-10 DIAGNOSIS — I5033 Acute on chronic diastolic (congestive) heart failure: Secondary | ICD-10-CM | POA: Diagnosis not present

## 2021-12-10 DIAGNOSIS — I2510 Atherosclerotic heart disease of native coronary artery without angina pectoris: Secondary | ICD-10-CM | POA: Diagnosis not present

## 2021-12-10 DIAGNOSIS — E1122 Type 2 diabetes mellitus with diabetic chronic kidney disease: Secondary | ICD-10-CM | POA: Diagnosis not present

## 2021-12-10 DIAGNOSIS — N179 Acute kidney failure, unspecified: Secondary | ICD-10-CM | POA: Diagnosis not present

## 2021-12-10 DIAGNOSIS — I130 Hypertensive heart and chronic kidney disease with heart failure and stage 1 through stage 4 chronic kidney disease, or unspecified chronic kidney disease: Secondary | ICD-10-CM | POA: Diagnosis not present

## 2021-12-13 DIAGNOSIS — I5033 Acute on chronic diastolic (congestive) heart failure: Secondary | ICD-10-CM | POA: Diagnosis not present

## 2021-12-13 DIAGNOSIS — I2111 ST elevation (STEMI) myocardial infarction involving right coronary artery: Secondary | ICD-10-CM | POA: Diagnosis not present

## 2021-12-13 DIAGNOSIS — E1165 Type 2 diabetes mellitus with hyperglycemia: Secondary | ICD-10-CM | POA: Diagnosis not present

## 2021-12-13 DIAGNOSIS — N179 Acute kidney failure, unspecified: Secondary | ICD-10-CM | POA: Diagnosis not present

## 2021-12-13 DIAGNOSIS — N1832 Chronic kidney disease, stage 3b: Secondary | ICD-10-CM | POA: Diagnosis not present

## 2021-12-13 DIAGNOSIS — G5600 Carpal tunnel syndrome, unspecified upper limb: Secondary | ICD-10-CM | POA: Diagnosis not present

## 2021-12-13 DIAGNOSIS — E1122 Type 2 diabetes mellitus with diabetic chronic kidney disease: Secondary | ICD-10-CM | POA: Diagnosis not present

## 2021-12-13 DIAGNOSIS — I130 Hypertensive heart and chronic kidney disease with heart failure and stage 1 through stage 4 chronic kidney disease, or unspecified chronic kidney disease: Secondary | ICD-10-CM | POA: Diagnosis not present

## 2021-12-13 DIAGNOSIS — I2510 Atherosclerotic heart disease of native coronary artery without angina pectoris: Secondary | ICD-10-CM | POA: Diagnosis not present

## 2021-12-13 DIAGNOSIS — I4430 Unspecified atrioventricular block: Secondary | ICD-10-CM | POA: Diagnosis not present

## 2021-12-17 ENCOUNTER — Telehealth: Payer: Self-pay | Admitting: Family Medicine

## 2021-12-17 DIAGNOSIS — I2510 Atherosclerotic heart disease of native coronary artery without angina pectoris: Secondary | ICD-10-CM | POA: Diagnosis not present

## 2021-12-17 DIAGNOSIS — N179 Acute kidney failure, unspecified: Secondary | ICD-10-CM | POA: Diagnosis not present

## 2021-12-17 DIAGNOSIS — I4430 Unspecified atrioventricular block: Secondary | ICD-10-CM | POA: Diagnosis not present

## 2021-12-17 DIAGNOSIS — E1165 Type 2 diabetes mellitus with hyperglycemia: Secondary | ICD-10-CM | POA: Diagnosis not present

## 2021-12-17 DIAGNOSIS — E1122 Type 2 diabetes mellitus with diabetic chronic kidney disease: Secondary | ICD-10-CM | POA: Diagnosis not present

## 2021-12-17 DIAGNOSIS — G5600 Carpal tunnel syndrome, unspecified upper limb: Secondary | ICD-10-CM | POA: Diagnosis not present

## 2021-12-17 DIAGNOSIS — I5033 Acute on chronic diastolic (congestive) heart failure: Secondary | ICD-10-CM | POA: Diagnosis not present

## 2021-12-17 DIAGNOSIS — I2111 ST elevation (STEMI) myocardial infarction involving right coronary artery: Secondary | ICD-10-CM | POA: Diagnosis not present

## 2021-12-17 DIAGNOSIS — N1832 Chronic kidney disease, stage 3b: Secondary | ICD-10-CM | POA: Diagnosis not present

## 2021-12-17 DIAGNOSIS — I130 Hypertensive heart and chronic kidney disease with heart failure and stage 1 through stage 4 chronic kidney disease, or unspecified chronic kidney disease: Secondary | ICD-10-CM | POA: Diagnosis not present

## 2021-12-17 NOTE — Telephone Encounter (Signed)
Home Health verbal orders Caller Name: [place] Name: Myrtie Cruise number: [phone]  Requesting OT/PT/Skilled nursing/Social Work/Speech: pt  Reason: extend pt  Frequency: 2w3 1w1  Please forward to [facility] pool or providers CMA

## 2021-12-17 NOTE — Telephone Encounter (Signed)
Please give the order.  Thanks.   

## 2021-12-17 NOTE — Telephone Encounter (Signed)
Verbal orders have been given to [place] at [facility].

## 2021-12-20 DIAGNOSIS — N179 Acute kidney failure, unspecified: Secondary | ICD-10-CM | POA: Diagnosis not present

## 2021-12-20 DIAGNOSIS — N1832 Chronic kidney disease, stage 3b: Secondary | ICD-10-CM | POA: Diagnosis not present

## 2021-12-20 DIAGNOSIS — I130 Hypertensive heart and chronic kidney disease with heart failure and stage 1 through stage 4 chronic kidney disease, or unspecified chronic kidney disease: Secondary | ICD-10-CM | POA: Diagnosis not present

## 2021-12-20 DIAGNOSIS — E1165 Type 2 diabetes mellitus with hyperglycemia: Secondary | ICD-10-CM | POA: Diagnosis not present

## 2021-12-20 DIAGNOSIS — G5600 Carpal tunnel syndrome, unspecified upper limb: Secondary | ICD-10-CM | POA: Diagnosis not present

## 2021-12-20 DIAGNOSIS — I2111 ST elevation (STEMI) myocardial infarction involving right coronary artery: Secondary | ICD-10-CM | POA: Diagnosis not present

## 2021-12-20 DIAGNOSIS — I5033 Acute on chronic diastolic (congestive) heart failure: Secondary | ICD-10-CM | POA: Diagnosis not present

## 2021-12-20 DIAGNOSIS — E1122 Type 2 diabetes mellitus with diabetic chronic kidney disease: Secondary | ICD-10-CM | POA: Diagnosis not present

## 2021-12-20 DIAGNOSIS — I2510 Atherosclerotic heart disease of native coronary artery without angina pectoris: Secondary | ICD-10-CM | POA: Diagnosis not present

## 2021-12-20 DIAGNOSIS — I4430 Unspecified atrioventricular block: Secondary | ICD-10-CM | POA: Diagnosis not present

## 2021-12-21 DIAGNOSIS — E1122 Type 2 diabetes mellitus with diabetic chronic kidney disease: Secondary | ICD-10-CM | POA: Diagnosis not present

## 2021-12-21 DIAGNOSIS — G5600 Carpal tunnel syndrome, unspecified upper limb: Secondary | ICD-10-CM | POA: Diagnosis not present

## 2021-12-21 DIAGNOSIS — I2510 Atherosclerotic heart disease of native coronary artery without angina pectoris: Secondary | ICD-10-CM | POA: Diagnosis not present

## 2021-12-21 DIAGNOSIS — I5033 Acute on chronic diastolic (congestive) heart failure: Secondary | ICD-10-CM | POA: Diagnosis not present

## 2021-12-21 DIAGNOSIS — I130 Hypertensive heart and chronic kidney disease with heart failure and stage 1 through stage 4 chronic kidney disease, or unspecified chronic kidney disease: Secondary | ICD-10-CM | POA: Diagnosis not present

## 2021-12-21 DIAGNOSIS — N1832 Chronic kidney disease, stage 3b: Secondary | ICD-10-CM | POA: Diagnosis not present

## 2021-12-21 DIAGNOSIS — I4430 Unspecified atrioventricular block: Secondary | ICD-10-CM | POA: Diagnosis not present

## 2021-12-21 DIAGNOSIS — E1165 Type 2 diabetes mellitus with hyperglycemia: Secondary | ICD-10-CM | POA: Diagnosis not present

## 2021-12-21 DIAGNOSIS — I2111 ST elevation (STEMI) myocardial infarction involving right coronary artery: Secondary | ICD-10-CM | POA: Diagnosis not present

## 2021-12-21 DIAGNOSIS — N179 Acute kidney failure, unspecified: Secondary | ICD-10-CM | POA: Diagnosis not present

## 2021-12-26 DIAGNOSIS — I130 Hypertensive heart and chronic kidney disease with heart failure and stage 1 through stage 4 chronic kidney disease, or unspecified chronic kidney disease: Secondary | ICD-10-CM | POA: Diagnosis not present

## 2021-12-26 DIAGNOSIS — I4430 Unspecified atrioventricular block: Secondary | ICD-10-CM | POA: Diagnosis not present

## 2021-12-26 DIAGNOSIS — I2510 Atherosclerotic heart disease of native coronary artery without angina pectoris: Secondary | ICD-10-CM | POA: Diagnosis not present

## 2021-12-26 DIAGNOSIS — N1832 Chronic kidney disease, stage 3b: Secondary | ICD-10-CM | POA: Diagnosis not present

## 2021-12-26 DIAGNOSIS — E1122 Type 2 diabetes mellitus with diabetic chronic kidney disease: Secondary | ICD-10-CM | POA: Diagnosis not present

## 2021-12-26 DIAGNOSIS — N179 Acute kidney failure, unspecified: Secondary | ICD-10-CM | POA: Diagnosis not present

## 2021-12-26 DIAGNOSIS — E1165 Type 2 diabetes mellitus with hyperglycemia: Secondary | ICD-10-CM | POA: Diagnosis not present

## 2021-12-26 DIAGNOSIS — I5033 Acute on chronic diastolic (congestive) heart failure: Secondary | ICD-10-CM | POA: Diagnosis not present

## 2021-12-26 DIAGNOSIS — G5600 Carpal tunnel syndrome, unspecified upper limb: Secondary | ICD-10-CM | POA: Diagnosis not present

## 2021-12-26 DIAGNOSIS — I2111 ST elevation (STEMI) myocardial infarction involving right coronary artery: Secondary | ICD-10-CM | POA: Diagnosis not present

## 2022-01-02 DIAGNOSIS — I130 Hypertensive heart and chronic kidney disease with heart failure and stage 1 through stage 4 chronic kidney disease, or unspecified chronic kidney disease: Secondary | ICD-10-CM | POA: Diagnosis not present

## 2022-01-02 DIAGNOSIS — E1122 Type 2 diabetes mellitus with diabetic chronic kidney disease: Secondary | ICD-10-CM | POA: Diagnosis not present

## 2022-01-02 DIAGNOSIS — I2111 ST elevation (STEMI) myocardial infarction involving right coronary artery: Secondary | ICD-10-CM | POA: Diagnosis not present

## 2022-01-02 DIAGNOSIS — N1832 Chronic kidney disease, stage 3b: Secondary | ICD-10-CM | POA: Diagnosis not present

## 2022-01-02 DIAGNOSIS — I4430 Unspecified atrioventricular block: Secondary | ICD-10-CM | POA: Diagnosis not present

## 2022-01-02 DIAGNOSIS — I5033 Acute on chronic diastolic (congestive) heart failure: Secondary | ICD-10-CM | POA: Diagnosis not present

## 2022-01-02 DIAGNOSIS — I2510 Atherosclerotic heart disease of native coronary artery without angina pectoris: Secondary | ICD-10-CM | POA: Diagnosis not present

## 2022-01-02 DIAGNOSIS — G5600 Carpal tunnel syndrome, unspecified upper limb: Secondary | ICD-10-CM | POA: Diagnosis not present

## 2022-01-02 DIAGNOSIS — E1165 Type 2 diabetes mellitus with hyperglycemia: Secondary | ICD-10-CM | POA: Diagnosis not present

## 2022-01-02 DIAGNOSIS — N179 Acute kidney failure, unspecified: Secondary | ICD-10-CM | POA: Diagnosis not present

## 2022-01-11 DIAGNOSIS — E1165 Type 2 diabetes mellitus with hyperglycemia: Secondary | ICD-10-CM | POA: Diagnosis not present

## 2022-01-11 DIAGNOSIS — E1122 Type 2 diabetes mellitus with diabetic chronic kidney disease: Secondary | ICD-10-CM | POA: Diagnosis not present

## 2022-01-11 DIAGNOSIS — I130 Hypertensive heart and chronic kidney disease with heart failure and stage 1 through stage 4 chronic kidney disease, or unspecified chronic kidney disease: Secondary | ICD-10-CM | POA: Diagnosis not present

## 2022-01-11 DIAGNOSIS — I4430 Unspecified atrioventricular block: Secondary | ICD-10-CM | POA: Diagnosis not present

## 2022-01-11 DIAGNOSIS — I5033 Acute on chronic diastolic (congestive) heart failure: Secondary | ICD-10-CM | POA: Diagnosis not present

## 2022-01-11 DIAGNOSIS — I2111 ST elevation (STEMI) myocardial infarction involving right coronary artery: Secondary | ICD-10-CM | POA: Diagnosis not present

## 2022-01-11 DIAGNOSIS — I2510 Atherosclerotic heart disease of native coronary artery without angina pectoris: Secondary | ICD-10-CM | POA: Diagnosis not present

## 2022-01-11 DIAGNOSIS — N1832 Chronic kidney disease, stage 3b: Secondary | ICD-10-CM | POA: Diagnosis not present

## 2022-01-11 DIAGNOSIS — G5600 Carpal tunnel syndrome, unspecified upper limb: Secondary | ICD-10-CM | POA: Diagnosis not present

## 2022-01-11 DIAGNOSIS — N179 Acute kidney failure, unspecified: Secondary | ICD-10-CM | POA: Diagnosis not present

## 2022-01-15 DIAGNOSIS — E1165 Type 2 diabetes mellitus with hyperglycemia: Secondary | ICD-10-CM | POA: Diagnosis not present

## 2022-01-15 DIAGNOSIS — I2510 Atherosclerotic heart disease of native coronary artery without angina pectoris: Secondary | ICD-10-CM | POA: Diagnosis not present

## 2022-01-15 DIAGNOSIS — I4430 Unspecified atrioventricular block: Secondary | ICD-10-CM | POA: Diagnosis not present

## 2022-01-15 DIAGNOSIS — I130 Hypertensive heart and chronic kidney disease with heart failure and stage 1 through stage 4 chronic kidney disease, or unspecified chronic kidney disease: Secondary | ICD-10-CM | POA: Diagnosis not present

## 2022-01-15 DIAGNOSIS — N179 Acute kidney failure, unspecified: Secondary | ICD-10-CM | POA: Diagnosis not present

## 2022-01-15 DIAGNOSIS — G5600 Carpal tunnel syndrome, unspecified upper limb: Secondary | ICD-10-CM | POA: Diagnosis not present

## 2022-01-15 DIAGNOSIS — N1832 Chronic kidney disease, stage 3b: Secondary | ICD-10-CM | POA: Diagnosis not present

## 2022-01-15 DIAGNOSIS — E1122 Type 2 diabetes mellitus with diabetic chronic kidney disease: Secondary | ICD-10-CM | POA: Diagnosis not present

## 2022-01-15 DIAGNOSIS — I2111 ST elevation (STEMI) myocardial infarction involving right coronary artery: Secondary | ICD-10-CM | POA: Diagnosis not present

## 2022-01-15 DIAGNOSIS — I5033 Acute on chronic diastolic (congestive) heart failure: Secondary | ICD-10-CM | POA: Diagnosis not present

## 2022-01-16 ENCOUNTER — Encounter: Payer: Self-pay | Admitting: Family Medicine

## 2022-01-16 DIAGNOSIS — D485 Neoplasm of uncertain behavior of skin: Secondary | ICD-10-CM | POA: Diagnosis not present

## 2022-01-16 DIAGNOSIS — C44329 Squamous cell carcinoma of skin of other parts of face: Secondary | ICD-10-CM | POA: Diagnosis not present

## 2022-01-16 DIAGNOSIS — L570 Actinic keratosis: Secondary | ICD-10-CM | POA: Diagnosis not present

## 2022-01-16 DIAGNOSIS — L821 Other seborrheic keratosis: Secondary | ICD-10-CM | POA: Diagnosis not present

## 2022-01-16 DIAGNOSIS — D0461 Carcinoma in situ of skin of right upper limb, including shoulder: Secondary | ICD-10-CM | POA: Diagnosis not present

## 2022-03-07 ENCOUNTER — Telehealth: Payer: Self-pay | Admitting: Family Medicine

## 2022-03-07 NOTE — Telephone Encounter (Signed)
Left message for patient to call back and schedule Medicare Annual Wellness Visit (AWV) either virtually or phone. ? ? ?Last AWV 09/14/20 ?please schedule at anytime with health coach ? ?I left my direct # [phone] ?

## 2022-03-08 ENCOUNTER — Ambulatory Visit (INDEPENDENT_AMBULATORY_CARE_PROVIDER_SITE_OTHER): Payer: Medicare HMO

## 2022-03-08 VITALS — Ht 63.0 in | Wt 186.0 lb

## 2022-03-08 DIAGNOSIS — Z0000 Encounter for general adult medical examination without abnormal findings: Secondary | ICD-10-CM

## 2022-03-08 NOTE — Patient Instructions (Addendum)
?Cathy Larsen , ?Thank you for taking time to come for your Medicare Wellness Visit. I appreciate your ongoing commitment to your health goals. Please review the following plan we discussed and let me know if I can assist you in the future.  ? ?These are the goals we discussed: ? Goals   ? ?   Tend to my garden (pt-stated)   ?   I want to tend to my garden  ?  ? ?  ?  ?This is a list of the screening recommended for you and due dates:  ?Health Maintenance  ?Topic Date Due  ? COVID-19 Vaccine (4 - Booster for Moderna series) 10/30/2020  ? Complete foot exam   09/14/2021  ? Zoster (Shingles) Vaccine (1 of 2) 06/08/2022*  ? Tetanus Vaccine  03/09/2023*  ? Hemoglobin A1C  05/10/2022  ? Eye exam for diabetics  05/11/2022  ? Flu Shot  06/04/2022  ? Pneumonia Vaccine  Completed  ? DEXA scan (bone density measurement)  Completed  ? HPV Vaccine  Aged Out  ?*Topic was postponed. The date shown is not the original due date.  ? ?Advanced directives: Yes Copies on file ? ?Conditions/risks identified: None ? ?Next appointment: Follow up in one year for your annual wellness visit   ? ? ?Preventive Care 43 Years and Older, Female ?Preventive care refers to lifestyle choices and visits with your health care provider that can promote health and wellness. ?What does preventive care include? ?A yearly physical exam. This is also called an annual well check. ?Dental exams once or twice a year. ?Routine eye exams. Ask your health care provider how often you should have your eyes checked. ?Personal lifestyle choices, including: ?Daily care of your teeth and gums. ?Regular physical activity. ?Eating a healthy diet. ?Avoiding tobacco and drug use. ?Limiting alcohol use. ?Practicing safe sex. ?Taking low-dose aspirin every day. ?Taking vitamin and mineral supplements as recommended by your health care provider. ?What happens during an annual well check? ?The services and screenings done by your health care provider during your annual well check  will depend on your age, overall health, lifestyle risk factors, and family history of disease. ?Counseling  ?Your health care provider may ask you questions about your: ?Alcohol use. ?Tobacco use. ?Drug use. ?Emotional well-being. ?Home and relationship well-being. ?Sexual activity. ?Eating habits. ?History of falls. ?Memory and ability to understand (cognition). ?Work and work Statistician. ?Reproductive health. ?Screening  ?You may have the following tests or measurements: ?Height, weight, and BMI. ?Blood pressure. ?Lipid and cholesterol levels. These may be checked every 5 years, or more frequently if you are over 15 years old. ?Skin check. ?Lung cancer screening. You may have this screening every year starting at age 29 if you have a 30-pack-year history of smoking and currently smoke or have quit within the past 15 years. ?Fecal occult blood test (FOBT) of the stool. You may have this test every year starting at age 32. ?Flexible sigmoidoscopy or colonoscopy. You may have a sigmoidoscopy every 5 years or a colonoscopy every 10 years starting at age 28. ?Hepatitis C blood test. ?Hepatitis B blood test. ?Sexually transmitted disease (STD) testing. ?Diabetes screening. This is done by checking your blood sugar (glucose) after you have not eaten for a while (fasting). You may have this done every 1-3 years. ?Bone density scan. This is done to screen for osteoporosis. You may have this done starting at age 38. ?Mammogram. This may be done every 1-2 years. Talk to your health  care provider about how often you should have regular mammograms. ?Talk with your health care provider about your test results, treatment options, and if necessary, the need for more tests. ?Vaccines  ?Your health care provider may recommend certain vaccines, such as: ?Influenza vaccine. This is recommended every year. ?Tetanus, diphtheria, and acellular pertussis (Tdap, Td) vaccine. You may need a Td booster every 10 years. ?Zoster vaccine. You  may need this after age 64. ?Pneumococcal 13-valent conjugate (PCV13) vaccine. One dose is recommended after age 53. ?Pneumococcal polysaccharide (PPSV23) vaccine. One dose is recommended after age 51. ?Talk to your health care provider about which screenings and vaccines you need and how often you need them. ?This information is not intended to replace advice given to you by your health care provider. Make sure you discuss any questions you have with your health care provider. ?Document Released: 11/17/2015 Document Revised: 07/10/2016 Document Reviewed: 08/22/2015 ?Elsevier Interactive Patient Education ? 2017 [place]. ? ?Fall Prevention in the Home ?Falls can cause injuries. They can happen to people of all ages. There are many things you can do to make your home safe and to help prevent falls. ?What can I do on the outside of my home? ?Regularly fix the edges of walkways and driveways and fix any cracks. ?Remove anything that might make you trip as you walk through a door, such as a raised step or threshold. ?Trim any bushes or trees on the path to your home. ?Use bright outdoor lighting. ?Clear any walking paths of anything that might make someone trip, such as rocks or tools. ?Regularly check to see if handrails are loose or broken. Make sure that both sides of any steps have handrails. ?Any raised decks and porches should have guardrails on the edges. ?Have any leaves, snow, or ice cleared regularly. ?Use sand or salt on walking paths during winter. ?Clean up any spills in your garage right away. This includes oil or grease spills. ?What can I do in the bathroom? ?Use night lights. ?Install grab bars by the toilet and in the tub and shower. Do not use towel bars as grab bars. ?Use non-skid mats or decals in the tub or shower. ?If you need to sit down in the shower, use a plastic, non-slip stool. ?Keep the floor dry. Clean up any water that spills on the floor as soon as it happens. ?Remove soap buildup  in the tub or shower regularly. ?Attach bath mats securely with double-sided non-slip rug tape. ?Do not have throw rugs and other things on the floor that can make you trip. ?What can I do in the bedroom? ?Use night lights. ?Make sure that you have a light by your bed that is easy to reach. ?Do not use any sheets or blankets that are too big for your bed. They should not hang down onto the floor. ?Have a firm chair that has side arms. You can use this for support while you get dressed. ?Do not have throw rugs and other things on the floor that can make you trip. ?What can I do in the kitchen? ?Clean up any spills right away. ?Avoid walking on wet floors. ?Keep items that you use a lot in easy-to-reach places. ?If you need to reach something above you, use a strong step stool that has a grab bar. ?Keep electrical cords out of the way. ?Do not use floor polish or wax that makes floors slippery. If you must use wax, use non-skid floor wax. ?Do not have throw  rugs and other things on the floor that can make you trip. ?What can I do with my stairs? ?Do not leave any items on the stairs. ?Make sure that there are handrails on both sides of the stairs and use them. Fix handrails that are broken or loose. Make sure that handrails are as long as the stairways. ?Check any carpeting to make sure that it is firmly attached to the stairs. Fix any carpet that is loose or worn. ?Avoid having throw rugs at the top or bottom of the stairs. If you do have throw rugs, attach them to the floor with carpet tape. ?Make sure that you have a light switch at the top of the stairs and the bottom of the stairs. If you do not have them, ask someone to add them for you. ?What else can I do to help prevent falls? ?Wear shoes that: ?Do not have high heels. ?Have rubber bottoms. ?Are comfortable and fit you well. ?Are closed at the toe. Do not wear sandals. ?If you use a stepladder: ?Make sure that it is fully opened. Do not climb a closed  stepladder. ?Make sure that both sides of the stepladder are locked into place. ?Ask someone to hold it for you, if possible. ?Clearly mark and make sure that you can see: ?Any grab bars or handrails. ?[place]

## 2022-03-08 NOTE — Progress Notes (Signed)
? ?Subjective:  ? Cathy Larsen is a 88 y.o. female who presents for Medicare Annual (Subsequent) preventive examination. ? ?Review of Systems    ?Virtual Visit via Telephone Note ? ?I connected with  Cathy Larsen on 03/08/22 at  1:00 PM EDT by telephone and verified that I am speaking with the correct person using two identifiers. ? ?Location: ?Patient: Home ?Provider: Office ?Persons participating in the virtual visit: patient/Nurse Health Advisor ?  ?I discussed the limitations, risks, security and privacy concerns of performing an evaluation and management service by telephone and the availability of in person appointments. The patient expressed understanding and agreed to proceed. ? ?Interactive audio and video telecommunications were attempted between this nurse and patient, however failed, due to patient having technical difficulties OR patient did not have access to video capability.  We continued and completed visit with audio only. ? ?Some vital signs may be absent or patient reported.  ? ?Cathy Peaches, LPN  ?Cardiac Risk Factors include: advanced age (>40mn, >>5women);hypertension;diabetes mellitus ? ?   ?Objective:  ?  ?Today's Vitals  ? 03/08/22 1304  ?Weight: 186 lb (84.4 kg)  ?Height: '5\' 3"'$  (1.6 m)  ? ?Body mass index is 32.95 kg/m?. ? ? ?  03/08/2022  ?  1:14 PM 11/16/2021  ? 10:49 AM 11/10/2021  ?  5:10 PM 05/20/2021  ?  4:31 PM 09/04/2018  ?  9:24 AM 09/01/2017  ?  9:10 AM 08/29/2016  ?  9:32 AM  ?Advanced Directives  ?Does Patient Have a Medical Advance Directive? Yes No No No Yes Yes Yes  ?Type of [place]aramed[place]of [place] PointLiving will    [place]Living will [place]Living will [place]Living will  ?Does patient want to make changes to medical advance directive? No - Patient declined      No - Patient declined  ?Copy of HW[place] Chart? No - copy requested    No - copy requested Yes Yes   ?Would patient like information on creating a medical advance directive?  No - Patient declined No - Patient declined No - Patient declined     ? ? ?Current Medications (verified) ?Outpatient Encounter Medications as of 03/08/2022  ?Medication Sig  ? aspirin EC 81 MG tablet Take 1 tablet (81 mg total) by mouth daily. Swallow whole.  ? Carboxymethylcellulose Sodium (THERATEARS) 0.25 % SOLN Place 1 drop into both eyes at bedtime.  ? clopidogrel (PLAVIX) 75 MG tablet Take 1 tablet (75 mg total) by mouth daily.  ? diclofenac Sodium (VOLTAREN) 1 % GEL Apply 4 g topically 4 (four) times daily as needed (feet pain).  ? empagliflozin (JARDIANCE) 10 MG TABS tablet Take 1 tablet (10 mg total) by mouth daily.  ? ezetimibe (ZETIA) 10 MG tablet Take 1 tablet (10 mg total) by mouth daily.  ? famotidine (PEPCID) 10 MG tablet Take 10 mg by mouth daily as needed for heartburn or indigestion.  ? furosemide (LASIX) 20 MG tablet Take 1 tablet (20 mg total) by mouth as needed (For 3 pound weight gain in 24 hours or 5 pound weight gain in 1 week.).  ? Ginger, Zingiber officinalis, (GINGER ROOT) 500 MG CAPS Take 500 mg by mouth at bedtime.  ? glucose blood (ONE TOUCH ULTRA TEST) test strip USE DAILY TO CHECK SUGAR.  Dx 11.9.  ? losartan (COZAAR) 50 MG tablet Take 1 tablet (50 mg total) by mouth daily.  ?  Magnesium 200 MG TABS Take 200 mg by mouth at bedtime.  ? Multiple Vitamin (MULTIVITAMIN WITH MINERALS) TABS Take 1 tablet by mouth at bedtime.  ? nitroGLYCERIN (NITROSTAT) 0.4 MG SL tablet Place 1 tablet (0.4 mg total) under the tongue every 5 (five) minutes as needed for chest pain (max 3 tabs in 15 minutes).  ? Omega 3-6-9 Fatty Acids (OMEGA-3-6-9 PO) Take 2,400 mg by mouth 3 (three) times a week. liquid  ? Omega-3 Fatty Acids (FISH OIL) 1200 MG CAPS Take 1,200 mg by mouth daily.  ? rosuvastatin (CRESTOR) 10 MG tablet Take 1 tablet (10 mg total) by mouth daily.  ? Turmeric 500 MG CAPS Take 500 mg by mouth at bedtime.  ? ?No  facility-administered encounter medications on file as of 03/08/2022.  ? ? ?Allergies (verified) ?Statins, Beta adrenergic blockers, Keflex [cephalexin], and Requip [ropinirole hcl]  ? ?History: ?Past Medical History:  ?Diagnosis Date  ? Arthritis   ? feet and hands  ? CAD (coronary artery disease)   ? cath 1998 with 40% lesion in LAD but no other disease  ? Carpal tunnel syndrome   ? Diabetes mellitus   ? Gout 03/1998  ? Hyperlipidemia 08/1997  ? Hypertension 09/23/1995  ? Skin cancer   ? ?Past Surgical History:  ?Procedure Laterality Date  ? BIOPSY BREAST  03/2000  ? Ductogram right breast w/ biopsy- obstructing lesion benign by path. (Dr. Annamaria Boots)   ? BLADDER SURGERY    ? bladder tack   ? CARDIAC CATHETERIZATION  03/1997  ? 40% occulsion lad   ? CARPAL TUNNEL RELEASE Right 03/14/2014  ? Procedure: RIGHT CARPAL TUNNEL RELEASE;  Surgeon: Tennis Must, MD;  Location: [facility];  Service: Orthopedics;  Laterality: Right;  ? CATARACT EXTRACTION    ? CORONARY/GRAFT ACUTE MI REVASCULARIZATION N/A 11/10/2021  ? Procedure: Coronary/Graft Acute MI Revascularization;  Surgeon: Troy Sine, MD;  Location: [facility];  Service: Cardiovascular;  Laterality: N/A;  ? LEFT HEART CATH AND CORONARY ANGIOGRAPHY N/A 11/10/2021  ? Procedure: LEFT HEART CATH AND CORONARY ANGIOGRAPHY;  Surgeon: Troy Sine, MD;  Location: [facility];  Service: Cardiovascular;  Laterality: N/A;  ? PARTIAL HYSTERECTOMY    ? VAGINAL DELIVERY    ? x 3  ? YAG LASER APPLICATION Right 01/04/4400  ? Procedure: YAG LASER APPLICATION;  Surgeon: Williams Che, MD;  Location: [facility];  Service: Ophthalmology;  Laterality: Right;  ? ?Family History  ?Problem Relation Age of Onset  ? Cancer Mother   ?     breast   ? Kidney failure Mother   ? Cancer Father   ?     colon   ? Cancer Brother   ?     bladder   ? Cancer Brother   ?     lung  ? Hypertension Sister   ? Hypertension Sister   ? Diabetes Neg Hx   ? Stroke Neg Hx   ? ?Social History   ? ?Socioeconomic History  ? Marital status: Married  ?  Spouse name: Not on file  ? Number of children: 3  ? Years of education: Not on file  ? Highest education level: Not on file  ?Occupational History  ? Occupation: [employer]   ?Tobacco Use  ? Smoking status: Never  ? Smokeless tobacco: Never  ?Vaping Use  ? Vaping Use: Never used  ?Substance and Sexual Activity  ? Alcohol use: No  ?  Alcohol/week: 0.0 standard  drinks  ? Drug use: No  ? Sexual activity: Not Currently  ?Other Topics Concern  ? Not on file  ?Social History Narrative  ? Widowed 10/2012, husband had CVA and dementia  ? Grandson lives with patient as of 2018  ? ?Social Determinants of Health  ? ?Financial Resource Strain: Low Risk   ? Difficulty of Paying Living Expenses: Not hard at all  ?Food Insecurity: No Food Insecurity  ? Worried About Charity fundraiser in the Last Year: Never true  ? Ran Out of Food in the Last Year: Never true  ?Transportation Needs: No Transportation Needs  ? Lack of Transportation (Medical): No  ? Lack of Transportation (Non-Medical): No  ?Physical Activity: Insufficiently Active  ? Days of Exercise per Week: 5 days  ? Minutes of Exercise per Session: 20 min  ?Stress: No Stress Concern Present  ? Feeling of Stress : Not at all  ?Social Connections: Moderately Integrated  ? Frequency of Communication with Friends and Family: More than three times a week  ? Frequency of Social Gatherings with Friends and Family: More than three times a week  ? Attends Religious Services: More than 4 times per year  ? Active Member of Clubs or Organizations: Yes  ? Attends Archivist Meetings: More than 4 times per year  ? Marital Status: Widowed  ? ? ? ?Clinical Intake: ? ?Pre-visit preparation completed: NoNutrition Risk Assessment: ? ?Has the patient had any N/V/D within the last 2 months?  No  ?Does the patient have any non-healing wounds?  No  ?Has the patient had any unintentional weight loss or weight gain?  No   ? ?Diabetes: ? ?Is the patient diabetic?  Yes  ?If diabetic, was a CBG obtained today?  No  ?Did the patient bring in their glucometer from home?  No  ?How often do you monitor your CBG's? PRN.  ? ?Financial Strains and Diabetes Ma

## 2022-03-11 ENCOUNTER — Ambulatory Visit (INDEPENDENT_AMBULATORY_CARE_PROVIDER_SITE_OTHER): Payer: Medicare HMO | Admitting: Family Medicine

## 2022-03-11 ENCOUNTER — Encounter: Payer: Self-pay | Admitting: Family Medicine

## 2022-03-11 VITALS — BP 140/82 | HR 73 | Temp 97.4°F | Ht 63.0 in | Wt 184.0 lb

## 2022-03-11 DIAGNOSIS — L0390 Cellulitis, unspecified: Secondary | ICD-10-CM | POA: Diagnosis not present

## 2022-03-11 DIAGNOSIS — D649 Anemia, unspecified: Secondary | ICD-10-CM

## 2022-03-11 DIAGNOSIS — E611 Iron deficiency: Secondary | ICD-10-CM

## 2022-03-11 DIAGNOSIS — E119 Type 2 diabetes mellitus without complications: Secondary | ICD-10-CM

## 2022-03-11 MED ORDER — DOXYCYCLINE HYCLATE 100 MG PO TABS: 100.0000 mg | ORAL_TABLET | Freq: Two times a day (BID) | ORAL | 0 refills | Status: DC

## 2022-03-11 NOTE — Patient Instructions (Signed)
Go to the lab on the way out.   If you have mychart we'll likely use that to update you.    ?Take care.  Glad to see you. ?Don't change your meds yet.  If you have more abdominal pain then let me know.  See if you can identify any triggers.  ?

## 2022-03-11 NOTE — Progress Notes (Signed)
Diabetes:  ?Using medications without difficulties: yes ?Hypoglycemic episodes: no sx ?Hyperglycemic episodes: no sx ?Feet problems: no tingling.  Some occ L ankle edema.  1 episode of burning in the feet that resolved.   ?Blood Sugars averaging: not check often ?eye exam within last year: yes ? ?Weight stable.  Her memory is better per family report, in the last few months, ie better now than as inpatient in 11/2021.  She has some short term lapses at baseline.  Yolanda Bonine is helping her with her meds at home.   ? ?H/o low iron.  Has been taking iron MWF and swallowing/cough changes resolved with taking famotidine.   ? ?Tick bite on R side of back. Locally irritated but no fevers.  Tick has been removed.  See exam.   ? ?She has occ LUQ abd discomfort.  No blood in stool.  No change in BMs.  No clear pattern to sx.  Not daily.   ? ?PMH and SH reviewed ? ?Meds, vitals, and allergies reviewed.  ? ?ROS: Per HPI unless specifically indicated in ROS section  ? ?GEN: nad, alert and oriented ?HEENT: ncat ?NECK: supple w/o LA ?CV: rrr. ?PULM: ctab, no inc wob ?ABD: soft, +bs ?EXT: trace BLE edema ?SKIN: 8x4 cm irritated blanching patch on the R upper back at tick bite site.  No ulceration or fluctuant mass.   ? ?Diabetic foot exam: ?Normal inspection ?No skin breakdown ?No calluses  ?Normal DP pulses ?Normal sensation to light touch but dec to monofilament ?Nails thickened but not long  ?

## 2022-03-12 LAB — CBC WITH DIFFERENTIAL/PLATELET
Basophils Absolute: 0.1 10*3/uL (ref 0.0–0.1)
Basophils Relative: 1.1 % (ref 0.0–3.0)
Eosinophils Absolute: 0.4 10*3/uL (ref 0.0–0.7)
Eosinophils Relative: 4.7 % (ref 0.0–5.0)
HCT: 40.3 % (ref 36.0–46.0)
Hemoglobin: 13.3 g/dL (ref 12.0–15.0)
Lymphocytes Relative: 21.3 % (ref 12.0–46.0)
Lymphs Abs: 1.8 10*3/uL (ref 0.7–4.0)
MCHC: 32.9 g/dL (ref 30.0–36.0)
MCV: 87.2 fl (ref 78.0–100.0)
Monocytes Absolute: 0.9 10*3/uL (ref 0.1–1.0)
Monocytes Relative: 10.4 % (ref 3.0–12.0)
Neutro Abs: 5.3 10*3/uL (ref 1.4–7.7)
Neutrophils Relative %: 62.5 % (ref 43.0–77.0)
Platelets: 307 10*3/uL (ref 150.0–400.0)
RBC: 4.62 Mil/uL (ref 3.87–5.11)
RDW: 14.2 % (ref 11.5–15.5)
WBC: 8.5 10*3/uL (ref 4.0–10.5)

## 2022-03-12 LAB — LIPID PANEL
Cholesterol: 134 mg/dL (ref 0–200)
HDL: 34.7 mg/dL — ABNORMAL LOW (ref >39.00)
NonHDL: 99.18
Total CHOL/HDL Ratio: 4
Triglycerides: 236 mg/dL — ABNORMAL HIGH (ref 0.0–149.0)
VLDL: 47.2 mg/dL — ABNORMAL HIGH (ref 0.0–40.0)

## 2022-03-12 LAB — HEMOGLOBIN A1C: Hgb A1c MFr Bld: 8.5 % — ABNORMAL HIGH (ref 4.6–6.5)

## 2022-03-12 LAB — COMPREHENSIVE METABOLIC PANEL
ALT: 12 U/L (ref 0–35)
AST: 16 U/L (ref 0–37)
Albumin: 3.9 g/dL (ref 3.5–5.2)
Alkaline Phosphatase: 65 U/L (ref 39–117)
BUN: 14 mg/dL (ref 6–23)
CO2: 31 mEq/L (ref 19–32)
Calcium: 9.1 mg/dL (ref 8.4–10.5)
Chloride: 102 mEq/L (ref 96–112)
Creatinine, Ser: 1.35 mg/dL — ABNORMAL HIGH (ref 0.40–1.20)
GFR: 33.84 mL/min — ABNORMAL LOW (ref >60.00)
Glucose, Bld: 155 mg/dL — ABNORMAL HIGH (ref 70–99)
Potassium: 4.3 mEq/L (ref 3.5–5.1)
Sodium: 139 mEq/L (ref 135–145)
Total Bilirubin: 0.3 mg/dL (ref 0.2–1.2)
Total Protein: 6.7 g/dL (ref 6.0–8.3)

## 2022-03-12 LAB — LDL CHOLESTEROL, DIRECT: Direct LDL: 78 mg/dL

## 2022-03-12 LAB — IRON: Iron: 26 ug/dL — ABNORMAL LOW (ref 42–145)

## 2022-03-13 ENCOUNTER — Other Ambulatory Visit: Payer: Self-pay | Admitting: Family Medicine

## 2022-03-13 DIAGNOSIS — E119 Type 2 diabetes mellitus without complications: Secondary | ICD-10-CM

## 2022-03-13 DIAGNOSIS — E611 Iron deficiency: Secondary | ICD-10-CM

## 2022-03-13 DIAGNOSIS — L0390 Cellulitis, unspecified: Secondary | ICD-10-CM | POA: Insufficient documentation

## 2022-03-13 MED ORDER — FERROUS SULFATE 325 (65 FE) MG PO TABS: ORAL_TABLET | ORAL | Status: DC

## 2022-03-13 NOTE — Assessment & Plan Note (Signed)
Would treat for cellulitis with doxy.  Should she have a tick relate issue, this should also cover.  Okay for outpatient f/u.  Pt and grandson can monitor.  Update me as needed.   ?

## 2022-03-13 NOTE — Assessment & Plan Note (Signed)
See notes on labs.  Continue jardiance.   ?

## 2022-03-13 NOTE — Assessment & Plan Note (Signed)
H/o, recheck labs pending, has been taking iron MWF and swallowing issues resolved with famotidine.  D/w pt about monitoring LUQ sx, benign exam today.   ?

## 2022-04-17 DIAGNOSIS — L720 Epidermal cyst: Secondary | ICD-10-CM | POA: Diagnosis not present

## 2022-04-17 DIAGNOSIS — C44329 Squamous cell carcinoma of skin of other parts of face: Secondary | ICD-10-CM | POA: Diagnosis not present

## 2022-04-17 DIAGNOSIS — C44622 Squamous cell carcinoma of skin of right upper limb, including shoulder: Secondary | ICD-10-CM | POA: Diagnosis not present

## 2022-04-17 DIAGNOSIS — D485 Neoplasm of uncertain behavior of skin: Secondary | ICD-10-CM | POA: Diagnosis not present

## 2022-04-29 NOTE — Progress Notes (Signed)
Cardiology Office Note:    Date:  6/27/Lars[place]   ID:  [name], DOB 19-Sep-1926, MRN [id number]  PCP:  Joaquim Nam, MD  Cardiologist:  Dannon Perlow   Electrophysiologist:  None   Referring MD: Joaquim Nam, MD   No chief complaint on file.   Problem List 1.   Chest pain  2. Heart murmur 3. Hyperlipidemia  4.  Essential HTN   History of Present Illness:    Cathy Larsen is a 88 y.o. female with a hx of chest disfomfort.  We are asked to see her today by Dr. Para March for further evaluation of these chest pains  Seen with her grandson, Cathy Larsen Previous patient of Dr. Daleen Squibb  She woke up with an episode of cp in the middle of the night.  Was not very severe.   Had some pain in her arms. Lasted for several hours  Had been doing some yard work ( picking up sticks) the day before  Had persistent left sided chest pain the following day   Had an episode of syncope many years ago .  Had a heart cath  Has a hx of hyperlipidemia .  Was on a statin but it caused severe muscle aches  Has hx of HTN.   Still eating salty foods.  Eats prepared meals very often .   Frozen pot pies.   Cathy Larsen:  [name] is seen today for follow-up visit.  She has had a history of chest pain.  She has been somewhat frail and we have not been eager to pursue ischemic work-up unless she has significant problems.  We have decided to treat her conservatively and medically  Seen with grandson , Tim. No cp Still eating some salt   Past Medical History:  Diagnosis Date   Arthritis    feet and hands   CAD (coronary artery disease)    cath 1998 with 40% lesion in LAD but no other disease   Carpal tunnel syndrome    Diabetes mellitus    Gout 03/1998   Hyperlipidemia 08/1997   Hypertension 09/23/1995   Skin cancer     Past Surgical History:  Procedure Laterality Date   BIOPSY BREAST  03/2000   Ductogram right breast w/ biopsy- obstructing lesion benign by path. (Dr. Maple Hudson)    BLADDER SURGERY      bladder tack    CARDIAC CATHETERIZATION  03/1997   40% occulsion lad    CARPAL TUNNEL RELEASE Right 03/14/2014   Procedure: RIGHT CARPAL TUNNEL RELEASE;  Surgeon: Tami Ribas, MD;  Location: [facility];  Service: Orthopedics;  Laterality: Right;   CATARACT EXTRACTION     CORONARY/GRAFT ACUTE MI REVASCULARIZATION N/A 1/7/[place]   Procedure: Coronary/Graft Acute MI Revascularization;  Surgeon: Lennette Bihari, MD;  Location: [facility] INVASIVE CV LAB;  Service: Cardiovascular;  Laterality: N/A;   LEFT HEART CATH AND CORONARY ANGIOGRAPHY N/A 1/7/[place]   Procedure: LEFT HEART CATH AND CORONARY ANGIOGRAPHY;  Surgeon: Lennette Bihari, MD;  Location: [facility] INVASIVE CV LAB;  Service: Cardiovascular;  Laterality: N/A;   PARTIAL HYSTERECTOMY     VAGINAL DELIVERY     x 3   YAG LASER APPLICATION Right 04/04/2014   Procedure: YAG LASER APPLICATION;  Surgeon: Susa Simmonds, MD;  Location: [facility];  Service: Ophthalmology;  Laterality: Right;    Current Medications: Current Meds  Medication Sig   aspirin EC 81 MG tablet Take 1 tablet (81 mg total) by mouth daily.  Swallow whole.   Carboxymethylcellulose Sodium (THERATEARS) 0.25 % SOLN Place 1 drop into both eyes at bedtime.   clopidogrel (PLAVIX) 75 MG tablet Take 1 tablet (75 mg total) by mouth daily.   diclofenac Sodium (VOLTAREN) 1 % GEL Apply 4 g topically 4 (four) times daily as needed (feet pain).   empagliflozin (JARDIANCE) 10 MG TABS tablet Take 1 tablet (10 mg total) by mouth daily.   ezetimibe (ZETIA) 10 MG tablet Take 1 tablet (10 mg total) by mouth daily.   famotidine (PEPCID) 10 MG tablet Take 10 mg by mouth daily as needed for heartburn or indigestion.   ferrous sulfate 325 (65 FE) MG tablet 1 tab by mouth 4-5 times per week.   furosemide (LASIX) 20 MG tablet Take 1 tablet (20 mg total) by mouth as needed (For 3 pound weight gain in 24 hours or 5 pound weight gain in 1 week.).   Ginger, Zingiber officinalis, (GINGER ROOT) 500 MG CAPS  Take 500 mg by mouth at bedtime.   glucose blood (ONE TOUCH ULTRA TEST) test strip USE DAILY TO CHECK SUGAR.  Dx 11.9.   hydrochlorothiazide (HYDRODIURIL) 25 MG tablet Take 1 tablet (25 mg total) by mouth daily.   losartan (COZAAR) 50 MG tablet Take 1 tablet (50 mg total) by mouth daily.   Magnesium 200 MG TABS Take 200 mg by mouth at bedtime.   Multiple Vitamin (MULTIVITAMIN WITH MINERALS) TABS Take 1 tablet by mouth at bedtime.   nitroGLYCERIN (NITROSTAT) 0.4 MG SL tablet Place 1 tablet (0.4 mg total) under the tongue every 5 (five) minutes as needed for chest pain (max 3 tabs in 15 minutes).   Omega-3 Fatty Acids (FISH OIL) 1200 MG CAPS Take 1,200 mg by mouth daily.   potassium chloride (KLOR-CON) 10 MEQ tablet Take 1 tablet (10 mEq total) by mouth daily.   rosuvastatin (CRESTOR) 10 MG tablet Take 1 tablet (10 mg total) by mouth daily.   Turmeric 500 MG CAPS Take 500 mg by mouth at bedtime.     Allergies:   Statins, Beta adrenergic blockers, Keflex [cephalexin], and Requip [ropinirole hcl]   Social History   Socioeconomic History   Marital status: Married    Spouse name: Not on file   Number of children: 3   Years of education: Not on file   Highest education level: Not on file  Occupational History   Occupation: Farms   Tobacco Use   Smoking status: Never   Smokeless tobacco: Never  Vaping Use   Vaping Use: Never used  Substance and Sexual Activity   Alcohol use: No    Alcohol/week: 0.0 standard drinks of alcohol   Drug use: No   Sexual activity: Not Currently  Other Topics Concern   Not on file  Social History Narrative   Widowed 10/2012, husband had CVA and dementia   Grandson lives with patient as of 2018   Social Determinants of Health   Financial Resource Strain: Low Risk  (5/5/[place])   Overall Financial Resource Strain (CARDIA)    Difficulty of Paying Living Expenses: Not hard at all  Food Insecurity: No Food Insecurity (5/5/[place])   Hunger Vital Sign     Worried About Running Out of Food in the Last Year: Never true    Ran Out of Food in the Last Year: Never true  Transportation Needs: No Transportation Needs (5/5/[place])   PRAPARE - Administrator, Civil Service (Medical): No    Lack of Transportation (Non-Medical): No  Physical Activity: Insufficiently Active (5/5/[place])   Exercise Vital Sign    Days of Exercise per Week: 5 days    Minutes of Exercise per Session: 20 min  Stress: No Stress Concern Present (5/5/[place])   [employer] of Occupational Health - Occupational Stress Questionnaire    Feeling of Stress : Not at all  Social Connections: Moderately Integrated (5/5/[place])   Social Connection and Isolation Panel [NHANES]    Frequency of Communication with Friends and Family: More than three times a week    Frequency of Social Gatherings with Friends and Family: More than three times a week    Attends Religious Services: More than 4 times per year    Active Member of [employer] or Organizations: Yes    Attends Banker Meetings: More than 4 times per year    Marital Status: Widowed     Family History: The patient's family history includes Cancer in her brother, brother, father, and mother; Hypertension in her sister and sister; Kidney failure in her mother. There is no history of Diabetes or Stroke.  ROS:   Please see the history of present illness.     All other systems reviewed and are negative.  EKGs/Labs/Other Studies Reviewed:    The following studies were reviewed today:   EKG:      Recent Labs: 1/13/Larsen: B Natriuretic Peptide 1,161.8; TSH 3.031 5/8/Larsen: ALT 12; BUN 14; Creatinine, Ser 1.35; Hemoglobin 13.3; Platelets 307.0; Potassium 4.3; Sodium 139  Recent Lipid Panel    Component Value Date/Time   CHOL 134 05/08/Larsen 1444   TRIG 236.0 (H) 05/08/Larsen 1444   HDL 34.70 (L) 05/08/Larsen 1444   CHOLHDL 4 05/08/Larsen 1444   VLDL 47.2 (H) 05/08/L[place]en 1444   LDLCALC 136 (H) 01/08/L[place]en 1831    LDLDIRECT 78.0 05/08/Larsen 1444    Physical Exam:    Physical Exam: Blood pressure (!) 159/94, pulse 80, height 5\' 3"  (1.6 m), weight 185 lb 6.4 oz (84.1 kg), SpO2 97 %.  GEN:  Well nourished, well developed in no acute distress HEENT: Normal NECK: No JVD; No carotid bruits LYMPHATICS: No lymphadenopathy CARDIAC: RRR , no murmurs, rubs, gallops RESPIRATORY:  Clear to auscultation without rales, wheezing or rhonchi  ABDOMEN: Soft, non-tender, non-distended MUSCULOSKELETAL:  1-2 + pitting edema .  No deformity  SKIN: Warm and dry NEUROLOGIC:  Alert and oriented x 3   ASSESSMENT:    1. Coronary artery disease involving native coronary artery of native heart without angina pectoris   2. Essential hypertension   3. Chronic heart failure with preserved ejection fraction (HFpEF) (HCC)    PLAN:      Chest discomfort:  no further episodes of CP   2.  Hypertension:  Still eats lots of salt.  She is only taking the lasix PRN ( and never takes it) Will started HCTZ 25 mg a day and Kdur 10 meq a day  BMP in 3 weeks    3.  Hyperlipidemia:  stable   Medication Adjustments/Labs and Tests Ordered: Current medicines are reviewed at length with the patient today.  Concerns regarding medicines are outlined above.  Orders Placed This Encounter  Procedures   Basic metabolic panel   Meds ordered this encounter  Medications   hydrochlorothiazide (HYDRODIURIL) 25 MG tablet    Sig: Take 1 tablet (25 mg total) by mouth daily.    Dispense:  90 tablet    Refill:  3   potassium chloride (KLOR-CON) 10 MEQ tablet    Sig:  Take 1 tablet (10 mEq total) by mouth daily.    Dispense:  90 tablet    Refill:  3    Patient Instructions  Medication Instructions:  START Hydrochlorothiazide (HCTZ) 25mg  daily START Potassium daily TAKE Furosemide AS NEEDED *If you need a refill on your cardiac medications before your next appointment, please call your pharmacy*   Lab Work: BMET in 3  weeks If you have labs (blood work) drawn today and your tests are completely normal, you will receive your results only by: MyChart Message (if you have MyChart) OR A paper copy in the mail If you have any lab test that is abnormal or we need to change your treatment, we will call you to review the results.   Testing/Procedures: NONE   Follow-Up: At [facility], you and your health needs are our priority.  As part of our continuing mission to provide you with exceptional heart care, we have created designated Provider Care Teams.  These Care Teams include your primary Cardiologist (physician) and Advanced Practice Providers (APPs -  Physician Assistants and Nurse Practitioners) who all work together to provide you with the care you need, when you need it.  Your next appointment:   1 year(s)  The format for your next appointment:   In Person  Provider:   Athea Haley or Swinyer {  Important Information About Sugar         Signed, Kristeen Miss, MD  6/27/Larsen 11:22 AM    [facility]

## 2022-04-30 ENCOUNTER — Encounter: Payer: Self-pay | Admitting: Cardiovascular Disease

## 2022-04-30 ENCOUNTER — Ambulatory Visit: Payer: Medicare HMO | Admitting: Cardiovascular Disease

## 2022-04-30 VITALS — BP 159/94 | HR 80 | Ht 63.0 in | Wt 185.4 lb

## 2022-04-30 DIAGNOSIS — I5032 Chronic diastolic (congestive) heart failure: Secondary | ICD-10-CM | POA: Diagnosis not present

## 2022-04-30 DIAGNOSIS — I2510 Atherosclerotic heart disease of native coronary artery without angina pectoris: Secondary | ICD-10-CM | POA: Diagnosis not present

## 2022-04-30 DIAGNOSIS — I10 Essential (primary) hypertension: Secondary | ICD-10-CM | POA: Diagnosis not present

## 2022-04-30 MED ORDER — HYDROCHLOROTHIAZIDE 25 MG PO TABS: 25.0000 mg | ORAL_TABLET | Freq: Every day | ORAL | 3 refills | Status: DC

## 2022-04-30 MED ORDER — POTASSIUM CHLORIDE ER 10 MEQ PO TBCR: 10.0000 meq | EXTENDED_RELEASE_TABLET | Freq: Every day | ORAL | 3 refills | Status: DC

## 2022-05-21 ENCOUNTER — Other Ambulatory Visit: Payer: Medicare HMO

## 2022-05-21 DIAGNOSIS — I2510 Atherosclerotic heart disease of native coronary artery without angina pectoris: Secondary | ICD-10-CM

## 2022-05-21 DIAGNOSIS — I5032 Chronic diastolic (congestive) heart failure: Secondary | ICD-10-CM

## 2022-05-21 DIAGNOSIS — I10 Essential (primary) hypertension: Secondary | ICD-10-CM | POA: Diagnosis not present

## 2022-05-22 LAB — BASIC METABOLIC PANEL
BUN/Creatinine Ratio: 14 (ref 12–28)
BUN: 19 mg/dL (ref 10–36)
CO2: 26 mmol/L (ref 20–29)
Calcium: 9.7 mg/dL (ref 8.7–10.3)
Chloride: 96 mmol/L (ref 96–106)
Creatinine, Ser: 1.32 mg/dL — ABNORMAL HIGH (ref 0.57–1.00)
Glucose: 244 mg/dL — ABNORMAL HIGH (ref 70–99)
Potassium: 4.6 mmol/L (ref 3.5–5.2)
Sodium: 138 mmol/L (ref 134–144)
eGFR: 38 mL/min/{1.73_m2} — ABNORMAL LOW (ref >59)

## 2022-06-13 ENCOUNTER — Other Ambulatory Visit: Payer: Medicare HMO

## 2022-06-17 ENCOUNTER — Encounter: Payer: Self-pay | Admitting: Family Medicine

## 2022-06-17 ENCOUNTER — Ambulatory Visit (INDEPENDENT_AMBULATORY_CARE_PROVIDER_SITE_OTHER): Payer: Medicare HMO | Admitting: Family Medicine

## 2022-06-17 DIAGNOSIS — R238 Other skin changes: Secondary | ICD-10-CM

## 2022-06-17 DIAGNOSIS — E611 Iron deficiency: Secondary | ICD-10-CM | POA: Diagnosis not present

## 2022-06-17 DIAGNOSIS — D649 Anemia, unspecified: Secondary | ICD-10-CM

## 2022-06-17 DIAGNOSIS — E119 Type 2 diabetes mellitus without complications: Secondary | ICD-10-CM

## 2022-06-17 MED ORDER — FERROUS SULFATE 325 (65 FE) MG PO TABS: 325.0000 mg | ORAL_TABLET | Freq: Every day | ORAL | Status: DC

## 2022-06-17 NOTE — Progress Notes (Unsigned)
Tick bite on her back.  About 1 month ago.  Removed.  Was itchy.  No FCNAVD.  Used zinc and hydrocortisone on it last night.    Diabetes:  Using medications without difficulties: on jardiance, no ADE on med.   Hypoglycemic episodes: no sx recently.  1 episode a few months ago resolved with a pepsi, single event.   Hyperglycemic episodes: no sx Feet problems:no tingling, prev L ankle edema is better now.   Blood Sugars averaging: not checked.   eye exam within last year: d/w pt about follow up.    Recheck CBC and iron pending for h/o anemia.  Still on daily iron.    Meds, vitals, and allergies reviewed.   ROS: Per HPI unless specifically indicated in ROS section   GEN: nad, alert and oriented HEENT: ncat NECK: supple w/o LA CV: rrr. PULM: ctab, no inc wob ABD: soft, +bs EXT: no edema SKIN: no acute rash, minimal residual irritation at tick bite site on the R upper back, <<1cm, no residual tick seen.

## 2022-06-17 NOTE — Patient Instructions (Signed)
Call about an eye appointment when possible.  Go to the lab on the way out.   If you have mychart we'll likely use that to update you.    Take care.  Glad to see you.

## 2022-06-18 LAB — CBC WITH DIFFERENTIAL/PLATELET
Basophils Absolute: 0.1 10*3/uL (ref 0.0–0.1)
Basophils Relative: 1.2 % (ref 0.0–3.0)
Eosinophils Absolute: 0.2 10*3/uL (ref 0.0–0.7)
Eosinophils Relative: 1.8 % (ref 0.0–5.0)
HCT: 42.6 % (ref 36.0–46.0)
Hemoglobin: 14 g/dL (ref 12.0–15.0)
Lymphocytes Relative: 20.9 % (ref 12.0–46.0)
Lymphs Abs: 2 10*3/uL (ref 0.7–4.0)
MCHC: 33 g/dL (ref 30.0–36.0)
MCV: 88.9 fl (ref 78.0–100.0)
Monocytes Absolute: 1 10*3/uL (ref 0.1–1.0)
Monocytes Relative: 10.7 % (ref 3.0–12.0)
Neutro Abs: 6.1 10*3/uL (ref 1.4–7.7)
Neutrophils Relative %: 65.4 % (ref 43.0–77.0)
Platelets: 288 10*3/uL (ref 150.0–400.0)
RBC: 4.79 Mil/uL (ref 3.87–5.11)
RDW: 14 % (ref 11.5–15.5)
WBC: 9.4 10*3/uL (ref 4.0–10.5)

## 2022-06-18 LAB — IRON: Iron: 39 ug/dL — ABNORMAL LOW (ref 42–145)

## 2022-06-18 LAB — HEMOGLOBIN A1C: Hgb A1c MFr Bld: 9.1 % — ABNORMAL HIGH (ref 4.6–6.5)

## 2022-06-19 ENCOUNTER — Encounter: Payer: Self-pay | Admitting: Family Medicine

## 2022-06-19 DIAGNOSIS — R238 Other skin changes: Secondary | ICD-10-CM | POA: Insufficient documentation

## 2022-06-19 NOTE — Assessment & Plan Note (Signed)
At previous tick bite site.  I expect this to resolve.  Can continue topical hydrocortisone as needed for itching. Reassured and she can update me as needed.

## 2022-06-19 NOTE — Assessment & Plan Note (Signed)
Would continue Jardiance for now.  See notes on labs.

## 2022-06-19 NOTE — Assessment & Plan Note (Signed)
See notes on labs.  Has been taking iron daily.

## 2022-07-03 ENCOUNTER — Other Ambulatory Visit: Payer: Self-pay | Admitting: Cardiovascular Disease

## 2022-07-03 ENCOUNTER — Other Ambulatory Visit: Payer: Self-pay | Admitting: Family Medicine

## 2022-07-15 ENCOUNTER — Telehealth: Payer: Self-pay | Admitting: Family Medicine

## 2022-07-15 MED ORDER — GLUCOSE BLOOD VI STRP: ORAL_STRIP | 3 refills | Status: AC

## 2022-07-15 NOTE — Telephone Encounter (Signed)
  Encourage patient to contact the pharmacy for refills or they can request refills through [facility]  Did the patient contact the pharmacy:  n   LAST APPOINTMENT DATE:  Please schedule appointment if longer than 1 year  NEXT APPOINTMENT DATE:  MEDICATION:glucose blood (ONE TOUCH ULTRA TEST) test strip  Is the patient out of medication? y  If not, how much is left?  Is this a 35 day supply:   [id number] [place] [address] AT [place]:  [phone] Fax:  [phone]     Let patient know to contact pharmacy at the end of the day to make sure medication is ready.  Please notify patient to allow 48-72 hours to process

## 2022-07-15 NOTE — Telephone Encounter (Signed)
Erx sent

## 2022-07-17 ENCOUNTER — Telehealth: Payer: Self-pay | Admitting: Family Medicine

## 2022-07-17 NOTE — Telephone Encounter (Signed)
Patient grandson called stating she has been taking Jardiance since January. He stated the price has triple since then and he was wondering if there was another medication she could be given. Thank you!

## 2022-07-17 NOTE — Telephone Encounter (Addendum)
I would like pharmacy input from [facility].  Can you offer guidance here?  Thanks.

## 2022-07-18 ENCOUNTER — Encounter: Payer: Self-pay | Admitting: Cardiovascular Disease

## 2022-07-18 DIAGNOSIS — L821 Other seborrheic keratosis: Secondary | ICD-10-CM | POA: Diagnosis not present

## 2022-07-18 DIAGNOSIS — C44622 Squamous cell carcinoma of skin of right upper limb, including shoulder: Secondary | ICD-10-CM | POA: Diagnosis not present

## 2022-07-18 DIAGNOSIS — L570 Actinic keratosis: Secondary | ICD-10-CM | POA: Diagnosis not present

## 2022-07-18 DIAGNOSIS — D692 Other nonthrombocytopenic purpura: Secondary | ICD-10-CM | POA: Diagnosis not present

## 2022-07-18 DIAGNOSIS — Z85828 Personal history of other malignant neoplasm of skin: Secondary | ICD-10-CM | POA: Diagnosis not present

## 2022-07-18 NOTE — Telephone Encounter (Addendum)
Patient will qualify for [place] for Jardiance (Cardiomyopathy) which will print patient's cost to $0.  Spoke with patient and grandson, enrolled in [employer] successfully. Active dates 06/18/22 - 06/18/23.  Pharmacy card info: CARD NO. [id number]   BIN [id number]   PCN [id number]   PC GROUP [id number]

## 2022-07-19 NOTE — Telephone Encounter (Signed)
Many thanks. 

## 2022-07-22 ENCOUNTER — Telehealth: Payer: Self-pay | Admitting: Family Medicine

## 2022-07-22 NOTE — Telephone Encounter (Signed)
[facility] [place] [facility]. Patient cost will be $0.  Charlene Brooke, CPP notified  Marijean Niemann, [place] Clinical Pharmacy Assistant [phone]

## 2022-07-22 NOTE — Telephone Encounter (Signed)
Tried calling grandson back at that number given; wrong number.  Charlene Brooke, CPP notified  Marijean Niemann, [place] Clinical Pharmacy Assistant [phone]

## 2022-07-22 NOTE — Telephone Encounter (Signed)
Attempted call back to grandson. VM not set up.

## 2022-07-22 NOTE — Telephone Encounter (Signed)
Patient grandson called to talk to the [employer]. Call back number [phone].

## 2022-07-23 NOTE — Progress Notes (Signed)
Return Grandson's (Tim) call; no answer; left voicemail.   Charlene Brooke, CPP notified  Marijean Niemann, [place] Clinical Pharmacy Assistant [phone]

## 2022-07-31 DIAGNOSIS — C44622 Squamous cell carcinoma of skin of right upper limb, including shoulder: Secondary | ICD-10-CM | POA: Diagnosis not present

## 2022-08-18 ENCOUNTER — Other Ambulatory Visit: Payer: Self-pay | Admitting: Cardiovascular Disease

## 2022-09-03 ENCOUNTER — Encounter: Payer: Self-pay | Admitting: Family Medicine

## 2022-09-17 ENCOUNTER — Ambulatory Visit (INDEPENDENT_AMBULATORY_CARE_PROVIDER_SITE_OTHER): Payer: Medicare HMO | Admitting: Family Medicine

## 2022-09-17 ENCOUNTER — Encounter: Payer: Self-pay | Admitting: Family Medicine

## 2022-09-17 VITALS — BP 116/78 | HR 91 | Temp 98.2°F | Ht 63.0 in | Wt 179.0 lb

## 2022-09-17 DIAGNOSIS — Z23 Encounter for immunization: Secondary | ICD-10-CM | POA: Diagnosis not present

## 2022-09-17 DIAGNOSIS — E611 Iron deficiency: Secondary | ICD-10-CM | POA: Diagnosis not present

## 2022-09-17 DIAGNOSIS — E119 Type 2 diabetes mellitus without complications: Secondary | ICD-10-CM | POA: Diagnosis not present

## 2022-09-17 DIAGNOSIS — R202 Paresthesia of skin: Secondary | ICD-10-CM

## 2022-09-17 DIAGNOSIS — M67449 Ganglion, unspecified hand: Secondary | ICD-10-CM | POA: Diagnosis not present

## 2022-09-17 DIAGNOSIS — D649 Anemia, unspecified: Secondary | ICD-10-CM

## 2022-09-17 MED ORDER — FERROUS SULFATE 325 (65 FE) MG PO TABS: 325.0000 mg | ORAL_TABLET | ORAL | Status: DC

## 2022-09-17 MED ORDER — ROSUVASTATIN CALCIUM 10 MG PO TABS: 10.0000 mg | ORAL_TABLET | Freq: Every day | ORAL | 3 refills | Status: DC

## 2022-09-17 NOTE — Patient Instructions (Addendum)
Go to the lab on the way out.   If you have mychart we'll likely use that to update you.    Take care.  Glad to see you. If lidocaine spray doesn't help with foot pain, then let me know.   Let us know if you don't get a call about seeing ortho in [place] about your finger.

## 2022-09-17 NOTE — Progress Notes (Unsigned)
R 3rd finger, lesion at DIP on the thumb side.  Noted in the last few weeks.  Not draining.   Bruising noted on aspirin and plavix.    H/o low iron.  Taking iron QOD.  Recheck labs pending.    No recent lasix use.    Diabetes:  Using medications without difficulties: yes Hypoglycemic episodes: no sx Hyperglycemic episodes: no sx Feet problems: inc sensitivity in the feet, ie concern for neuropathy. Lidocaine topically helped.   Blood Sugars averaging: not checked often.   Labs pending.    Living with her grandson.  He is helping with medicines, etc.  He has noted some short term changes.  No red flag events.  We agreed to follow clinically.  PMH and SH reviewed  Meds, vitals, and allergies reviewed.   ROS: Per HPI unless specifically indicated in ROS section   GEN: nad, alert and pleasant conversation, walks with a cane. HEENT: ncat NECK: supple w/o LA CV: rrr. PULM: ctab, no inc wob ABD: soft, +bs EXT: no edema SKIN: no acute rash but senile purpura noted on the extensor surface of the left hand. R 3rd finger, lesion at DIP on the thumb side.  Appears to be a cystic lesion without ulceration or infection.  She has chronic IP joint changes at baseline.  Diabetic foot exam: Normal inspection No skin breakdown No calluses  Normal DP pulses Normal sensation to light touch but dec to monofilament Nails normal  33 minutes were devoted to patient care in this encounter (this includes time spent reviewing the patient's file/history, interviewing and examining the patient, counseling/reviewing plan with patient).

## 2022-09-18 DIAGNOSIS — M67449 Ganglion, unspecified hand: Secondary | ICD-10-CM | POA: Insufficient documentation

## 2022-09-18 LAB — CBC WITH DIFFERENTIAL/PLATELET
Basophils Absolute: 0.1 10*3/uL (ref 0.0–0.1)
Basophils Relative: 0.9 % (ref 0.0–3.0)
Eosinophils Absolute: 0.3 10*3/uL (ref 0.0–0.7)
Eosinophils Relative: 3.2 % (ref 0.0–5.0)
HCT: 46.2 % — ABNORMAL HIGH (ref 36.0–46.0)
Hemoglobin: 15.4 g/dL — ABNORMAL HIGH (ref 12.0–15.0)
Lymphocytes Relative: 22.5 % (ref 12.0–46.0)
Lymphs Abs: 2.1 10*3/uL (ref 0.7–4.0)
MCHC: 33.4 g/dL (ref 30.0–36.0)
MCV: 91 fl (ref 78.0–100.0)
Monocytes Absolute: 0.9 10*3/uL (ref 0.1–1.0)
Monocytes Relative: 9.6 % (ref 3.0–12.0)
Neutro Abs: 6 10*3/uL (ref 1.4–7.7)
Neutrophils Relative %: 63.8 % (ref 43.0–77.0)
Platelets: 338 10*3/uL (ref 150.0–400.0)
RBC: 5.07 Mil/uL (ref 3.87–5.11)
RDW: 13.5 % (ref 11.5–15.5)
WBC: 9.3 10*3/uL (ref 4.0–10.5)

## 2022-09-18 LAB — FERRITIN: Ferritin: 54.3 ng/mL (ref 10.0–291.0)

## 2022-09-18 LAB — BASIC METABOLIC PANEL
BUN: 17 mg/dL (ref 6–23)
CO2: 32 mEq/L (ref 19–32)
Calcium: 9.8 mg/dL (ref 8.4–10.5)
Chloride: 98 mEq/L (ref 96–112)
Creatinine, Ser: 1.39 mg/dL — ABNORMAL HIGH (ref 0.40–1.20)
GFR: 32.55 mL/min — ABNORMAL LOW (ref >60.00)
Glucose, Bld: 170 mg/dL — ABNORMAL HIGH (ref 70–99)
Potassium: 4.2 mEq/L (ref 3.5–5.1)
Sodium: 139 mEq/L (ref 135–145)

## 2022-09-18 LAB — HEMOGLOBIN A1C: Hgb A1c MFr Bld: 9.3 % — ABNORMAL HIGH (ref 4.6–6.5)

## 2022-09-18 LAB — IRON: Iron: 46 ug/dL (ref 42–145)

## 2022-09-18 LAB — VITAMIN B12: Vitamin B-12: 884 pg/mL (ref 211–911)

## 2022-09-18 NOTE — Assessment & Plan Note (Signed)
History of low iron.  Would still continue aspirin and Plavix for now.  Has been taking iron every other day.  See notes on labs.

## 2022-09-18 NOTE — Assessment & Plan Note (Signed)
No change in medications at this point.  See notes on labs.  I suspect she does have neuropathy and topical lidocaine helps with her nocturnal symptoms.  I would continue with that.  We can use gabapentin if needed but that would potentially increase her fall risk.  Discussed.  Continue Jardiance as is.

## 2022-09-18 NOTE — Assessment & Plan Note (Signed)
Reasonable to see orthopedics/hand clinic to see about options.  Referred.  Discussed.

## 2022-09-26 ENCOUNTER — Other Ambulatory Visit: Payer: Self-pay | Admitting: Family Medicine

## 2022-09-26 DIAGNOSIS — E119 Type 2 diabetes mellitus without complications: Secondary | ICD-10-CM

## 2022-09-26 DIAGNOSIS — E611 Iron deficiency: Secondary | ICD-10-CM

## 2022-10-21 DIAGNOSIS — D485 Neoplasm of uncertain behavior of skin: Secondary | ICD-10-CM | POA: Diagnosis not present

## 2022-10-21 DIAGNOSIS — D0462 Carcinoma in situ of skin of left upper limb, including shoulder: Secondary | ICD-10-CM | POA: Diagnosis not present

## 2022-10-21 DIAGNOSIS — D692 Other nonthrombocytopenic purpura: Secondary | ICD-10-CM | POA: Diagnosis not present

## 2022-10-21 DIAGNOSIS — Z85828 Personal history of other malignant neoplasm of skin: Secondary | ICD-10-CM | POA: Diagnosis not present

## 2022-10-21 DIAGNOSIS — L821 Other seborrheic keratosis: Secondary | ICD-10-CM | POA: Diagnosis not present

## 2022-10-21 DIAGNOSIS — L570 Actinic keratosis: Secondary | ICD-10-CM | POA: Diagnosis not present

## 2022-10-24 ENCOUNTER — Other Ambulatory Visit: Payer: Self-pay | Admitting: Cardiovascular Disease

## 2022-12-21 ENCOUNTER — Other Ambulatory Visit: Payer: Self-pay | Admitting: Cardiovascular Disease

## 2023-01-20 DIAGNOSIS — L821 Other seborrheic keratosis: Secondary | ICD-10-CM | POA: Diagnosis not present

## 2023-01-20 DIAGNOSIS — L570 Actinic keratosis: Secondary | ICD-10-CM | POA: Diagnosis not present

## 2023-01-20 DIAGNOSIS — D485 Neoplasm of uncertain behavior of skin: Secondary | ICD-10-CM | POA: Diagnosis not present

## 2023-01-20 DIAGNOSIS — C44629 Squamous cell carcinoma of skin of left upper limb, including shoulder: Secondary | ICD-10-CM | POA: Diagnosis not present

## 2023-02-25 ENCOUNTER — Telehealth: Payer: Self-pay | Admitting: Family Medicine

## 2023-02-25 NOTE — Telephone Encounter (Signed)
Contacted Cathy Larsen to schedule their annual wellness visit. Appointment made for 03/12/2023. LVM informing patient of new appt day and time.  [facility] Care Guide [facility] AWV TEAM Direct Dial: [phone]

## 2023-03-06 ENCOUNTER — Ambulatory Visit (INDEPENDENT_AMBULATORY_CARE_PROVIDER_SITE_OTHER): Payer: Medicare HMO | Admitting: Family Medicine

## 2023-03-06 ENCOUNTER — Encounter: Payer: Self-pay | Admitting: Family Medicine

## 2023-03-06 VITALS — BP 110/62 | HR 93 | Temp 98.0°F | Ht 63.0 in | Wt 180.0 lb

## 2023-03-06 DIAGNOSIS — I10 Essential (primary) hypertension: Secondary | ICD-10-CM

## 2023-03-06 DIAGNOSIS — E119 Type 2 diabetes mellitus without complications: Secondary | ICD-10-CM

## 2023-03-06 DIAGNOSIS — I5032 Chronic diastolic (congestive) heart failure: Secondary | ICD-10-CM

## 2023-03-06 DIAGNOSIS — Z7984 Long term (current) use of oral hypoglycemic drugs: Secondary | ICD-10-CM

## 2023-03-06 DIAGNOSIS — D649 Anemia, unspecified: Secondary | ICD-10-CM | POA: Diagnosis not present

## 2023-03-06 DIAGNOSIS — R413 Other amnesia: Secondary | ICD-10-CM | POA: Diagnosis not present

## 2023-03-06 DIAGNOSIS — E611 Iron deficiency: Secondary | ICD-10-CM

## 2023-03-06 DIAGNOSIS — I2510 Atherosclerotic heart disease of native coronary artery without angina pectoris: Secondary | ICD-10-CM

## 2023-03-06 MED ORDER — ASPIRIN 81 MG PO TBEC: DELAYED_RELEASE_TABLET | ORAL | 3 refills | Status: DC

## 2023-03-06 MED ORDER — HYDROCHLOROTHIAZIDE 25 MG PO TABS: 25.0000 mg | ORAL_TABLET | Freq: Every day | ORAL | 3 refills | Status: DC

## 2023-03-06 MED ORDER — LOSARTAN POTASSIUM 50 MG PO TABS: 50.0000 mg | ORAL_TABLET | Freq: Every day | ORAL | 3 refills | Status: DC

## 2023-03-06 MED ORDER — CLOPIDOGREL BISULFATE 75 MG PO TABS: 75.0000 mg | ORAL_TABLET | Freq: Every day | ORAL | 3 refills | Status: DC

## 2023-03-06 NOTE — Patient Instructions (Signed)
Go to the lab on the way out.   If you have mychart we'll likely use that to update you.    Let me see about the head CT in [facility] after I get you labs back.  Take care.  Glad to see you.

## 2023-03-06 NOTE — Progress Notes (Signed)
Diabetes:  Using medications without difficulties: yes Hypoglycemic episodes:no Hyperglycemic episodes:no Feet problems:no Blood Sugars averaging: 145- 250 eye exam within last year: d/w pt, family will check on that.   Labs pending.    She had some lower back pain that is getting better. Attributed to muscular source.  Voltaren gel helped.    Rare use of lasix.    Recheck HGB pending.  Off iron in the meantime.  See notes on labs.    Patient noted memory changes. Variable recall, ie not every day is the same.  Family noted changes, ie scheduling for today's visit.  Long term memory is better than short term changes.    PMH and SH reviewed  Meds, vitals, and allergies reviewed.   ROS: Per HPI unless specifically indicated in ROS section   GEN: nad, alert and oriented HEENT: mucous membranes moist NECK: supple w/o LA CV: rrr. PULM: ctab, no inc wob ABD: soft, +bs EXT: no edema SKIN: no acute rash  Year, month, day wnl.  3/3 attention. Can do math and read a watch face.  0/3 recall.    30 minutes were devoted to patient care in this encounter (this includes time spent reviewing the patient's file/history, interviewing and examining the patient, counseling/reviewing plan with patient).

## 2023-03-07 ENCOUNTER — Encounter: Payer: Self-pay | Admitting: Family Medicine

## 2023-03-07 LAB — CBC WITH DIFFERENTIAL/PLATELET
Basophils Absolute: 0.1 10*3/uL (ref 0.0–0.1)
Basophils Relative: 1.2 % (ref 0.0–3.0)
Eosinophils Absolute: 0.3 10*3/uL (ref 0.0–0.7)
Eosinophils Relative: 3.7 % (ref 0.0–5.0)
HCT: 42.1 % (ref 36.0–46.0)
Hemoglobin: 14.1 g/dL (ref 12.0–15.0)
Lymphocytes Relative: 23.9 % (ref 12.0–46.0)
Lymphs Abs: 2.2 10*3/uL (ref 0.7–4.0)
MCHC: 33.6 g/dL (ref 30.0–36.0)
MCV: 88.3 fl (ref 78.0–100.0)
Monocytes Absolute: 0.9 10*3/uL (ref 0.1–1.0)
Monocytes Relative: 9.5 % (ref 3.0–12.0)
Neutro Abs: 5.6 10*3/uL (ref 1.4–7.7)
Neutrophils Relative %: 61.7 % (ref 43.0–77.0)
Platelets: 286 10*3/uL (ref 150.0–400.0)
RBC: 4.77 Mil/uL (ref 3.87–5.11)
RDW: 13.3 % (ref 11.5–15.5)
WBC: 9.1 10*3/uL (ref 4.0–10.5)

## 2023-03-07 LAB — LIPID PANEL
Cholesterol: 123 mg/dL (ref 0–200)
HDL: 31.2 mg/dL — ABNORMAL LOW (ref >39.00)
NonHDL: 91.44
Total CHOL/HDL Ratio: 4
Triglycerides: 202 mg/dL — ABNORMAL HIGH (ref 0.0–149.0)
VLDL: 40.4 mg/dL — ABNORMAL HIGH (ref 0.0–40.0)

## 2023-03-07 LAB — LDL CHOLESTEROL, DIRECT: Direct LDL: 70 mg/dL

## 2023-03-07 LAB — COMPREHENSIVE METABOLIC PANEL
ALT: 12 U/L (ref 0–35)
AST: 16 U/L (ref 0–37)
Albumin: 3.8 g/dL (ref 3.5–5.2)
Alkaline Phosphatase: 60 U/L (ref 39–117)
BUN: 17 mg/dL (ref 6–23)
CO2: 32 mEq/L (ref 19–32)
Calcium: 9.2 mg/dL (ref 8.4–10.5)
Chloride: 97 mEq/L (ref 96–112)
Creatinine, Ser: 1.4 mg/dL — ABNORMAL HIGH (ref 0.40–1.20)
GFR: 32.17 mL/min — ABNORMAL LOW (ref >60.00)
Glucose, Bld: 187 mg/dL — ABNORMAL HIGH (ref 70–99)
Potassium: 3.6 mEq/L (ref 3.5–5.1)
Sodium: 139 mEq/L (ref 135–145)
Total Bilirubin: 0.5 mg/dL (ref 0.2–1.2)
Total Protein: 6.2 g/dL (ref 6.0–8.3)

## 2023-03-07 LAB — HEMOGLOBIN A1C: Hgb A1c MFr Bld: 8.1 % — ABNORMAL HIGH (ref 4.6–6.5)

## 2023-03-07 LAB — FERRITIN: Ferritin: 40.3 ng/mL (ref 10.0–291.0)

## 2023-03-07 LAB — IRON: Iron: 51 ug/dL (ref 42–145)

## 2023-03-09 ENCOUNTER — Other Ambulatory Visit: Payer: Self-pay | Admitting: Family Medicine

## 2023-03-09 DIAGNOSIS — R413 Other amnesia: Secondary | ICD-10-CM | POA: Insufficient documentation

## 2023-03-09 NOTE — Assessment & Plan Note (Signed)
No change in medication at this point.  I want to see her labs first before we make any changes.  Patient and family agree.

## 2023-03-09 NOTE — Assessment & Plan Note (Signed)
Family is helping patient at home, especially with med administration.  See notes on labs.  Discussed plan for CT re: memory after checking labs.   Needs CT in [place].

## 2023-03-09 NOTE — Assessment & Plan Note (Signed)
Recheck HGB pending.  Off iron in the meantime.  See notes on labs.

## 2023-03-10 ENCOUNTER — Encounter: Payer: Self-pay | Admitting: *Deleted

## 2023-03-10 MED ORDER — ALPHA-LIPOIC ACID 300 MG PO CAPS: ORAL_CAPSULE | ORAL | 0 refills | Status: AC

## 2023-03-17 IMAGING — DX DG CHEST 1V PORT
1 series · 1 of 1 positions shown · non-contrast
Comparison: Portable exam 2251 hours compared 07/04/2012

CLINICAL DATA: Shortness of breath

EXAM:
PORTABLE CHEST 1 VIEW

[chest ap]
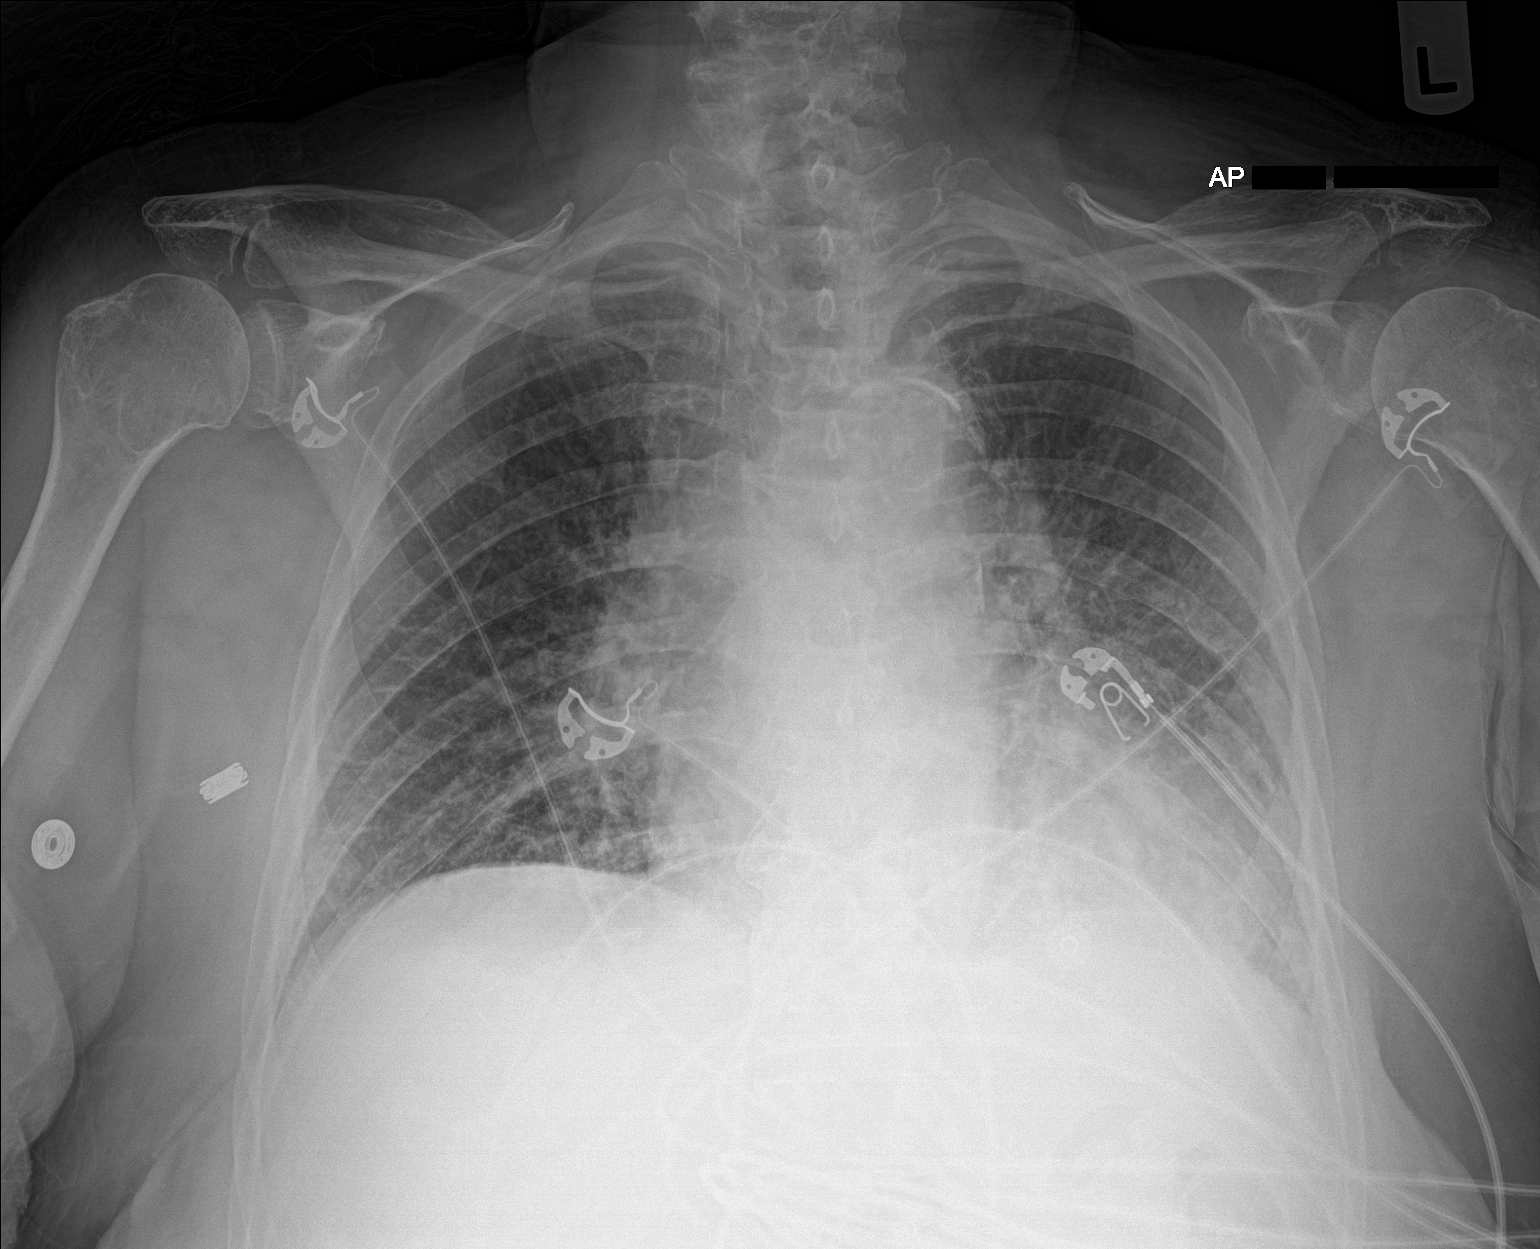

[1 of 1 positions shown; findings below may reference images not displayed]

FINDINGS: Enlargement of cardiac silhouette with pulmonary vascular
congestion.

Atherosclerotic calcification aorta.

Interstitial infiltrates mid to lower lungs favor pulmonary edema.

No pleural effusion or pneumothorax.

Bones demineralized.
IMPRESSION: Probable CHF.

Aortic Atherosclerosis (DSPB4-CH1.1).

## 2023-03-18 ENCOUNTER — Other Ambulatory Visit: Payer: Self-pay | Admitting: Family Medicine

## 2023-03-18 DIAGNOSIS — I5032 Chronic diastolic (congestive) heart failure: Secondary | ICD-10-CM

## 2023-03-18 DIAGNOSIS — I2510 Atherosclerotic heart disease of native coronary artery without angina pectoris: Secondary | ICD-10-CM

## 2023-03-18 DIAGNOSIS — I10 Essential (primary) hypertension: Secondary | ICD-10-CM

## 2023-03-18 IMAGING — DX DG CHEST 1V PORT
1 series · 1 of 1 positions shown · non-contrast
Comparison: Chest radiograph from one day prior.

CLINICAL DATA: Dyspnea

EXAM:
PORTABLE CHEST 1 VIEW

[chest ap]
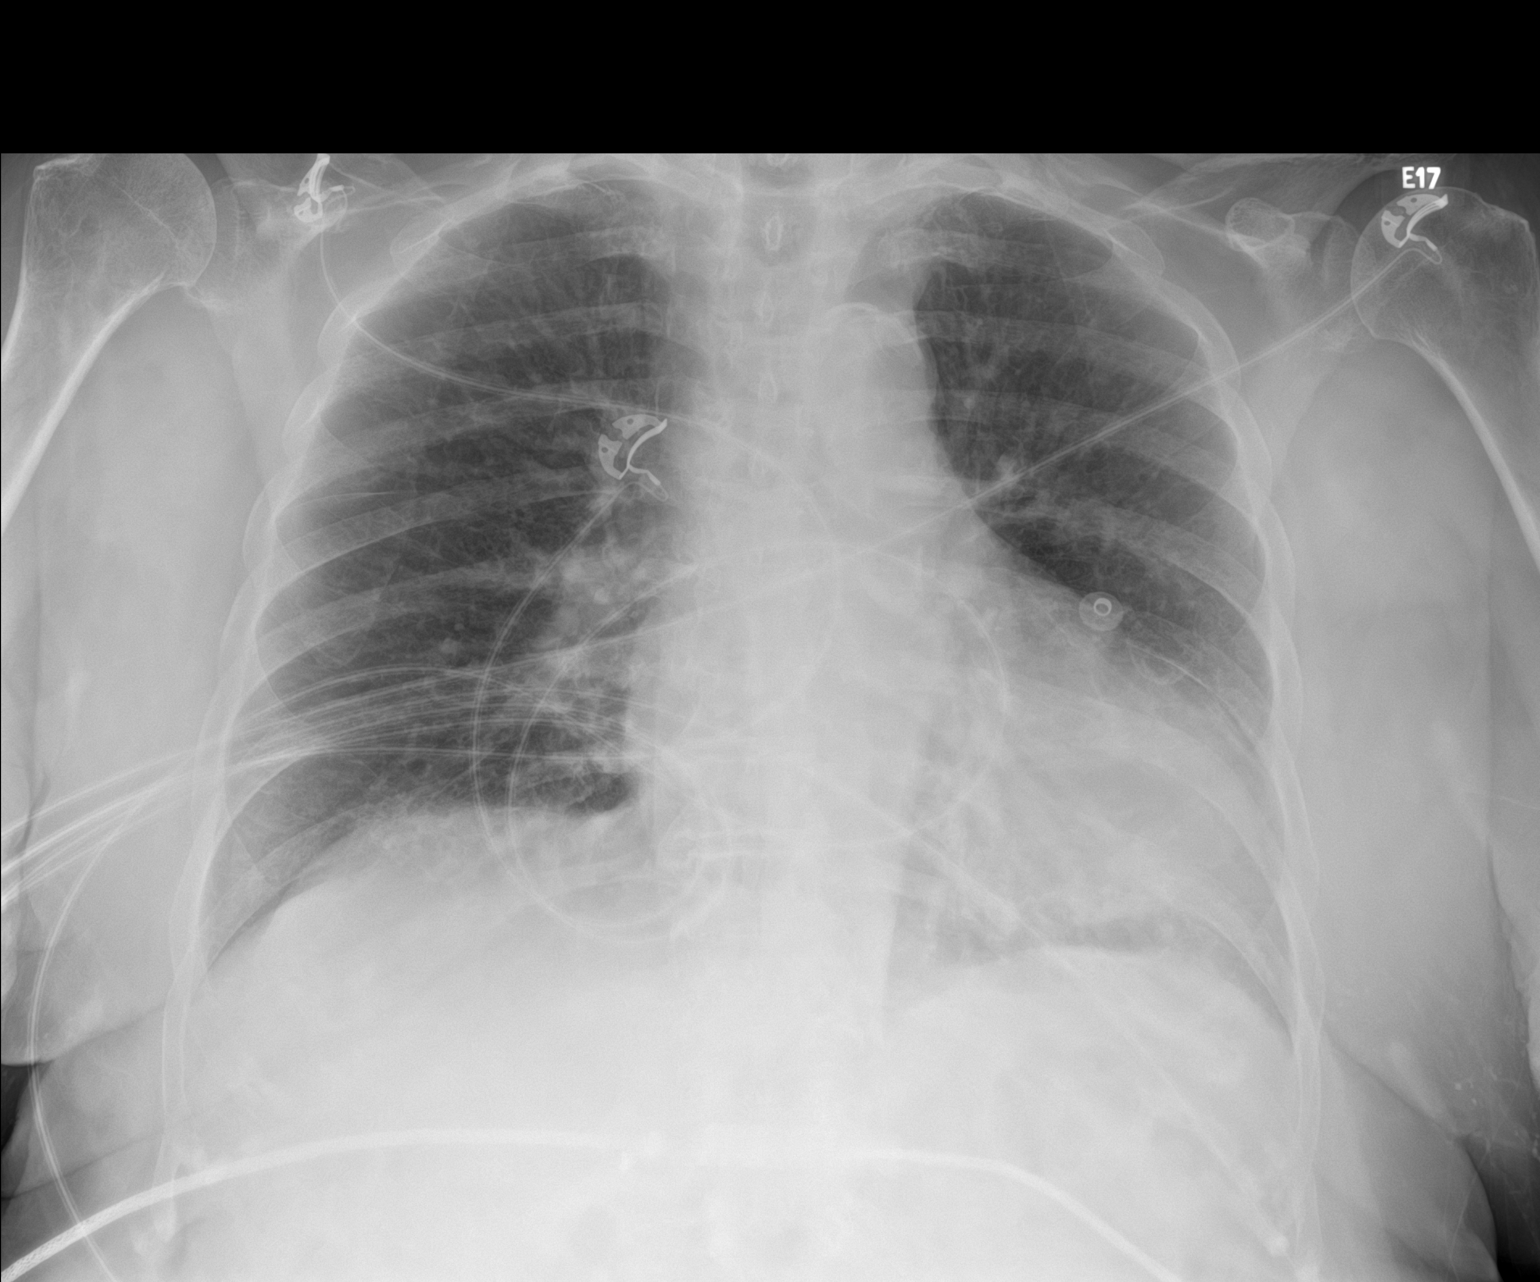

[1 of 1 positions shown; findings below may reference images not displayed]

FINDINGS: Stable cardiomediastinal silhouette with top-normal heart size. No
pneumothorax. No pleural effusion. Borderline mild pulmonary edema,
improved. Similar mild reticular opacities at the lung bases.
IMPRESSION: 1. Borderline mild pulmonary edema, improved.
2. Nonspecific mild scarring at the lung bases.

## 2023-03-18 NOTE — Telephone Encounter (Signed)
[facility] has also being trying to reach the patient to schedule.   03/17/23-2nd appt/no vm to leave message-pansy 03/12/23 1st attempt left vm/ab   I have sent two mychart messages, she read the last message but did not respond.

## 2023-03-28 ENCOUNTER — Ambulatory Visit
Admission: RE | Admit: 2023-03-28 | Discharge: 2023-03-28 | Disposition: A | Discharge status: Home / Self Care | Payer: Medicare HMO | Source: Ambulatory Visit | Attending: Family Medicine | Admitting: Family Medicine

## 2023-03-28 DIAGNOSIS — R413 Other amnesia: Secondary | ICD-10-CM

## 2023-03-28 DIAGNOSIS — G319 Degenerative disease of nervous system, unspecified: Secondary | ICD-10-CM | POA: Diagnosis not present

## 2023-04-04 ENCOUNTER — Other Ambulatory Visit: Payer: Self-pay | Admitting: Family Medicine

## 2023-04-04 ENCOUNTER — Telehealth: Payer: Self-pay | Admitting: Family Medicine

## 2023-04-04 MED ORDER — MEMANTINE HCL 5 MG PO TABS: ORAL_TABLET | ORAL | 5 refills | Status: DC

## 2023-04-04 NOTE — Telephone Encounter (Signed)
Spoke with Cathy Larsen. See result note.

## 2023-04-04 NOTE — Telephone Encounter (Signed)
Patient's grandson contacted the office regarding a missed call from [place]. I could not find any documentation but caller was asking for a call back if possible, please advise.

## 2023-04-18 ENCOUNTER — Other Ambulatory Visit: Payer: Self-pay | Admitting: Cardiovascular Disease

## 2023-04-22 DIAGNOSIS — D0461 Carcinoma in situ of skin of right upper limb, including shoulder: Secondary | ICD-10-CM | POA: Diagnosis not present

## 2023-04-22 DIAGNOSIS — L570 Actinic keratosis: Secondary | ICD-10-CM | POA: Diagnosis not present

## 2023-04-22 DIAGNOSIS — D0471 Carcinoma in situ of skin of right lower limb, including hip: Secondary | ICD-10-CM | POA: Diagnosis not present

## 2023-04-22 DIAGNOSIS — D485 Neoplasm of uncertain behavior of skin: Secondary | ICD-10-CM | POA: Diagnosis not present

## 2023-04-26 ENCOUNTER — Other Ambulatory Visit: Payer: Self-pay | Admitting: Family Medicine

## 2023-06-04 ENCOUNTER — Encounter (INDEPENDENT_AMBULATORY_CARE_PROVIDER_SITE_OTHER): Payer: Self-pay

## 2023-06-27 ENCOUNTER — Other Ambulatory Visit: Payer: Self-pay | Admitting: Cardiovascular Disease

## 2023-06-30 ENCOUNTER — Telehealth: Payer: Self-pay | Admitting: Family Medicine

## 2023-06-30 ENCOUNTER — Other Ambulatory Visit: Payer: Self-pay | Admitting: Pharmacist

## 2023-06-30 ENCOUNTER — Ambulatory Visit (INDEPENDENT_AMBULATORY_CARE_PROVIDER_SITE_OTHER): Payer: Medicare HMO

## 2023-06-30 DIAGNOSIS — Z0000 Encounter for general adult medical examination without abnormal findings: Secondary | ICD-10-CM | POA: Diagnosis not present

## 2023-06-30 NOTE — Patient Instructions (Signed)
Ms. Cleverly , Thank you for taking time to come for your Medicare Wellness Visit. I appreciate your ongoing commitment to your health goals. Please review the following plan we discussed and let me know if I can assist you in the future.   Referrals/Orders/Follow-Ups/Clinician Recommendations: none  This is a list of the screening recommended for you and due dates:  Health Maintenance  Topic Date Due   Zoster (Shingles) Vaccine (1 of 2) 07/10/1978   DTaP/Tdap/Td vaccine (3 - Tdap) 06/23/2018   Eye exam for diabetics  05/11/2022   COVID-19 Vaccine (4 - 2023-24 season) 07/05/2022   Flu Shot  06/05/2023   Hemoglobin A1C  09/06/2023   Complete foot exam   09/18/2023   Medicare Annual Wellness Visit  06/29/2024   Pneumonia Vaccine  Completed   DEXA scan (bone density measurement)  Completed   HPV Vaccine  Aged Out    Advanced directives: (In Chart) A copy of your advanced directives are scanned into your chart should your provider ever need it.  Next Medicare Annual Wellness Visit scheduled for next year: Yes  Insert Preventive Care attachment Insert FALL PREVENTION attachment if needed

## 2023-06-30 NOTE — Telephone Encounter (Signed)
Patient's grandson contacted the office regarding patient assistance for medication JARDIANCE 10 MG TABS tablet , states patient has received a grant for this medication which is given to patients with either DM or heart failure. Grandson says patient's grant for this medication has expired and she was told in the past  to reach out if this happened so they may re apply for it. Please advise patient or grandson

## 2023-06-30 NOTE — Progress Notes (Signed)
Subjective:   Cathy Larsen is a 89 y.o. female who presents for Medicare Annual (Subsequent) preventive examination.  Visit Complete: Virtual  I connected with  Murlean Hark on 06/30/23 by a audio enabled telemedicine application and verified that I am speaking with the correct person using two identifiers. Lucila Maine was also on the call.  Patient Location: Home  Provider Location: Office/Clinic  I discussed the limitations of evaluation and management by telemedicine. The patient expressed understanding and agreed to proceed.  Vital Signs: Unable to obtain new vitals due to this being a telehealth visit.  Review of Systems     Cardiac Risk Factors include: advanced age (>30men, >57 women);diabetes mellitus;dyslipidemia;hypertension     Objective:    Today's Vitals   There is no height or weight on file to calculate BMI.     06/30/2023    3:08 PM 03/08/2022    1:14 PM 11/16/2021   10:49 AM 11/10/2021    5:10 PM 05/20/2021    4:31 PM 09/04/2018    9:24 AM 09/01/2017    9:10 AM  Advanced Directives  Does Patient Have a Medical Advance Directive? Yes Yes No No No Yes Yes  Type of Estate agent of [place];Living will Healthcare Power of [place];Living will    Healthcare Power of [place];Living will Healthcare Power of [place];Living will  Does patient want to make changes to medical advance directive?  No - Patient declined       Copy of Healthcare Power of Attorney in Chart? Yes - validated most recent copy scanned in chart (See row information) No - copy requested    No - copy requested Yes  Would patient like information on creating a medical advance directive?   No - Patient declined No - Patient declined No - Patient declined      Current Medications (verified) Outpatient Encounter Medications as of 06/30/2023  Medication Sig   Alpha-Lipoic Acid 300 MG CAPS Take one capsule daily.   aspirin EC 81 MG tablet TAKE 1 TABLET BY MOUTH EVERY DAY.  SWALLOW WHOLE   Carboxymethylcellulose Sodium (THERATEARS) 0.25 % SOLN Place 1 drop into both eyes at bedtime.   cetirizine (ZYRTEC) 10 MG tablet Take 10 mg by mouth daily.   clopidogrel (PLAVIX) 75 MG tablet Take 1 tablet (75 mg total) by mouth daily.   Coenzyme Q10 (CO Q-10) 100 MG CAPS Take 200 mg by mouth daily.   diclofenac Sodium (VOLTAREN) 1 % GEL Apply 4 g topically 4 (four) times daily as needed (feet pain).   ezetimibe (ZETIA) 10 MG tablet TAKE 1 TABLET BY MOUTH EVERY DAY   famotidine (PEPCID) 10 MG tablet Take 10 mg by mouth 2 (two) times daily.   Ginger, Zingiber officinalis, (GINGER ROOT) 500 MG CAPS Take 500 mg by mouth at bedtime.   glucose blood (ONE TOUCH ULTRA TEST) test strip USE DAILY TO CHECK SUGAR.  Dx 11.9.   hydrochlorothiazide (HYDRODIURIL) 25 MG tablet Take 1 tablet (25 mg total) by mouth daily.   JARDIANCE 10 MG TABS tablet TAKE 1 TABLET BY MOUTH EVERY DAY   losartan (COZAAR) 50 MG tablet Take 1 tablet (50 mg total) by mouth daily.   memantine (NAMENDA) 5 MG tablet TAKE 1 TABLET A DAY FOR 1 WEEK THEN 1 TABLET TWICE DAILY AFTER THAT.   Multiple Vitamin (MULTIVITAMIN WITH MINERALS) TABS Take 1 tablet by mouth at bedtime.   nitroGLYCERIN (NITROSTAT) 0.4 MG SL tablet Place 1 tablet (0.4 mg total) under  the tongue every 5 (five) minutes as needed for chest pain (max 3 tabs in 15 minutes).   Omega-3 Fatty Acids (FISH OIL) 1200 MG CAPS Take 1,200 mg by mouth daily.   potassium chloride (KLOR-CON) 10 MEQ tablet TAKE 1 TABLET BY MOUTH EVERY DAY   rosuvastatin (CRESTOR) 10 MG tablet Take 1 tablet (10 mg total) by mouth daily.   Turmeric 500 MG CAPS Take 500 mg by mouth at bedtime.   furosemide (LASIX) 20 MG tablet Take 1 tablet (20 mg total) by mouth as needed (For 3 pound weight gain in 24 hours or 5 pound weight gain in 1 week.).   No facility-administered encounter medications on file as of 06/30/2023.    Allergies (verified) Statins, Beta adrenergic blockers, Keflex  [cephalexin], and Requip [ropinirole hcl]   History: Past Medical History:  Diagnosis Date   Arthritis    feet and hands   CAD (coronary artery disease)    cath 1998 with 40% lesion in LAD but no other disease   Carpal tunnel syndrome    Diabetes mellitus    Gout 03/1998   Hyperlipidemia 08/1997   Hypertension 09/23/1995   Skin cancer    Past Surgical History:  Procedure Laterality Date   BIOPSY BREAST  03/2000   Ductogram right breast w/ biopsy- obstructing lesion benign by path. (Dr. Maple Hudson)    BLADDER SURGERY     bladder tack    CARDIAC CATHETERIZATION  03/1997   40% occulsion lad    CARPAL TUNNEL RELEASE Right 03/14/2014   Procedure: RIGHT CARPAL TUNNEL RELEASE;  Surgeon: Tami Ribas, MD;  Location: [facility];  Service: Orthopedics;  Laterality: Right;   CATARACT EXTRACTION     CORONARY/GRAFT ACUTE MI REVASCULARIZATION N/A 11/10/2021   Procedure: Coronary/Graft Acute MI Revascularization;  Surgeon: Lennette Bihari, MD;  [facility] IN[facility] CV LAB;  Service: Cardiovascular;  Laterality: N/A;   LEFT HEART CATH AND CORONARY ANGIOGRAPHY N/A 11/10/2021   Procedure: LEFT HEART CATH AND CORONARY ANGIOGRAPHY;  Surgeon: Lennette Bihari, MD;  Location: [facility] INVASIVE CV LAB;  Service: Cardiovascular;  Laterality: N/A;   PARTIAL HYSTERECTOMY     VAGINAL DELIVERY     x 3   YAG LASER APPLICATION Right 04/04/2014   Procedure: YAG LASER APPLICATION;  Surgeon: Susa Simmonds, MD;  Location: [facility];  Service: Ophthalmology;  Laterality: Right;   Family History  Problem Relation Age of Onset   Cancer Mother        breast    Kidney failure Mother    Cancer Father        colon    Cancer Brother        bladder    Cancer Brother        lung   Hypertension Sister    Hypertension Sister    Diabetes Neg Hx    Stroke Neg Hx    Social History   Socioeconomic History   Marital status: Widowed    Spouse name: Not on file   Number of children: 3   Years of education: Not on  file   Highest education level: Not on file  Occupational History   Occupation: Farms   Tobacco Use   Smoking status: Never   Smokeless tobacco: Never  Vaping Use   Vaping status: Never Used  Substance and Sexual Activity   Alcohol use: No    Alcohol/week: 0.0 standard drinks of alcohol   Drug use: No   Sexual activity:  Not Currently  Other Topics Concern   Not on file  Social History Narrative   Widowed 10/2012, husband had CVA and dementia   Grandson lives with patient as of 2018   Social Determinants of Health   Financial Resource Strain: Low Risk  (06/30/2023)   Overall Financial Resource Strain (CARDIA)    Difficulty of Paying Living Expenses: Not hard at all  Food Insecurity: No Food Insecurity (06/30/2023)   Hunger Vital Sign    Worried About Running Out of Food in the Last Year: Never true    Ran Out of Food in the Last Year: Never true  Transportation Needs: No Transportation Needs (06/30/2023)   PRAPARE - Administrator, Civil Service (Medical): No    Lack of Transportation (Non-Medical): No  Physical Activity: Insufficiently Active (06/30/2023)   Exercise Vital Sign    Days of Exercise per Week: 3 days    Minutes of Exercise per Session: 20 min  Stress: No Stress Concern Present (06/30/2023)   [employer] of Occupational Health - Occupational Stress Questionnaire    Feeling of Stress : Not at all  Social Connections: Socially Integrated (06/30/2023)   Social Connection and Isolation Panel [NHANES]    Frequency of Communication with Friends and Family: More than three times a week    Frequency of Social Gatherings with Friends and Family: Once a week    Attends Religious Services: More than 4 times per year    Active Member of [employer] or Organizations: Yes    Attends Engineer, structural: More than 4 times per year    Marital Status: Married    Tobacco Counseling Counseling given: Not Answered   Clinical Intake:  Pre-visit  preparation completed: Yes  Pain : No/denies pain     Nutritional Risks: None Diabetes: Yes CBG done?: No Did pt. bring in CBG monitor from home?: No  How often do you need to have someone help you when you read instructions, pamphlets, or other written materials from your doctor or pharmacy?: 1 - Never  Interpreter Needed?: No  Information entered by :: NAllen LPN   Activities of Daily Living    06/30/2023    3:01 PM  In your present state of health, do you have any difficulty performing the following activities:  Hearing? 0  Vision? 0  Difficulty concentrating or making decisions? 0  Walking or climbing stairs? 0  Dressing or bathing? 0  Doing errands, shopping? 0  Preparing Food and eating ? N  Using the Toilet? N  In the past six months, have you accidently leaked urine? N  Do you have problems with loss of bowel control? N  Managing your Medications? Y  Comment grandson manages  Managing your Finances? N  Housekeeping or managing your Housekeeping? N    Patient Care Team: Joaquim Nam, MD as PCP - General (Family Medicine) Nahser, Deloris Ping, MD as PCP - Cardiology (Cardiology) Arminda Resides, MD as Consulting Physician (Dermatology) Carrington Clamp, DPM (Inactive) as Consulting Physician (Podiatry) Breck Coons, DDS as Referring Physician (Dentistry) Blair Promise, OD as Referring Physician (Optometry)  Indicate any recent Medical Services you may have received from other than [facility] providers in the past year (date may be approximate).     Assessment:   This is a routine wellness examination for Ashleah.  Hearing/Vision screen Hearing Screening - Comments:: Denies hearing issues Vision Screening - Comments:: No regular eye exams, [facility]  Dietary issues and exercise activities  discussed:     Goals Addressed             This Visit's Progress    Patient Stated       06/30/2023, denies goals at this time       Depression  Screen    06/30/2023    3:10 PM 03/06/2023    3:58 PM 03/08/2022    1:09 PM 09/14/2020    2:33 PM 09/13/2019   11:01 AM 09/04/2018    9:00 AM 09/01/2017    9:09 AM  PHQ 2/9 Scores  PHQ - 2 Score 0 1 0 0 0 0 0  PHQ- 9 Score 0 6    0 0    Fall Risk    06/30/2023    3:09 PM 03/06/2023    3:58 PM 03/08/2022    1:12 PM 09/14/2020    2:33 PM 09/13/2019   11:01 AM  Fall Risk   Falls in the past year? 0 0 0 0 0  Number falls in past yr: 0 0 0    Injury with Fall? 0 0 0    Risk for fall due to : Impaired mobility;Impaired balance/gait;Medication side effect No Fall Risks No Fall Risks    Follow up Falls prevention discussed;Falls evaluation completed Falls evaluation completed       MEDICARE RISK AT HOME: Medicare Risk at Home Any stairs in or around the home?: Yes If so, are there any without handrails?: No Home free of loose throw rugs in walkways, pet beds, electrical cords, etc?: Yes Adequate lighting in your home to reduce risk of falls?: Yes Life alert?: No Use of a cane, walker or w/c?: Yes Grab bars in the bathroom?: Yes Shower chair or bench in shower?: Yes Elevated toilet seat or a handicapped toilet?: Yes  TIMED UP AND GO:  Was the test performed?  No    Cognitive Function:    09/04/2018    9:01 AM 09/01/2017    9:09 AM 08/29/2016    9:37 AM  MMSE - Mini Mental State Exam  Orientation to time 5 5 5   Orientation to Place 5 5 5   Registration 3 3 3   Attention/ Calculation 0 0 0  Recall 3 3 2   Recall-comments   pt is unable to recall 1 of 3 words  Language- name 2 objects 0 0 0  Language- repeat 1 1 1   Language- follow 3 step command 3 3 3   Language- read & follow direction 0 0 0  Write a sentence 0 0 0  Copy design 0 0 0  Total score 20 20 19         06/30/2023    3:11 PM 03/08/2022    1:14 PM  6CIT Screen  What Year? 0 points 0 points  What month? 0 points 0 points  What time? 0 points 0 points  Count back from 20 0 points 0 points  Months in reverse 4  points 0 points  Repeat phrase 10 points 0 points  Total Score 14 points 0 points    Immunizations Immunization History  Administered Date(s) Administered   Fluad Quad(high Dose 65+) 09/18/2020, 09/17/2022   H1N1 12/22/2008   Influenza Split 07/15/2012   Influenza Whole 10/04/2004, 08/01/2008, 09/12/2009   Influenza, High Dose Seasonal PF 08/18/2019, 08/12/2021   Influenza,inj,Quad PF,6+ Mos 07/27/2014, 08/29/2015, 08/29/2016, 09/01/2017, 09/04/2018   Influenza-Unspecified 08/31/2013   Moderna Sars-Covid-2 Vaccination 01/18/2020, 02/15/2020, 09/04/2020   Pneumococcal Conjugate-13 08/29/2015   Pneumococcal Polysaccharide-23 08/04/1996  Rsv, Bivalent, Protein Subunit Rsvpref,pf Verdis Frederickson) 11/13/2022   Td 02/02/1998, 06/23/2008   Zoster, Live 11/26/2007    TDAP status: Due, Education has been provided regarding the importance of this vaccine. Advised may receive this vaccine at local pharmacy or Health Dept. Aware to provide a copy of the vaccination record if obtained from local pharmacy or Health Dept. Verbalized acceptance and understanding.  Flu Vaccine status: Due, Education has been provided regarding the importance of this vaccine. Advised may receive this vaccine at local pharmacy or Health Dept. Aware to provide a copy of the vaccination record if obtained from local pharmacy or Health Dept. Verbalized acceptance and understanding.  Pneumococcal vaccine status: Up to date  Covid-19 vaccine status: Information provided on how to obtain vaccines.   Qualifies for Shingles Vaccine? Yes   Zostavax completed Yes   Shingrix Completed?: No.    Education has been provided regarding the importance of this vaccine. Patient has been advised to call insurance company to determine out of pocket expense if they have not yet received this vaccine. Advised may also receive vaccine at local pharmacy or Health Dept. Verbalized acceptance and understanding.  Screening Tests Health Maintenance   Topic Date Due   Zoster Vaccines- Shingrix (1 of 2) 07/10/1978   DTaP/Tdap/Td (3 - Tdap) 06/23/2018   OPHTHALMOLOGY EXAM  05/11/2022   COVID-19 Vaccine (4 - 2023-24 season) 07/05/2022   INFLUENZA VACCINE  06/05/2023   HEMOGLOBIN A1C  09/06/2023   FOOT EXAM  09/18/2023   Medicare Annual Wellness (AWV)  06/29/2024   Pneumonia Vaccine 109+ Years old  Completed   DEXA SCAN  Completed   HPV VACCINES  Aged Out    Health Maintenance  Health Maintenance Due  Topic Date Due   Zoster Vaccines- Shingrix (1 of 2) 07/10/1978   DTaP/Tdap/Td (3 - Tdap) 06/23/2018   OPHTHALMOLOGY EXAM  05/11/2022   COVID-19 Vaccine (4 - 2023-24 season) 07/05/2022   INFLUENZA VACCINE  06/05/2023    Colorectal cancer screening: No longer required.   Mammogram status: No longer required due to age.  Bone Density status: Completed 03/07/2014.   Lung Cancer Screening: (Low Dose CT Chest recommended if Age 50-80 years, 20 pack-year currently smoking OR have quit w/in 15years.) does not qualify.   Lung Cancer Screening Referral: no  Additional Screening:  Hepatitis C Screening: does not qualify;   Vision Screening: Recommended annual ophthalmology exams for early detection of glaucoma and other disorders of the eye. Is the patient up to date with their annual eye exam?  No  Who is the provider or what is the name of the office in which the patient attends annual eye exams? [facility] If pt is not established with a provider, would they like to be referred to a provider to establish care? No .   Dental Screening: Recommended annual dental exams for proper oral hygiene  Diabetic Foot Exam: Diabetic Foot Exam: Completed 09/17/2022  Community Resource Referral / Chronic Care Management: CRR required this visit?  No   CCM required this visit?  No     Plan:     I have personally reviewed and noted the following in the patient's chart:   Medical and social history Use of alcohol, tobacco or  illicit drugs  Current medications and supplements including opioid prescriptions. Patient is not currently taking opioid prescriptions. Functional ability and status Nutritional status Physical activity Advanced directives List of other physicians Hospitalizations, surgeries, and ER visits in previous 12 months Vitals Screenings  to include cognitive, depression, and falls Referrals and appointments  In addition, I have reviewed and discussed with patient certain preventive protocols, quality metrics, and best practice recommendations. A written personalized care plan for preventive services as well as general preventive health recommendations were provided to patient.     Barb Merino, LPN   [id number]/95/[id number]   After Visit Summary: (MyChart) Due to this being a telephonic visit, the after visit summary with patients personalized plan was offered to patient via MyChart   Nurse Notes: none

## 2023-06-30 NOTE — Progress Notes (Unsigned)
Care Coordination Call  Received message that patient needed assistance re-enrolling in [employer]. Completed via provider portal.   Approved for Cardiomyopathy Fund 06/19/23-06/17/24; BIN: [id number]; PCN: [id number]; Group: [id number], ID: [id number]  Called information to [facility], notified grandson.   Catie Eppie Gibson, PharmD, BCACP, CPP Clinical Pharmacist [facility] [phone]

## 2023-07-03 ENCOUNTER — Ambulatory Visit: Payer: Medicare HMO | Admitting: Family Medicine

## 2023-07-14 ENCOUNTER — Encounter: Payer: Self-pay | Admitting: Family Medicine

## 2023-07-14 ENCOUNTER — Ambulatory Visit (INDEPENDENT_AMBULATORY_CARE_PROVIDER_SITE_OTHER): Payer: Medicare HMO | Admitting: Family Medicine

## 2023-07-14 VITALS — BP 118/62 | HR 96 | Temp 98.4°F | Ht 63.0 in | Wt 183.0 lb

## 2023-07-14 DIAGNOSIS — Z23 Encounter for immunization: Secondary | ICD-10-CM | POA: Diagnosis not present

## 2023-07-14 DIAGNOSIS — D649 Anemia, unspecified: Secondary | ICD-10-CM

## 2023-07-14 DIAGNOSIS — I10 Essential (primary) hypertension: Secondary | ICD-10-CM | POA: Diagnosis not present

## 2023-07-14 DIAGNOSIS — Z7984 Long term (current) use of oral hypoglycemic drugs: Secondary | ICD-10-CM

## 2023-07-14 DIAGNOSIS — R413 Other amnesia: Secondary | ICD-10-CM | POA: Diagnosis not present

## 2023-07-14 DIAGNOSIS — E785 Hyperlipidemia, unspecified: Secondary | ICD-10-CM

## 2023-07-14 DIAGNOSIS — E119 Type 2 diabetes mellitus without complications: Secondary | ICD-10-CM | POA: Diagnosis not present

## 2023-07-14 MED ORDER — ROSUVASTATIN CALCIUM 10 MG PO TABS: 10.0000 mg | ORAL_TABLET | Freq: Every day | ORAL | 3 refills | Status: DC

## 2023-07-14 MED ORDER — MEMANTINE HCL 5 MG PO TABS: ORAL_TABLET | ORAL | Status: DC

## 2023-07-14 MED ORDER — EZETIMIBE 10 MG PO TABS: 10.0000 mg | ORAL_TABLET | Freq: Every day | ORAL | 3 refills | Status: DC

## 2023-07-14 MED ORDER — MEMANTINE HCL 10 MG PO TABS: ORAL_TABLET | ORAL | 3 refills | Status: DC

## 2023-07-14 NOTE — Patient Instructions (Addendum)
Recheck in about 4 months.  Labs/A1c at the visit.  Increase namenda to 10mg  AM 5mg  PM for 1 week.  Then 10mg  twice a day.  Go to the lab on the way out.   If you have mychart we'll likely use that to update you.    Please call cardiology about follow up.  [phone] Take care.  Glad to see you.

## 2023-07-14 NOTE — Progress Notes (Unsigned)
Diabetes:  Using medications without difficulties: yes Hypoglycemic episodes: no Hyperglycemic episodes: no Feet problems: some episodic altered sensation.  Blood Sugars averaging: not checked often eye exam within last year: due, d/w pt.   See notes on labs.  Elevated Cholesterol: Using medications without problems: yes Muscle aches:  no Diet compliance: yes Exercise: limited by baseline gait, walking with cane.    Hypertension:    Using medication without problems or lightheadedness: yes Chest pain with exertion: no Edema:occ BLE edema, mild Short of breath:no  Memory change.  On namenda.  Compliant.  Recheck memory today 25/30.  -2 for orientation, -3 for recall.  Living at home with her grandson, he is helping her with daily activities/supervision.  She is using cane at baseline.  Fall cautions d/w pt.    PMH and SH reviewed  Meds, vitals, and allergies reviewed.   ROS: Per HPI unless specifically indicated in ROS section   GEN: nad, alert and oriented except as above. HEENT: ncat NECK: supple w/o LA CV: rrr. PULM: ctab, no inc wob ABD: soft, +bs EXT: trace BLE edema SKIN: no acute rash  Diabetic foot exam: Normal inspection No skin breakdown No calluses  Normal DP pulses Normal sensation to light touch and monofilament except slightly dec monofilament sensation on the L foot Nails thickened

## 2023-07-15 LAB — COMPREHENSIVE METABOLIC PANEL
ALT: 11 U/L (ref 0–35)
AST: 15 U/L (ref 0–37)
Albumin: 3.7 g/dL (ref 3.5–5.2)
Alkaline Phosphatase: 74 U/L (ref 39–117)
BUN: 17 mg/dL (ref 6–23)
CO2: 31 meq/L (ref 19–32)
Calcium: 9.2 mg/dL (ref 8.4–10.5)
Chloride: 96 meq/L (ref 96–112)
Creatinine, Ser: 1.38 mg/dL — ABNORMAL HIGH (ref 0.40–1.20)
GFR: 32.65 mL/min — ABNORMAL LOW (ref >60.00)
Glucose, Bld: 242 mg/dL — ABNORMAL HIGH (ref 70–99)
Potassium: 4.1 meq/L (ref 3.5–5.1)
Sodium: 135 meq/L (ref 135–145)
Total Bilirubin: 0.4 mg/dL (ref 0.2–1.2)
Total Protein: 6.2 g/dL (ref 6.0–8.3)

## 2023-07-15 LAB — CBC WITH DIFFERENTIAL/PLATELET
Basophils Absolute: 0.1 10*3/uL (ref 0.0–0.1)
Basophils Relative: 1.2 % (ref 0.0–3.0)
Eosinophils Absolute: 0.3 10*3/uL (ref 0.0–0.7)
Eosinophils Relative: 3.8 % (ref 0.0–5.0)
HCT: 43.3 % (ref 36.0–46.0)
Hemoglobin: 14.2 g/dL (ref 12.0–15.0)
Lymphocytes Relative: 22.8 % (ref 12.0–46.0)
Lymphs Abs: 1.9 10*3/uL (ref 0.7–4.0)
MCHC: 32.8 g/dL (ref 30.0–36.0)
MCV: 88.7 fl (ref 78.0–100.0)
Monocytes Absolute: 0.7 10*3/uL (ref 0.1–1.0)
Monocytes Relative: 9.1 % (ref 3.0–12.0)
Neutro Abs: 5.1 10*3/uL (ref 1.4–7.7)
Neutrophils Relative %: 63.1 % (ref 43.0–77.0)
Platelets: 308 10*3/uL (ref 150.0–400.0)
RBC: 4.88 Mil/uL (ref 3.87–5.11)
RDW: 14.2 % (ref 11.5–15.5)
WBC: 8.1 10*3/uL (ref 4.0–10.5)

## 2023-07-15 LAB — FERRITIN: Ferritin: 39.5 ng/mL (ref 10.0–291.0)

## 2023-07-15 LAB — IRON: Iron: 46 ug/dL (ref 42–145)

## 2023-07-15 LAB — HEMOGLOBIN A1C: Hgb A1c MFr Bld: 7.9 % — ABNORMAL HIGH (ref 4.6–6.5)

## 2023-07-16 NOTE — Assessment & Plan Note (Signed)
MMSE 25/30 on 07/2023. -2 for orientation, -3 for recall.   Has help from her grandson at home.  Continue Namenda.  Would gradually increase to 10 mg twice a day.  See after visit summary.  Update me as needed.  Recheck periodically.

## 2023-07-16 NOTE — Assessment & Plan Note (Signed)
See notes on labs.  Goal to limit low sugars.  Continue Jardiance.

## 2023-07-16 NOTE — Assessment & Plan Note (Signed)
See notes on labs.  Continue Crestor and Zetia.

## 2023-07-16 NOTE — Assessment & Plan Note (Signed)
Continue furosemide if needed.  Continue hydrochlorothiazide losartan.  See notes on labs.

## 2023-07-23 DIAGNOSIS — D485 Neoplasm of uncertain behavior of skin: Secondary | ICD-10-CM | POA: Diagnosis not present

## 2023-07-23 DIAGNOSIS — Z85828 Personal history of other malignant neoplasm of skin: Secondary | ICD-10-CM | POA: Diagnosis not present

## 2023-07-23 DIAGNOSIS — C44622 Squamous cell carcinoma of skin of right upper limb, including shoulder: Secondary | ICD-10-CM | POA: Diagnosis not present

## 2023-07-23 DIAGNOSIS — L821 Other seborrheic keratosis: Secondary | ICD-10-CM | POA: Diagnosis not present

## 2023-09-04 DIAGNOSIS — C44622 Squamous cell carcinoma of skin of right upper limb, including shoulder: Secondary | ICD-10-CM | POA: Diagnosis not present

## 2023-09-04 DIAGNOSIS — D0461 Carcinoma in situ of skin of right upper limb, including shoulder: Secondary | ICD-10-CM | POA: Diagnosis not present

## 2023-10-22 DIAGNOSIS — D485 Neoplasm of uncertain behavior of skin: Secondary | ICD-10-CM | POA: Diagnosis not present

## 2023-10-22 DIAGNOSIS — L820 Inflamed seborrheic keratosis: Secondary | ICD-10-CM | POA: Diagnosis not present

## 2023-10-22 DIAGNOSIS — Z85828 Personal history of other malignant neoplasm of skin: Secondary | ICD-10-CM | POA: Diagnosis not present

## 2023-10-22 DIAGNOSIS — L570 Actinic keratosis: Secondary | ICD-10-CM | POA: Diagnosis not present

## 2023-10-22 DIAGNOSIS — L905 Scar conditions and fibrosis of skin: Secondary | ICD-10-CM | POA: Diagnosis not present

## 2023-10-22 DIAGNOSIS — C44622 Squamous cell carcinoma of skin of right upper limb, including shoulder: Secondary | ICD-10-CM | POA: Diagnosis not present

## 2023-10-22 DIAGNOSIS — L821 Other seborrheic keratosis: Secondary | ICD-10-CM | POA: Diagnosis not present

## 2023-12-16 ENCOUNTER — Other Ambulatory Visit: Payer: Self-pay | Admitting: Cardiovascular Disease

## 2024-01-13 ENCOUNTER — Other Ambulatory Visit: Payer: Self-pay | Admitting: Cardiovascular Disease

## 2024-01-20 DIAGNOSIS — C44722 Squamous cell carcinoma of skin of right lower limb, including hip: Secondary | ICD-10-CM | POA: Diagnosis not present

## 2024-01-20 DIAGNOSIS — L821 Other seborrheic keratosis: Secondary | ICD-10-CM | POA: Diagnosis not present

## 2024-01-20 DIAGNOSIS — L570 Actinic keratosis: Secondary | ICD-10-CM | POA: Diagnosis not present

## 2024-01-20 DIAGNOSIS — C44622 Squamous cell carcinoma of skin of right upper limb, including shoulder: Secondary | ICD-10-CM | POA: Diagnosis not present

## 2024-01-20 DIAGNOSIS — D485 Neoplasm of uncertain behavior of skin: Secondary | ICD-10-CM | POA: Diagnosis not present

## 2024-01-20 DIAGNOSIS — L905 Scar conditions and fibrosis of skin: Secondary | ICD-10-CM | POA: Diagnosis not present

## 2024-01-20 DIAGNOSIS — D692 Other nonthrombocytopenic purpura: Secondary | ICD-10-CM | POA: Diagnosis not present

## 2024-01-20 DIAGNOSIS — D0472 Carcinoma in situ of skin of left lower limb, including hip: Secondary | ICD-10-CM | POA: Diagnosis not present

## 2024-01-20 DIAGNOSIS — D0471 Carcinoma in situ of skin of right lower limb, including hip: Secondary | ICD-10-CM | POA: Diagnosis not present

## 2024-03-09 ENCOUNTER — Other Ambulatory Visit: Payer: Self-pay | Admitting: Cardiovascular Disease

## 2024-03-13 ENCOUNTER — Other Ambulatory Visit: Payer: Self-pay | Admitting: Family Medicine

## 2024-03-13 DIAGNOSIS — I5032 Chronic diastolic (congestive) heart failure: Secondary | ICD-10-CM

## 2024-03-13 DIAGNOSIS — I2510 Atherosclerotic heart disease of native coronary artery without angina pectoris: Secondary | ICD-10-CM

## 2024-03-13 DIAGNOSIS — I10 Essential (primary) hypertension: Secondary | ICD-10-CM

## 2024-03-18 ENCOUNTER — Encounter: Payer: Self-pay | Admitting: Pharmacist

## 2024-03-18 NOTE — Progress Notes (Signed)
 Pharmacy Quality Measure Review  This patient is appearing on a report for being at risk of failing the adherence measure for diabetes medications this calendar year.   Medication: Jardiance  10 mg  Incorrect attribution. Prescribed Jardiance  for heart failure indication, not diabetes.   However, looks as though she is out of refills due to loss to follow up with cardiology team. Last prescription contained the note: "Pt needs to schedule appt with provider for further refills - 3rd and final attempt "  Messaged patient via MyChart (grandson checks).

## 2024-03-22 DIAGNOSIS — D485 Neoplasm of uncertain behavior of skin: Secondary | ICD-10-CM | POA: Diagnosis not present

## 2024-03-22 DIAGNOSIS — C44629 Squamous cell carcinoma of skin of left upper limb, including shoulder: Secondary | ICD-10-CM | POA: Diagnosis not present

## 2024-03-22 DIAGNOSIS — D692 Other nonthrombocytopenic purpura: Secondary | ICD-10-CM | POA: Diagnosis not present

## 2024-03-22 DIAGNOSIS — L570 Actinic keratosis: Secondary | ICD-10-CM | POA: Diagnosis not present

## 2024-03-22 DIAGNOSIS — L905 Scar conditions and fibrosis of skin: Secondary | ICD-10-CM | POA: Diagnosis not present

## 2024-03-28 ENCOUNTER — Other Ambulatory Visit: Payer: Self-pay | Admitting: Cardiovascular Disease

## 2024-03-30 NOTE — Telephone Encounter (Signed)
 Pt of Dr. Alroy Aspen. She was last seen on 04/30/22. 28yr F/U. Passed her 3 attempts. Does Dr. Alroy Aspen want to refill? Please advise.

## 2024-03-30 NOTE — Telephone Encounter (Signed)
 Called pt's grandson TJ on file and scheduled to see Slater Duncan, NP on 04/19/24. One month supply sent to pharmacy on file to carry through to this appt.

## 2024-04-15 ENCOUNTER — Encounter (HOSPITAL_BASED_OUTPATIENT_CLINIC_OR_DEPARTMENT_OTHER): Payer: Self-pay

## 2024-04-16 NOTE — Progress Notes (Signed)
 Cardiology Office Note   Date:  04/19/2024  ID:  Cathy Larsen, DOB Sep 05, 1926, MRN [id number] PCP: Donnie Galea, MD  [facility] Providers Cardiologist:  Ahmad Alert, MD     PMH CAD Acute STEMI 11/10/2021 secondary to total proximal to mid occlusion of large dominant RCA pLAD 60% followed by focal 80% and 50% mid pLCx 90% followed by focal 85% and then total occlusion of dLCx after moderate sized marginal S/p PCI to RCA with 3.5 x 22 Medtronic Onyx Frontier stent Med Rx for residual disease Syncope Chronic HFpEF Diabetes CKD Stage 3b Hyperlipidemia Hypertension Statin intolerance  Previous cardiac cath in 1998 with 40% lesion in LAD.  On 11/10/2021 she presented to ED with arm pain/chest pain and STEMI was activated and route to hospital with EKG that revealed inferolateral findings consistent with STEMI.  Left heart cath revealed significant coronary disease as outlined above.  Echo revealed LVEF 60 to 65%, regional wall abnormality with mild hypokinesis of LV, basal inferior wall, mild LVH, indeterminate diastolic filling due to E-A fusion, normal RV, moderately elevated pulmonary pressure, moderate MR.  She was discharged 11/12/2021.  Unfortunately, she returned to the ED on 11/16/2021 with discomfort all over.  BNP was 1161.  Telemetry revealed sinus bradycardia with rates in the 30s and 40s and EP was consulted.  She had 2-1 AVB in the 40s with intermittent NSR.  She preferred to avoid PPM insertion.  Last clinic visit was 04/30/2022 at which time medical/conservative management was felt best course. She was advised to return in 1 year for follow-up.   History of Present Illness Discussed the use of AI scribe software for clinical note transcription with the patient, who gave verbal consent to proceed. History of Present Illness Cathy Larsen is a pleasant 89 year old female who is here with her grandson with whom she lives for follow-up of CAD. She answers questions but  does not provide any details or history. Her grandson is concerned about fluid intake and leg swelling. She experiences significant leg swelling at times causing tightness and difficulty walking. She is reluctant to wear 20-30 mmHg compression stockings, but occasionally will wear them. She is not very active at home. Her grandson gives her Lasix  only if he notes wheezing because it often causes hypotension.  She is also concerned that she does not drink enough water.  At times he has noted only one 8 ounce glass per day.  He feels that her responsiveness and energy levels are much better when she drinks more fluid.  No presyncope or syncope.  She sleeps with her legs hanging off the bed and he has been unable to get her to change this.  There is no evidence of orthopnea or PND.  She denies chest pain, palpitations. Wheezing occurs when swelling is severe. There is no pain, but mobility is affected. Fluid intake is inconsistent, sometimes as low as a glass of liquid per day, impacting her nutrition. She has low blood pressure, with readings as low as 98/58 mmHg. Her medication regimen includes Jardiance , Lasix  20 mg as needed, and hydrochlorothiazide  daily. Grandson occasionally withholds hydrochlorothiazide  if pt has had very little fluid intake due to concern for dehydration.   ROS: + bilateral LE edema  Studies Reviewed EKG Interpretation Date/Time:  Monday April 19 2024 14:08:08 EDT Ventricular Rate:  93 PR Interval:    QRS Duration:  82 QT Interval:  368 QTC Calculation: 457 R Axis:   24  Text Interpretation:  Accelerated Junctional rhythm Possible Anterolateral infarct , age undetermined When compared with ECG of 16-Nov-2021 10:55, PREVIOUS ECG IS PRESENT Confirmed by Slater Duncan [id number]) on 04/19/2024 2:26:20 PM     No results found for: LIPOA  Risk Assessment/Calculations          Physical Exam VS:  BP 128/62 (BP Location: Left Arm, Patient Position: Sitting, Cuff Size: Normal)    Pulse 92   Ht 5' 3 (1.6 m)   Wt 188 lb 6.4 oz (85.5 kg)   BMI 33.37 kg/m    Wt Readings from Last 3 Encounters:  04/19/24 188 lb 6.4 oz (85.5 kg)  07/14/23 183 lb (83 kg)  03/06/23 180 lb (81.6 kg)    GEN: Well nourished, well developed in no acute distress NECK: No JVD; No carotid bruits CARDIAC: RRR, no murmurs, rubs, gallops RESPIRATORY:  Clear to auscultation without rales, wheezing or rhonchi  ABDOMEN: Soft, non-tender, non-distended EXTREMITIES:  No edema; No deformity   Assessment & Plan CAD S/p STEMI 11/2021 with significant disease involving left coronary system with successful PCI to totally occluded RCA. She denies chest pain, dyspnea, or other symptoms concerning for angina.  No indication for further ischemic evaluation at this time. EKG today without concerning ST/T abnormality.  No bleeding concerns.  Continue aspirin , clopidogrel , ezetimibe , losartan , rosuvastatin .  Chronic HFpEF LVEF 60-65%, mild LVH, indeterminate diastolic filling due to E-A fusion, hypokinesis of LV, basal inferior wall on echo 11/2021 following STEMI. She continues to have chronic LE edema as noted below..  She denies shortness of breath, orthopnea, PND, but grandson reports occasional wheezing he feels it is secondary to fluid overload which is managed with Lasix  20 mg as needed. No acute concerns today.  We will check renal function to ensure stability.   Edema   Chronic leg edema likely multifactorial secondary to chronic venous insufficiency, diastolic dysfunction, sleeping position, and sedentary lifestyle. We cannot be too aggressive with diuretics due to hypotension and poor hydration. Encouraged consistent use of compression stockings and leg elevation. Monitor daily weight for fluid retention and continue as-needed Lasix  for weight gain, SOB, wheezing, or increased edema.   Bradycardia/Junctional rhythm   History of sinus bradycardia and heart block on prior telemetry. EKG today reveals  accelerated junctional rhythm. She is asymptomatic. We will see her back for soon follow-up.    Hypertension BP is well-controlled.  Grandson reports soft BP at home, particularly when given Lasix  for fluid overload. No change in anti-hypertensive therapy today.         Dispo: 3 months with me  Signed, Slater Duncan, NP-C

## 2024-04-19 ENCOUNTER — Encounter (HOSPITAL_BASED_OUTPATIENT_CLINIC_OR_DEPARTMENT_OTHER): Payer: Self-pay | Admitting: Nurse Practitioner

## 2024-04-19 ENCOUNTER — Ambulatory Visit (HOSPITAL_BASED_OUTPATIENT_CLINIC_OR_DEPARTMENT_OTHER): Admitting: Nurse Practitioner

## 2024-04-19 VITALS — BP 128/62 | HR 92 | Ht 63.0 in | Wt 188.4 lb

## 2024-04-19 DIAGNOSIS — I10 Essential (primary) hypertension: Secondary | ICD-10-CM | POA: Diagnosis not present

## 2024-04-19 DIAGNOSIS — I2510 Atherosclerotic heart disease of native coronary artery without angina pectoris: Secondary | ICD-10-CM | POA: Diagnosis not present

## 2024-04-19 DIAGNOSIS — R600 Localized edema: Secondary | ICD-10-CM

## 2024-04-19 DIAGNOSIS — I5032 Chronic diastolic (congestive) heart failure: Secondary | ICD-10-CM | POA: Diagnosis not present

## 2024-04-19 DIAGNOSIS — I498 Other specified cardiac arrhythmias: Secondary | ICD-10-CM

## 2024-04-19 MED ORDER — EMPAGLIFLOZIN 10 MG PO TABS: 10.0000 mg | ORAL_TABLET | Freq: Every day | ORAL | 3 refills | Status: AC

## 2024-04-19 MED ORDER — FUROSEMIDE 20 MG PO TABS: 20.0000 mg | ORAL_TABLET | ORAL | 6 refills | Status: AC | PRN

## 2024-04-19 NOTE — Patient Instructions (Signed)
 Medication Instructions:   Your physician recommends that you continue on your current medications as directed. Please refer to the Current Medication list given to you today.   *If you need a refill on your cardiac medications before your next appointment, please call your pharmacy*  Lab Work:  TODAY!!!! CBC/CMET  If you have labs (blood work) drawn today and your tests are completely normal, you will receive your results only by: MyChart Message (if you have MyChart) OR A paper copy in the mail If you have any lab test that is abnormal or we need to change your treatment, we will call you to review the results.  Testing/Procedures:  None ordered.   Follow-Up: At [facility], you and your health needs are our priority.  As part of our continuing mission to provide you with exceptional heart care, our providers are all part of one team.  This team includes your primary Cardiologist (physician) and Advanced Practice Providers or APPs (Physician Assistants and Nurse Practitioners) who all work together to provide you with the care you need, when you need it.  Your next appointment:   3 month(s)  Provider:   Slater Duncan, NP    We recommend signing up for the patient portal called MyChart.  Sign up information is provided on this After Visit Summary.  MyChart is used to connect with patients for Virtual Visits (Telemedicine).  Patients are able to view lab/test results, encounter notes, upcoming appointments, etc.  Non-urgent messages can be sent to your provider as well.   To learn more about what you can do with MyChart, go to [email or web address].

## 2024-04-20 ENCOUNTER — Ambulatory Visit (HOSPITAL_BASED_OUTPATIENT_CLINIC_OR_DEPARTMENT_OTHER): Payer: Self-pay | Admitting: Nurse Practitioner

## 2024-04-20 LAB — COMPREHENSIVE METABOLIC PANEL WITH GFR
ALT: 10 IU/L (ref 0–32)
AST: 13 IU/L (ref 0–40)
Albumin: 4.2 g/dL (ref 3.6–4.6)
Alkaline Phosphatase: 93 IU/L (ref 44–121)
BUN/Creatinine Ratio: 12 (ref 12–28)
BUN: 18 mg/dL (ref 10–36)
Bilirubin Total: 0.3 mg/dL (ref 0.0–1.2)
CO2: 24 mmol/L (ref 20–29)
Calcium: 9.7 mg/dL (ref 8.7–10.3)
Chloride: 96 mmol/L (ref 96–106)
Creatinine, Ser: 1.5 mg/dL — ABNORMAL HIGH (ref 0.57–1.00)
Globulin, Total: 2.1 g/dL (ref 1.5–4.5)
Glucose: 157 mg/dL — ABNORMAL HIGH (ref 70–99)
Potassium: 4.7 mmol/L (ref 3.5–5.2)
Sodium: 137 mmol/L (ref 134–144)
Total Protein: 6.3 g/dL (ref 6.0–8.5)
eGFR: 32 mL/min/{1.73_m2} — ABNORMAL LOW (ref >59)

## 2024-04-20 LAB — CBC
Hematocrit: 41 % (ref 34.0–46.6)
Hemoglobin: 13.1 g/dL (ref 11.1–15.9)
MCH: 29.3 pg (ref 26.6–33.0)
MCHC: 32 g/dL (ref 31.5–35.7)
MCV: 92 fL (ref 79–97)
Platelets: 291 10*3/uL (ref 150–450)
RBC: 4.47 x10E6/uL (ref 3.77–5.28)
RDW: 12.6 % (ref 11.7–15.4)
WBC: 11 10*3/uL — ABNORMAL HIGH (ref 3.4–10.8)

## 2024-04-21 ENCOUNTER — Ambulatory Visit: Payer: Self-pay

## 2024-04-21 ENCOUNTER — Ambulatory Visit
Admission: EM | Admit: 2024-04-21 | Discharge: 2024-04-21 | Disposition: A | Discharge status: Home / Self Care | Attending: Emergency Medicine | Admitting: Emergency Medicine

## 2024-04-21 ENCOUNTER — Encounter: Payer: Self-pay | Admitting: Family Medicine

## 2024-04-21 DIAGNOSIS — R350 Frequency of micturition: Secondary | ICD-10-CM

## 2024-04-21 DIAGNOSIS — K029 Dental caries, unspecified: Secondary | ICD-10-CM

## 2024-04-21 DIAGNOSIS — R630 Anorexia: Secondary | ICD-10-CM | POA: Diagnosis not present

## 2024-04-21 LAB — POCT URINALYSIS DIP (MANUAL ENTRY)
Bilirubin, UA: NEGATIVE
Blood, UA: NEGATIVE
Glucose, UA: 500 mg/dL — AB
Ketones, POC UA: NEGATIVE mg/dL
Leukocytes, UA: NEGATIVE
Nitrite, UA: NEGATIVE
Protein Ur, POC: NEGATIVE mg/dL
Spec Grav, UA: 1.01 (ref 1.010–1.025)
Urobilinogen, UA: 0.2 U/dL
pH, UA: 6.5 (ref 5.0–8.0)

## 2024-04-21 LAB — POCT FASTING CBG KUC MANUAL ENTRY: POCT Glucose (KUC): 235 mg/dL — AB (ref 70–99)

## 2024-04-21 MED ORDER — AMOXICILLIN 500 MG PO CAPS: 500.0000 mg | ORAL_CAPSULE | Freq: Two times a day (BID) | ORAL | 0 refills | Status: AC

## 2024-04-21 NOTE — Telephone Encounter (Signed)
 I am not in clinic today.  Please see about getting her scheduled today or tomorrow at clinic vs seen at [facility].  I would like her to get checked and hopefully get u/a and ucx done at the visit.

## 2024-04-21 NOTE — ED Provider Notes (Signed)
 Cathy Larsen    CSN: [id number] Arrival date & time: 04/21/24  1442      History   Chief Complaint Chief Complaint  Patient presents with   Dysuria    HPI Cathy Larsen is a 89 y.o. female.  Accompanied by her grandson, patient presents with increased confusion, decreased appetite, urinary frequency x 3 days.  No fever, cough, shortness of breath, dysuria.  She was seen by her cardiologist on 04/19/2024 and had blood work drawn at that visit; her white count was 11.0.  Her cardiologist and PCP wanted her to be checked for a UTI today.  Her PCP could not see her today and sent her here for urine check.  Patient's grandson also mentions that he noticed an odor coming from her mouth and that she has a decayed tooth.  She was recently seen by the dentist but they did not mention any concerns.   The history is provided by the patient, a relative and medical records.    Past Medical History:  Diagnosis Date   Arthritis    feet and hands   CAD (coronary artery disease)    cath 1998 with 40% lesion in LAD but no other disease   Carpal tunnel syndrome    Diabetes mellitus    Gout 03/1998   Hyperlipidemia 08/1997   Hypertension 09/23/1995   Skin cancer     Patient Active Problem List   Diagnosis Date Noted   Memory change 03/09/2023   Digital mucinous cyst of finger 09/18/2022   Skin irritation 06/19/2022   Delayed surgical wound healing 11/25/2021   Anemia 11/25/2021   Acute on chronic diastolic CHF (congestive heart failure) (HCC)    Acute CHF (congestive heart failure) (HCC) 11/16/2021   CHF (congestive heart failure) (HCC) 11/16/2021   AV heart block    ST elevation myocardial infarction involving right coronary artery (HCC)    Atypical chest pain 09/15/2019   Dysphasia 11/08/2018   Cough 11/08/2018   Healthcare maintenance 09/07/2017   Other social stressor 09/07/2017   Rash 09/07/2017   Murmur 03/05/2017   Carpal tunnel syndrome    Advance care planning  08/31/2015   Knee pain, right 09/20/2014   Medicare annual wellness visit, subsequent 01/21/2014   Cyst of finger 08/05/2013   RLS (restless legs syndrome) 01/12/2013   History of TIA (transient ischemic attack) 07/04/2012   Diabetes mellitus without complication (HCC) 07/04/2012   TIA (transient ischemic attack) 07/04/2012   Vertigo 05/19/2012   FATIGUE 12/25/2009   HEEL PAIN, BILATERAL 06/23/2008   HLD (hyperlipidemia) 05/13/2007   GOUT 05/13/2007   Essential hypertension 05/13/2007   Coronary atherosclerosis 05/13/2007   ANOMALY, CONGENITAL, BREAST NEC 05/13/2007   HX, PERSONAL, ARTHRITIS 05/13/2007   FIBROCYSTIC BREAST DISEASE, HX OF 05/13/2007    Past Surgical History:  Procedure Laterality Date   BIOPSY BREAST  03/2000   Ductogram right breast w/ biopsy- obstructing lesion benign by path. (Dr. Linder Revere)    BLADDER SURGERY     bladder tack    CARDIAC CATHETERIZATION  03/1997   40% occulsion lad    CARPAL TUNNEL RELEASE Right 03/14/2014   Procedure: RIGHT CARPAL TUNNEL RELEASE;  Surgeon: Milagros Alf, MD;  Location: [facility];  Service: Orthopedics;  Laterality: Right;   CATARACT EXTRACTION     CORONARY/GRAFT ACUTE MI REVASCULARIZATION N/A 11/10/2021   Procedure: Coronary/Graft Acute MI Revascularization;  Surgeon: Millicent Ally, MD;  Location[facility] CV LAB;  Service: Cardiovascular;  Laterality: N/A;   LEFT HEART CATH AND CORONARY ANGIOGRAPHY N/A 11/10/2021   Procedure: LEFT HEART CATH AND CORONARY ANGIOGRAPHY;  Surgeon: Millicent Ally, MD;  Location: [facility] INVASIVE CV LAB;  Service: Cardiovascular;  Laterality: N/A;   PARTIAL HYSTERECTOMY     VAGINAL DELIVERY     x 3   YAG LASER APPLICATION Right 04/04/2014   Procedure: YAG LASER APPLICATION;  Surgeon: Clay Cummins, MD;  Location: [facility];  Service: Ophthalmology;  Laterality: Right;    OB History   No obstetric history on file.      Home Medications    Prior to Admission medications   Medication  Sig Start Date End Date Taking? Authorizing Provider  amoxicillin (AMOXIL) 500 MG capsule Take 1 capsule (500 mg total) by mouth 2 (two) times daily for 7 days. 04/21/24 04/28/24 Yes Wellington Half, NP  Alpha-Lipoic Acid 300 MG CAPS Take one capsule daily. 03/10/23   Donnie Galea, MD  aspirin  EC 81 MG tablet TAKE 1 TABLET BY MOUTH EVERY DAY. SWALLOW WHOLE 04/18/23   Millicent Ally, MD  Carboxymethylcellulose Sodium (THERATEARS) 0.25 % SOLN Place 1 drop into both eyes at bedtime.    [provider]  cetirizine (ZYRTEC) 10 MG tablet Take 10 mg by mouth daily.    [provider]  clopidogrel  (PLAVIX ) 75 MG tablet TAKE 1 TABLET BY MOUTH EVERY DAY 03/16/24   Donnie Galea, MD  Coenzyme Q10 (CO Q-10) 100 MG CAPS Take 200 mg by mouth daily.    [provider]  diclofenac  Sodium (VOLTAREN ) 1 % GEL Apply 4 g topically 4 (four) times daily as needed (feet pain). 11/17/21   Hongalgi, Anand D, MD  empagliflozin  (JARDIANCE ) 10 MG TABS tablet Take 1 tablet (10 mg total) by mouth daily. 04/19/24   Swinyer, Leilani Punter, NP  ezetimibe  (ZETIA ) 10 MG tablet Take 1 tablet (10 mg total) by mouth daily. 07/14/23   Donnie Galea, MD  famotidine  (PEPCID ) 10 MG tablet Take 10 mg by mouth 2 (two) times daily.    [provider]  furosemide  (LASIX ) 20 MG tablet Take 1 tablet (20 mg total) by mouth as needed (For 3 pound weight gain in 24 hours or 5 pound weight gain in 1 week.). 04/19/24 04/19/25  Swinyer, Leilani Punter, NP  Ginger, Zingiber officinalis, (GINGER ROOT) 500 MG CAPS Take 500 mg by mouth at bedtime.    [provider]  glucose blood (ONE TOUCH ULTRA TEST) test strip USE DAILY TO CHECK SUGAR.  Dx 11.9. 07/15/22   Donnie Galea, MD  hydrochlorothiazide  (HYDRODIURIL ) 25 MG tablet TAKE 1 TABLET (25 MG TOTAL) BY MOUTH DAILY. 03/16/24   Donnie Galea, MD  losartan  (COZAAR ) 50 MG tablet Take 1 tablet (50 mg total) by mouth daily. 03/06/23   Donnie Galea, MD  memantine   (NAMENDA ) 10 MG tablet 1 TABLET TWICE DAILY 07/14/23   Donnie Galea, MD  Multiple Vitamin (MULTIVITAMIN WITH MINERALS) TABS Take 1 tablet by mouth at bedtime.    [provider]  nitroGLYCERIN  (NITROSTAT ) 0.4 MG SL tablet Place 1 tablet (0.4 mg total) under the tongue every 5 (five) minutes as needed for chest pain (max 3 tabs in 15 minutes). 11/23/21   Donnie Galea, MD  Omega-3 Fatty Acids (FISH OIL) 1200 MG CAPS Take 1,200 mg by mouth daily.    [provider]  potassium chloride  (KLOR-CON ) 10 MEQ tablet TAKE 1 TABLET BY MOUTH EVERY DAY  03/16/24   Donnie Galea, MD  rosuvastatin  (CRESTOR ) 10 MG tablet Take 1 tablet (10 mg total) by mouth daily. Patient not taking: Reported on 04/19/2024 07/14/23   Donnie Galea, MD  Turmeric 500 MG CAPS Take 500 mg by mouth at bedtime.    [provider]    Family History Family History  Problem Relation Age of Onset   Cancer Mother        breast    Kidney failure Mother    Cancer Father        colon    Cancer Brother        bladder    Cancer Brother        lung   Hypertension Sister    Hypertension Sister    Diabetes Neg Hx    Stroke Neg Hx     Social History Social History   Tobacco Use   Smoking status: Never   Smokeless tobacco: Never  Vaping Use   Vaping status: Never Used  Substance Use Topics   Alcohol  use: No    Alcohol /week: 0.0 standard drinks of alcohol    Drug use: No     Allergies   Statins, Beta adrenergic blockers, Keflex  [cephalexin ], and Requip  [ropinirole  hcl]   Review of Systems Review of Systems  Constitutional:  Positive for appetite change. Negative for chills and fever.  HENT:  Positive for dental problem. Negative for trouble swallowing.   Respiratory:  Negative for cough and shortness of breath.   Gastrointestinal:  Negative for abdominal pain, diarrhea and vomiting.  Genitourinary:  Positive for frequency. Negative for dysuria and hematuria.  Psychiatric/Behavioral:   Positive for confusion.      Physical Exam Triage Vital Signs ED Triage Vitals  Encounter Vitals Group     BP      Girls Systolic BP Percentile      Girls Diastolic BP Percentile      Boys Systolic BP Percentile      Boys Diastolic BP Percentile      Pulse      Resp      Temp      Temp src      SpO2      Weight      Height      Head Circumference      Peak Flow      Pain Score      Pain Loc      Pain Education      Exclude from Growth Chart    No data found.  Updated Vital Signs BP 121/67   Pulse 80   Temp (!) 97.3 F (36.3 C)   Resp 20   SpO2 98%   Visual Acuity Right Eye Distance:   Left Eye Distance:   Bilateral Distance:    Right Eye Near:   Left Eye Near:    Bilateral Near:     Physical Exam Constitutional:      General: She is not in acute distress. HENT:     Mouth/Throat:     Mouth: Mucous membranes are moist.     Pharynx: Oropharynx is clear.    Cardiovascular:     Rate and Rhythm: Normal rate and regular rhythm.     Heart sounds: Normal heart sounds.  Pulmonary:     Effort: Pulmonary effort is normal. No respiratory distress.     Breath sounds: Normal breath sounds.  Abdominal:     Palpations: Abdomen is soft.     Tenderness:  There is no abdominal tenderness. There is no right CVA tenderness, left CVA tenderness, guarding or rebound.   Neurological:     Mental Status: She is alert.   Psychiatric:        Behavior: Behavior is cooperative.     Comments: Cooperative with exam.      UC Treatments / Results  Labs (all labs ordered are listed, but only abnormal results are displayed) Labs Reviewed  POCT URINALYSIS DIP (MANUAL ENTRY) - Abnormal; Notable for the following components:      Result Value   Glucose, UA =500 (*)    All other components within normal limits  POCT FASTING CBG [facility] MANUAL ENTRY - Abnormal; Notable for the following components:   POCT Glucose ([facility]) 235 (*)    All other components within normal limits     EKG   Radiology No results found.  Procedures Procedures (including critical care time)  Medications Ordered in UC Medications - No data to display  Initial Impression / Assessment and Plan / UC Course  I have reviewed the triage vital signs and the nursing notes.  Pertinent labs & imaging results that were available during my care of the patient were reviewed by me and considered in my medical decision making (see chart for details).    Decreased appetite, dental decay, urinary frequency.  Afebrile and vital signs are stable.  No indication of urine infection at this time.  Patient has glucose in her urine but she is on Jardiance .  CBG 235 post prandial.  Her grandson is concerned about a dental infection as she has a broken decayed tooth.  Treating today with amoxicillin x 7 days.  Instructed grandson to follow-up with the patient's dentist.  Dental resource guide provided.  Instructed him to follow-up with her PCP tomorrow.  ED precautions given.  Patient's grandson agrees to plan of care.  Final Clinical Impressions(s) / UC Diagnoses   Final diagnoses:  Decreased appetite  Dental decay  Urinary frequency     Discharge Instructions      Follow up with your grandmother's primary care provider tomorrow.  Take her to the emergency department if she has worsening symptoms.    Her urine does not indicate infection at this time.  Her blood sugar was elevated at 235.    Please follow-up with her dentist about her dental decay.  Give her the amoxicillin twice a day for 7 days for dental infection.        ED Prescriptions     Medication Sig Dispense Auth. Provider   amoxicillin (AMOXIL) 500 MG capsule Take 1 capsule (500 mg total) by mouth 2 (two) times daily for 7 days. 14 capsule Wellington Half, NP      PDMP not reviewed this encounter.   Wellington Half, NP 04/21/24 [phone]

## 2024-04-21 NOTE — Telephone Encounter (Signed)
 Spoke with patients grandson to offer him an appointment tomorrow. He had already taken her to urgent care and they did a urinalysis and found nothing. He is still gong to keep the appointment for 6/23 to discuss her other labs and her mental status

## 2024-04-21 NOTE — ED Triage Notes (Signed)
 Patient to Urgent Care with grandson, complaints of urinary frequency/ confusion/ poor appetite.   Symptoms x3 days.  Confusion last night- reports she forgot she takes medication at night.

## 2024-04-21 NOTE — Telephone Encounter (Signed)
 FYI Only or Action Required?: Action required by provider  Patient was last seen in primary care on 07/14/2023 by Donnie Galea, MD. Called Nurse Triage reporting Altered Mental Status, elevated WBC, Fatigue, Agitation, and not wanting to eat or drink. Symptoms began several days ago. Interventions attempted: Rest, hydration, or home remedies. Symptoms are: rapidly worsening.  Triage Disposition: Go to ED Now (Notify PCP)  Patient/caregiver understands and will follow disposition?: No, refuses disposition - Requesting call back to grandson [place]  [place] [phone]      Copied from CRM [id number]. Topic: Clinical - Red Word Triage >> Apr 21, 2024  8:22 AM Alyse July wrote: Red Word that prompted transfer to Nurse Triage: altered mental status,elevated WBC, Possible UTI Reason for Disposition  Very strange or paranoid behavior  Answer Assessment - Initial Assessment Questions Pt's friend Daren Eck with pt's grandson [place]  Gilles in background  1. LEVEL OF CONSCIOUSNESS: How is he (she, the patient) acting right now? (e.g., alert-oriented, confused, lethargic, stuporous, comatose)     Last night around her bedtime she was completely altered to the point she didn't even realize that she takes bedtime meds, we finally convinced her to take meds, she didn't remember going to cardio day before or having labwork done at all, WBC elevated a little, don't know if UTI or something else going on, not herself past few days, not wanting to eat or drink, getting worse 2. ONSET: When did the confusion start?  (minutes, hours, days)    Sunday, but worsening especially last night 3. PATTERN Does this come and go, or has it been constant since it started?  Is it present now?     Getting worse if anything, been constant but ebbs and flows 4. ALCOHOL  or DRUGS: Has he been drinking alcohol  or taking any drugs?      no 5. NARCOTIC MEDICINES: Has he been receiving any narcotic medications? (e.g.,  morphine, Vicodin)     no 6. CAUSE: What do you think is causing the confusion?      Thinking UTI or something else going on 7. OTHER SYMPTOMS: Are there any other symptoms? (e.g., difficulty breathing, headache, fever, weakness)     No SOB, headache, fever, hallucinations, clammy, or vomiting maybe just more tired but not really weak Blood sugar's been okay, checked it several times No slurred speech or one-sided weakness/numbness Not necessarily a risk to others but very upset last night, very abnormal for her, very unusual New to her, no hx of confusion Call grandson [phone] Pt currently sleeping  Refusing ED at this time, requesting call back to grandson for further recommendations/appt options, sending message, advised hospital if any worsening or new symptoms  Protocols used: Confusion - Delirium-A-AH

## 2024-04-21 NOTE — Telephone Encounter (Signed)
 Spoke with patients grandson who states that on Sunday she began to have more confusion. She had an appointment yesterday at cardiology and the did lab work. The lab indicated elevated white blood count as well as high creatine levels. The told them that she may have a uti. Currently she is scheduled to be seen on 6/23 but they are asking for a urine test prior. Please advise

## 2024-04-21 NOTE — Discharge Instructions (Addendum)
 Follow up with your grandmother's primary care provider tomorrow.  Take her to the emergency department if she has worsening symptoms.    Her urine does not indicate infection at this time.  Her blood sugar was elevated at 235.    Please follow-up with her dentist about her dental decay.  Give her the amoxicillin twice a day for 7 days for dental infection.

## 2024-04-21 NOTE — Telephone Encounter (Signed)
 Noted. Thanks.

## 2024-04-26 ENCOUNTER — Encounter: Payer: Self-pay | Admitting: Family Medicine

## 2024-04-26 ENCOUNTER — Ambulatory Visit (INDEPENDENT_AMBULATORY_CARE_PROVIDER_SITE_OTHER): Admitting: Family Medicine

## 2024-04-26 VITALS — BP 132/80 | HR 91 | Temp 98.1°F | Ht 63.0 in | Wt 188.0 lb

## 2024-04-26 DIAGNOSIS — R413 Other amnesia: Secondary | ICD-10-CM

## 2024-04-26 DIAGNOSIS — K0381 Cracked tooth: Secondary | ICD-10-CM | POA: Insufficient documentation

## 2024-04-26 DIAGNOSIS — I5032 Chronic diastolic (congestive) heart failure: Secondary | ICD-10-CM

## 2024-04-26 DIAGNOSIS — I10 Essential (primary) hypertension: Secondary | ICD-10-CM | POA: Diagnosis not present

## 2024-04-26 DIAGNOSIS — I2510 Atherosclerotic heart disease of native coronary artery without angina pectoris: Secondary | ICD-10-CM | POA: Diagnosis not present

## 2024-04-26 MED ORDER — HYDROCHLOROTHIAZIDE 25 MG PO TABS
25.0000 mg | ORAL_TABLET | Freq: Every day | ORAL | Status: AC | PRN
Start: 2024-04-26 — End: ?

## 2024-04-26 NOTE — Assessment & Plan Note (Signed)
 Cathy Larsen is going to call dental clinic today.  Continue Amoxil in the meantime.

## 2024-04-26 NOTE — Assessment & Plan Note (Signed)
 With recent episodic confusion, in the context of gradual memory changes noted by family at home.  Confusion was nocturnal.  Unclear if this is an exacerbation of primary memory loss and/or exacerbated by potential dental infection/concurrent illness.  I think it makes sense to follow-up with the dental clinic when possible, finish the Amoxil prescription and then update me as needed.  Based on her exam it does not appear that she needs acute intervention regarding memory.  Grandson agrees.  Cathy Larsen is still monitoring patient at home.  Discussed long-term considerations.  Patient would prefer to stay at home.  Discussed palliative care (not hospice) referral along with social work referral regarding care for or family members with memory loss.  Both ordered.

## 2024-04-26 NOTE — Assessment & Plan Note (Signed)
 Potentially overtreated, especially given the heat.  Would hold Lasix  and hydrochlorothiazide  for now.  Patient/grandson can update me as needed.

## 2024-04-26 NOTE — Patient Instructions (Addendum)
 You should get a call from palliative care and social work.  Finish amoxil, please call the dental clinic.  Update me as needed, especially if more confusion.   I would hold lasix  and hydrochlorothiazide  for now.  Take care.  Glad to see you.

## 2024-04-26 NOTE — Progress Notes (Signed)
 She was feeling well on the way to clinic.  Getting out of the car she felt swimmy headed.  H/o similar prev, ie heat related.  Temp near 100 deg today.  She was able to walk into the clinic.  No syncope.  Still on amoxil.  Still on jardiance .  No recent lasix  use.  Taking hydrochlorothiazide  prn, used yesterday for leg swelling.  Unclear if med use contributes to sx above.   Leg cramping improved off crestor .    Not burning with urination.  Urinary frequency is not going on now.  On amoxil for for dental infection.  Family is going to check with dental clinic today.  Not SOB, no pain today.  No CP.    She was not enthused about wearing compression stockings for BLE edema.  Discussed.  She had an episode of confusion last night and the week prior.  She had less PO intake overall with more memory changes noted.    Meds, vitals, and allergies reviewed.   ROS: Per HPI unless specifically indicated in ROS section   Nad Ncat MMM OP wnl except for R lower molar cracked w/o fluctant mass seen on the gumline.  Rrr Ctab Abd soft, not ttp Skin well-perfused 1+ BLE edema.    D/w pt about palliative care referral.  SW referral.  H/o memory loss.  Her preference is to be at home.

## 2024-04-29 ENCOUNTER — Other Ambulatory Visit: Payer: Self-pay | Admitting: Family Medicine

## 2024-05-03 ENCOUNTER — Telehealth: Payer: Self-pay

## 2024-05-03 NOTE — Progress Notes (Signed)
 Complex Care Management Note Care Guide Note  05/03/2024 Name: Cathy Larsen MRN: [id number] DOB: [birth year]   Complex Care Management Outreach Attempts: An unsuccessful telephone outreach was attempted today to offer the patient information about available complex care management services.  Follow Up Plan:  Additional outreach attempts will be made to offer the patient complex care management information and services.   Encounter Outcome:  No Answer  D[facility]  [facility], [facility] Health Care Management Assistant Direct Dial: [phone]  Fax: [phone]

## 2024-05-06 NOTE — Progress Notes (Signed)
 Complex Care Management Note Care Guide Note  05/06/2024 Name: Cathy Larsen MRN: [id number] DOB: [birth year]   Complex Care Management Outreach Attempts: A second unsuccessful outreach was attempted today to offer the patient with information about available complex care management services.  Follow Up Plan:  Additional outreach attempts will be made to offer the patient complex care management information and services.   Encounter Outcome:  No Answer  Dreama [facility]  [facility], [facility] Health Care Management Assistant Direct Dial: [phone]  Fax: [phone]

## 2024-05-14 NOTE — Progress Notes (Signed)
 Complex Care Management Note Care Guide Note  05/14/2024 Name: Cathy Larsen MRN: [id number] DOB: 12-04-1925   Complex Care Management Outreach Attempts: A third unsuccessful outreach was attempted today to offer the patient with information about available complex care management services.  Follow Up Plan:  No further outreach attempts will be made at this time. We have been unable to contact the patient to offer or enroll patient in complex care management services.  Encounter Outcome:  No Answer  Dream[facility]  [facility], [facility] Health Care Management Assistant Direct Dial: [phone]  Fax: [phone]

## 2024-06-13 ENCOUNTER — Other Ambulatory Visit: Payer: Self-pay | Admitting: Family Medicine

## 2024-06-21 DIAGNOSIS — D692 Other nonthrombocytopenic purpura: Secondary | ICD-10-CM | POA: Diagnosis not present

## 2024-06-21 DIAGNOSIS — L905 Scar conditions and fibrosis of skin: Secondary | ICD-10-CM | POA: Diagnosis not present

## 2024-06-21 DIAGNOSIS — L570 Actinic keratosis: Secondary | ICD-10-CM | POA: Diagnosis not present

## 2024-06-21 DIAGNOSIS — L821 Other seborrheic keratosis: Secondary | ICD-10-CM | POA: Diagnosis not present

## 2024-07-11 NOTE — Progress Notes (Deleted)
 Cardiology Office Note   Date:  07/11/2024  ID:  Cathy Larsen, DOB 1926-02-01, MRN [id number] PCP: Cleatus Arlyss RAMAN, MD  [facility] Providers Cardiologist:  Aleene Passe, MD (Inactive)     PMH CAD Acute STEMI 11/10/2021 secondary to total proximal to mid occlusion of large dominant RCA pLAD 60% followed by focal 80% and 50% mid pLCx 90% followed by focal 85% and then total occlusion of dLCx after moderate sized marginal S/p PCI to RCA with 3.5 x 22 Medtronic Onyx Frontier stent Med Rx for residual disease Syncope Chronic HFpEF Diabetes CKD Stage 3b Hyperlipidemia Hypertension Statin intolerance  Previous cardiac cath in 1998 with 40% lesion in LAD.  On 11/10/2021 she presented to ED with arm pain/chest pain and STEMI was activated and route to hospital with EKG that revealed inferolateral findings consistent with STEMI.  Left heart cath revealed significant coronary disease as outlined above.  Echo revealed LVEF 60 to 65%, regional wall abnormality with mild hypokinesis of LV, basal inferior wall, mild LVH, indeterminate diastolic filling due to E-A fusion, normal RV, moderately elevated pulmonary pressure, moderate MR.  She was discharged 11/12/2021.  Unfortunately, she returned to the ED on 11/16/2021 with discomfort all over.  BNP was 1161.  Telemetry revealed sinus bradycardia with rates in the 30s and 40s and EP was consulted.  She had 2-1 AVB in the 40s with intermittent NSR.  She preferred to avoid PPM insertion.  Seen in clinic 04/30/2022 at which time medical/conservative management was felt best course. She was advised to return in 1 year for follow-up.   Seen by me on 04/19/24 accompanied by her grandson with whom she lives.  She answered questions but did not provide details or history. Grandson concerned about fluid intake and leg swelling. Experiencing significant leg swelling at times causing tightness and difficulty walking. Is reluctant to wear 20-30 mmHg compression  stockings, but occasionally will wear them. She is not very active at home. Her grandson gives her Lasix  only if he notes wheezing because it often causes hypotension. He was concerned she does not drink enough water, possibly as little as 8 oz per day. Feels that her responsiveness and energy levels are much better when she drinks more fluid.  No presyncope or syncope.  She sleeps with her legs hanging off the bed and he has been unable to get her to change this. No evidence of orthopnea or PND.  She denies chest pain, palpitations. Wheezing occurs when swelling is severe. Low blood pressure, with readings as low as 98/58 mmHg. Her medication regimen includes Jardiance , Lasix  20 mg as needed, and hydrochlorothiazide  daily. Grandson occasionally withholds hydrochlorothiazide  if pt has had very little fluid intake due to concern for dehydration. EKG revealed accelerated junctional rhythm at 93 bpm, she was asymptomatic.   History of Present Illness Discussed the use of AI scribe software for clinical note transcription with the patient, who gave verbal consent to proceed. History of Present Illness Cathy Larsen is a pleasant 89 year old female who is here with her grandson with whom she lives for follow-up of CAD.   ROS: + bilateral LE edema***  Studies Reviewed       No results found for: LIPOA  Risk Assessment/Calculations  No BP recorded.  {Refresh Note OR Click here to enter BP  :1}***       Physical Exam VS:  There were no vitals taken for this visit.   Wt Readings from Last 3 Encounters:  04/26/24 188 lb (85.3 kg)  04/19/24 188 lb 6.4 oz (85.5 kg)  07/14/23 183 lb (83 kg)    GEN: Well nourished, well developed in no acute distress NECK: No JVD; No carotid bruits CARDIAC: RRR, no murmurs, rubs, gallops RESPIRATORY:  Clear to auscultation without rales, wheezing or rhonchi  ABDOMEN: Soft, non-tender, non-distended EXTREMITIES:  No edema; No deformity   Assessment &  Plan CAD S/p STEMI 11/2021 with significant disease involving left coronary system with successful PCI to totally occluded RCA. She denies chest pain, dyspnea, or other symptoms concerning for angina.  No indication for further ischemic evaluation at this time. EKG today without concerning ST/T abnormality.  No bleeding concerns.  Continue aspirin , clopidogrel , ezetimibe , losartan , rosuvastatin .  Chronic HFpEF LVEF 60-65%, mild LVH, indeterminate diastolic filling due to E-A fusion, hypokinesis of LV, basal inferior wall on echo 11/2021 following STEMI. She continues to have chronic LE edema as noted below..  She denies shortness of breath, orthopnea, PND, but grandson reports occasional wheezing he feels it is secondary to fluid overload which is managed with Lasix  20 mg as needed. No acute concerns today.  We will check renal function to ensure stability.   Edema   Chronic leg edema likely multifactorial secondary to chronic venous insufficiency, diastolic dysfunction, sleeping position, and sedentary lifestyle. We cannot be too aggressive with diuretics due to hypotension and poor hydration. Encouraged consistent use of compression stockings and leg elevation. Monitor daily weight for fluid retention and continue as-needed Lasix  for weight gain, SOB, wheezing, or increased edema.   Bradycardia/Junctional rhythm   History of sinus bradycardia and heart block on prior telemetry. EKG today reveals accelerated junctional rhythm. She is asymptomatic. We will see her back for soon follow-up.    Hypertension BP is well-controlled.  Grandson reports soft BP at home, particularly when given Lasix  for fluid overload. No change in anti-hypertensive therapy today.         Dispo: *** Rosaline Bane, NP-C

## 2024-07-12 ENCOUNTER — Ambulatory Visit (HOSPITAL_BASED_OUTPATIENT_CLINIC_OR_DEPARTMENT_OTHER): Admitting: Nurse Practitioner

## 2024-09-15 ENCOUNTER — Other Ambulatory Visit: Payer: Self-pay | Admitting: Family Medicine

## 2024-09-15 DIAGNOSIS — E785 Hyperlipidemia, unspecified: Secondary | ICD-10-CM

## 2024-09-15 DIAGNOSIS — R413 Other amnesia: Secondary | ICD-10-CM

## 2024-10-18 ENCOUNTER — Encounter: Payer: Self-pay | Admitting: Pharmacist

## 2024-10-18 NOTE — Progress Notes (Signed)
 [employer] - Re-enrollment   Medication(s): All heart failure/cardiomyopathy medications (brand meds prescribed = Jardiance )   Currently Enrolled: Expired Aug 2025   Application Status:  Approved for re-enrollment    [employer] ID: [id number] Fund: Hypercholesterolemia - Medicare Access Assistance Type: Co-pay Start Date: 09/18/2024 End Date: 09/17/2025               Rx Card: Card No.  [id number] RX BIN:  [id number] PCN:  [id number] Group:  [id number]    Called patient's pharmacy and provided new grant information. They confirm $0 copay.    Manuelita FABIENE Kobs, PharmD Clinical Pharmacist [facility] [phone]
